# Patient Record
Sex: Female | Born: 1980 | Race: White | Hispanic: No | Marital: Married
Health system: Southern US, Community
[De-identification: ages and names within clinical notes are randomized; demographics above are authoritative.]

## PROBLEM LIST (undated history)

## (undated) DIAGNOSIS — R7301 Impaired fasting glucose: Secondary | ICD-10-CM

## (undated) DIAGNOSIS — I10 Essential (primary) hypertension: Secondary | ICD-10-CM

## (undated) DIAGNOSIS — E785 Hyperlipidemia, unspecified: Secondary | ICD-10-CM

## (undated) DIAGNOSIS — R740 Nonspecific elevation of levels of transaminase and lactic acid dehydrogenase [LDH]: Secondary | ICD-10-CM

## (undated) DIAGNOSIS — E669 Obesity, unspecified: Secondary | ICD-10-CM

## (undated) DIAGNOSIS — F32A Depression, unspecified: Secondary | ICD-10-CM

## (undated) DIAGNOSIS — G8929 Other chronic pain: Secondary | ICD-10-CM

## (undated) DIAGNOSIS — I679 Cerebrovascular disease, unspecified: Secondary | ICD-10-CM

## (undated) DIAGNOSIS — F329 Major depressive disorder, single episode, unspecified: Secondary | ICD-10-CM

## (undated) DIAGNOSIS — I639 Cerebral infarction, unspecified: Secondary | ICD-10-CM

## (undated) DIAGNOSIS — G4733 Obstructive sleep apnea (adult) (pediatric): Secondary | ICD-10-CM

## (undated) DIAGNOSIS — G709 Myoneural disorder, unspecified: Secondary | ICD-10-CM

## (undated) DIAGNOSIS — F419 Anxiety disorder, unspecified: Secondary | ICD-10-CM

## (undated) DIAGNOSIS — K2210 Ulcer of esophagus without bleeding: Secondary | ICD-10-CM

## (undated) DIAGNOSIS — K219 Gastro-esophageal reflux disease without esophagitis: Secondary | ICD-10-CM

## (undated) DIAGNOSIS — M549 Dorsalgia, unspecified: Secondary | ICD-10-CM

## (undated) DIAGNOSIS — R011 Cardiac murmur, unspecified: Secondary | ICD-10-CM

## (undated) HISTORY — DX: Other chronic pain: G89.29

## (undated) HISTORY — DX: Gastro-esophageal reflux disease without esophagitis: K21.9

## (undated) HISTORY — DX: Essential (primary) hypertension: I10

## (undated) HISTORY — DX: Cerebral infarction, unspecified: I63.9

## (undated) HISTORY — DX: Obstructive sleep apnea (adult) (pediatric): G47.33

## (undated) HISTORY — DX: Dorsalgia, unspecified: M54.9

## (undated) HISTORY — DX: Anxiety disorder, unspecified: F41.9

## (undated) HISTORY — DX: Depression, unspecified: F32.A

## (undated) HISTORY — DX: Cardiac murmur, unspecified: R01.1

## (undated) HISTORY — DX: Ulcer of esophagus without bleeding: K22.10

## (undated) HISTORY — DX: Obesity, unspecified: E66.9

## (undated) HISTORY — DX: Major depressive disorder, single episode, unspecified: F32.9

## (undated) HISTORY — DX: Nonspecific elevation of levels of transaminase and lactic acid dehydrogenase (ldh): R74.0

## (undated) HISTORY — DX: Impaired fasting glucose: R73.01

## (undated) HISTORY — DX: Cerebrovascular disease, unspecified: I67.9

## (undated) HISTORY — DX: Myoneural disorder, unspecified: G70.9

## (undated) HISTORY — DX: Hyperlipidemia, unspecified: E78.5

---

## 2000-08-01 ENCOUNTER — Encounter: Payer: Self-pay | Admitting: Surgery

## 2000-08-01 ENCOUNTER — Observation Stay (HOSPITAL_COMMUNITY): Admission: EM | Admit: 2000-08-01 | Discharge: 2000-08-02 | Payer: Self-pay

## 2000-12-27 ENCOUNTER — Ambulatory Visit (HOSPITAL_COMMUNITY): Admission: RE | Admit: 2000-12-27 | Discharge: 2000-12-27 | Payer: Self-pay | Admitting: Internal Medicine

## 2000-12-27 ENCOUNTER — Encounter: Payer: Self-pay | Admitting: Internal Medicine

## 2001-04-29 ENCOUNTER — Ambulatory Visit (HOSPITAL_COMMUNITY): Admission: RE | Admit: 2001-04-29 | Discharge: 2001-04-29 | Payer: Self-pay | Admitting: Family Medicine

## 2001-05-02 ENCOUNTER — Encounter: Payer: Self-pay | Admitting: Family Medicine

## 2001-05-02 ENCOUNTER — Ambulatory Visit (HOSPITAL_COMMUNITY): Admission: RE | Admit: 2001-05-02 | Discharge: 2001-05-02 | Payer: Self-pay | Admitting: Family Medicine

## 2001-07-01 ENCOUNTER — Encounter: Payer: Self-pay | Admitting: Family Medicine

## 2001-07-01 ENCOUNTER — Ambulatory Visit (HOSPITAL_COMMUNITY): Admission: RE | Admit: 2001-07-01 | Discharge: 2001-07-01 | Payer: Self-pay | Admitting: Family Medicine

## 2002-04-18 ENCOUNTER — Encounter (HOSPITAL_COMMUNITY): Admission: RE | Admit: 2002-04-18 | Discharge: 2002-05-18 | Payer: Self-pay | Admitting: Preventative Medicine

## 2002-05-02 ENCOUNTER — Ambulatory Visit (HOSPITAL_COMMUNITY): Admission: RE | Admit: 2002-05-02 | Discharge: 2002-05-02 | Payer: Self-pay | Admitting: Preventative Medicine

## 2002-05-02 ENCOUNTER — Encounter: Payer: Self-pay | Admitting: Preventative Medicine

## 2002-10-18 ENCOUNTER — Encounter (HOSPITAL_COMMUNITY): Admission: RE | Admit: 2002-10-18 | Discharge: 2002-11-17 | Payer: Self-pay | Admitting: Internal Medicine

## 2003-06-29 ENCOUNTER — Emergency Department (HOSPITAL_COMMUNITY): Admission: EM | Admit: 2003-06-29 | Discharge: 2003-06-29 | Payer: Self-pay | Admitting: Emergency Medicine

## 2003-07-04 ENCOUNTER — Ambulatory Visit (HOSPITAL_COMMUNITY): Admission: RE | Admit: 2003-07-04 | Discharge: 2003-07-04 | Payer: Self-pay | Admitting: Family Medicine

## 2003-07-24 ENCOUNTER — Ambulatory Visit (HOSPITAL_COMMUNITY): Admission: RE | Admit: 2003-07-24 | Discharge: 2003-07-24 | Payer: Self-pay | Admitting: Pulmonary Disease

## 2003-09-06 ENCOUNTER — Ambulatory Visit (HOSPITAL_COMMUNITY): Admission: RE | Admit: 2003-09-06 | Discharge: 2003-09-06 | Payer: Self-pay | Admitting: Pulmonary Disease

## 2004-04-04 ENCOUNTER — Ambulatory Visit (HOSPITAL_COMMUNITY): Admission: RE | Admit: 2004-04-04 | Discharge: 2004-04-04 | Payer: Self-pay | Admitting: Internal Medicine

## 2004-04-17 ENCOUNTER — Ambulatory Visit (HOSPITAL_COMMUNITY): Admission: RE | Admit: 2004-04-17 | Discharge: 2004-04-17 | Payer: Self-pay | Admitting: Internal Medicine

## 2004-04-22 ENCOUNTER — Ambulatory Visit (HOSPITAL_COMMUNITY): Admission: RE | Admit: 2004-04-22 | Discharge: 2004-04-22 | Payer: Self-pay | Admitting: Internal Medicine

## 2004-11-20 ENCOUNTER — Ambulatory Visit (HOSPITAL_COMMUNITY): Admission: RE | Admit: 2004-11-20 | Discharge: 2004-11-20 | Payer: Self-pay | Admitting: Internal Medicine

## 2006-08-31 ENCOUNTER — Emergency Department (HOSPITAL_COMMUNITY): Admission: EM | Admit: 2006-08-31 | Discharge: 2006-08-31 | Payer: Self-pay | Admitting: Emergency Medicine

## 2007-06-11 DIAGNOSIS — I679 Cerebrovascular disease, unspecified: Secondary | ICD-10-CM

## 2007-06-11 HISTORY — DX: Cerebrovascular disease, unspecified: I67.9

## 2007-06-13 ENCOUNTER — Ambulatory Visit: Payer: Self-pay | Admitting: Cardiology

## 2007-06-13 ENCOUNTER — Inpatient Hospital Stay (HOSPITAL_COMMUNITY): Admission: EM | Admit: 2007-06-13 | Discharge: 2007-06-16 | Payer: Self-pay | Admitting: Emergency Medicine

## 2007-07-03 DIAGNOSIS — I639 Cerebral infarction, unspecified: Secondary | ICD-10-CM

## 2007-09-05 ENCOUNTER — Emergency Department (HOSPITAL_COMMUNITY): Admission: EM | Admit: 2007-09-05 | Discharge: 2007-09-05 | Payer: Self-pay | Admitting: Emergency Medicine

## 2007-10-19 ENCOUNTER — Encounter (HOSPITAL_COMMUNITY): Admission: RE | Admit: 2007-10-19 | Discharge: 2007-11-18 | Payer: Self-pay | Admitting: Unknown Physician Specialty

## 2007-11-14 ENCOUNTER — Ambulatory Visit (HOSPITAL_COMMUNITY): Admission: RE | Admit: 2007-11-14 | Discharge: 2007-11-14 | Payer: Self-pay | Admitting: Internal Medicine

## 2008-07-02 ENCOUNTER — Ambulatory Visit: Payer: Self-pay | Admitting: Cardiology

## 2008-07-17 ENCOUNTER — Ambulatory Visit: Payer: Self-pay | Admitting: *Deleted

## 2008-10-02 ENCOUNTER — Ambulatory Visit: Payer: Self-pay | Admitting: Vascular Surgery

## 2008-10-08 ENCOUNTER — Ambulatory Visit (HOSPITAL_COMMUNITY): Admission: RE | Admit: 2008-10-08 | Discharge: 2008-10-08 | Payer: Self-pay | Admitting: Vascular Surgery

## 2008-10-08 ENCOUNTER — Ambulatory Visit: Payer: Self-pay | Admitting: Vascular Surgery

## 2009-01-03 ENCOUNTER — Ambulatory Visit (HOSPITAL_COMMUNITY): Admission: RE | Admit: 2009-01-03 | Discharge: 2009-01-03 | Payer: Self-pay | Admitting: Cardiology

## 2009-01-03 ENCOUNTER — Encounter (INDEPENDENT_AMBULATORY_CARE_PROVIDER_SITE_OTHER): Payer: Self-pay | Admitting: Cardiology

## 2009-03-20 ENCOUNTER — Emergency Department (HOSPITAL_COMMUNITY): Admission: EM | Admit: 2009-03-20 | Discharge: 2009-03-20 | Payer: Self-pay | Admitting: Emergency Medicine

## 2009-04-24 DIAGNOSIS — IMO0002 Reserved for concepts with insufficient information to code with codable children: Secondary | ICD-10-CM

## 2009-04-24 HISTORY — DX: Reserved for concepts with insufficient information to code with codable children: IMO0002

## 2009-05-10 HISTORY — PX: FOOT SURGERY: SHX648

## 2009-08-30 ENCOUNTER — Encounter: Admission: RE | Admit: 2009-08-30 | Discharge: 2009-08-30 | Payer: Self-pay | Admitting: Orthopedic Surgery

## 2010-11-20 LAB — POCT I-STAT, CHEM 8
BUN: 5 mg/dL — ABNORMAL LOW (ref 6–23)
Calcium, Ion: 1.24 mmol/L (ref 1.12–1.32)
Chloride: 105 mEq/L (ref 96–112)
Creatinine, Ser: 0.8 mg/dL (ref 0.4–1.2)
Glucose, Bld: 102 mg/dL — ABNORMAL HIGH (ref 70–99)
HCT: 41 % (ref 36.0–46.0)
Hemoglobin: 13.9 g/dL (ref 12.0–15.0)
Potassium: 4 mEq/L (ref 3.5–5.1)
Sodium: 142 mEq/L (ref 135–145)
TCO2: 26 mmol/L (ref 0–100)

## 2010-11-20 LAB — HEMOGLOBIN AND HEMATOCRIT, BLOOD
HCT: 34.4 % — ABNORMAL LOW (ref 36.0–46.0)
Hemoglobin: 11.8 g/dL — ABNORMAL LOW (ref 12.0–15.0)

## 2010-11-20 LAB — GLUCOSE, CAPILLARY: Glucose-Capillary: 83 mg/dL (ref 70–99)

## 2010-11-20 LAB — PREGNANCY, URINE: Preg Test, Ur: NEGATIVE

## 2010-12-02 ENCOUNTER — Encounter: Payer: Self-pay | Admitting: Family Medicine

## 2010-12-23 NOTE — Procedures (Signed)
CAROTID DUPLEX EXAM   INDICATION:  Dizziness and fatigue.   HISTORY:  Diabetes:  No.  Cardiac:  No.  Hypertension:  Yes.  Smoking:  No.  Previous Surgery:  No.  CV History:  TIA in 2008 and 2009.  Amaurosis Fugax No, Paresthesias No, Hemiparesis No.                                       RIGHT             LEFT  Brachial systolic pressure:         126               120  Brachial Doppler waveforms:         Within normal limits                Within normal limits  Vertebral direction of flow:        Antegrade         Antegrade  DUPLEX VELOCITIES (cm/sec)  CCA peak systolic                   163               168  ECA peak systolic                   186               149  ICA peak systolic                   120               142  ICA end diastolic                   30                47  PLAQUE MORPHOLOGY:                  None              None  PLAQUE AMOUNT:                      None              None  PLAQUE LOCATION:                    None              None   IMPRESSION:  Duplex velocities suggest 20-30 % bilateral internal  carotid artery stenosis.   ___________________________________________  Di Kindle. Edilia Bo, M.D.   AC/MEDQ  D:  10/02/2008  T:  10/02/2008  Job:  [id number]

## 2010-12-23 NOTE — H&P (Signed)
[name], Sara               ACCOUNT NO.:  [id number]   MEDICAL RECORD NO.:  [id number]          PATIENT TYPE:  INP   LOCATION:  IC07                          FACILITY:  [facility]   PHYSICIAN:  Thomasenia Bottoms, MDDATE OF BIRTH:  09-Dec-1980   DATE OF ADMISSION:  06/13/2007  DATE OF DISCHARGE:  LH                              HISTORY & PHYSICAL   CONTINUATION   SOCIAL HISTORY:  She does not smoke cigarettes, drink alcohol or use any  illicit drugs.  She works as a Occupational psychologist.   FAMILY HISTORY:  Significant for no history of CVA or cancer.  There is  a history of hypertension and MI in the family.  No history of blood  clots or miscarriages in the patient or her family that she is aware.   Vital signs on arrival showed her temperature was 97.1, blood pressure  115/71, pulse 65, respiratory rate 18, O2 saturations 99% on room air.   REVIEW OF SYSTEMS:  CONSTITUTIONAL:  She denies any night sweats.  No  weight loss.  Appetite is unchanged and it is good.  HEENT:  She has had  a mild headache over the last 3 days.  She reports maybe 1-2 mild  headaches a week.  She did have the sore throat, but that has  completely resolved.  CARDIOVASCULAR:  No chest pains.  No lower  extremity edema.  RESPIRATORY:  No hemoptysis.  No shortness of breath.  No wheezing.  INTEGUMENTARY:  No open lesions or rashes.  MUSCULOSKELETAL:  No significant joint pains.  GI:  No diarrhea,  constipation, abdominal pain.  No nausea, no vomiting.  She has not  vomited any blood or seen any blood in stools.  GU:  No hematuria.  GYN:  She says her periods are regular.  She does not use any of oral  contraception.   PHYSICAL EXAMINATION:  GENERAL:  She is a young woman, well-nourished,  well-developed in no acute distress.  HEENT:  Normocephalic, atraumatic.  Pupils are equal round and briskly  reactive to light, direct and consensual.  Sclerae nonicteric.  Oral  mucosa moist.  NECK:   Supple.  No lymphadenopathy, no thyromegaly, no jugular venous  distention.  CARDIAC:  Regular rate and rhythm with no murmurs, gallops or rubs.  LUNGS:  Clear to auscultation bilaterally with no wheezes, rhonchi or  rales.  ABDOMEN:  Soft, nontender, nondistended.  Normoactive bowel sounds.  No  masses are appreciated.  EXTREMITIES:  No evidence of clubbing, cyanosis or edema.  She has  palpable DP pulses bilaterally.  Her skin is warm and dry.  NEUROLOGIC:  She is alert and oriented x3.  She does have some slurred speech and  maybe just a very mild facial droop on the right.  She has pronator  drift on the right.  She has reduced finger squeeze on the right  compared to the left, which is 5/5.  Her left upper extremity and left  lower extremity both have 5/5 strength through out.  On the right, she  has diminished plantar flexion of the right  ankle, diminished finger  squeeze, diminished wrist flexion and extension as well.  She denies any  paresthesias.  She has a decreased proximal strength also in the  pronator drift on the right.  Babinski reflexes are equivocal.  SKIN:  Intact with no open lesions or rashes.  She does have a couple of  tattoos.  MUSCULOSKELETAL:  No effusions of her joints.  She has excellent range  of motion of her neck without any tenderness.   LABORATORY DATA AND X-RAY FINDINGS:  White blood cell count 7.4,  hemoglobin 12.3, hematocrit 36.8, platelet count is 351.  INR is 1.0.  Urinalysis is essentially within normal limits and negative nitrite,  negative leukocyte esterase.  Pregnancy test is negative.  Sodium is  139, potassium 3.9, chloride 105, bicarb 28, glucose 100, BUN 6,  creatinine 0.75.  AST 17, ALT 17.   The patient's head CT reveals no acute abnormality.   ASSESSMENT/PLAN:  Possible cerebrovascular accident in this 30 year old.  Her exam certainly is consistent with slurred speech and right arm and  leg weakness.  The plan is to admit her to a  telemetry bed overnight.  We will get an MRI of her brain.  Will check hypercoagulable studies  given her young age.  Carotids and echocardiogram as well.  Also, would  consider getting a neurology consult on this patient given her young  age.  If not cerebrovascular accident, this could possibly be an  atypical migraine given the fact that the patient has headaches weekly,  but we will await the MRI and go from there.  The patient does not have  any significant risk factors for cerebrovascular accident that I can  tell at this time.  She says when she was pregnant, her blood pressure  was quite high, but that has not really been the case so far.  We will  keep an eye on her blood pressure.  She does not have any history of  blood clots or miscarriages.  She does not smoke cigarettes.      Thomasenia Bottoms, MD  Electronically Signed     CVC/MEDQ  D:  06/13/2007  T:  06/14/2007  Job:  [id number]   cc:   Madelin Rear. Sherwood Gambler, MD  Fax: [phone]   Corrie Mckusick, M.D.  Fax: [phone]

## 2010-12-23 NOTE — H&P (Signed)
[name], HEADINGS               ACCOUNT NO.:  [id number]   MEDICAL RECORD NO.:  [id number]          PATIENT TYPE:  INP   LOCATION:  IC07                          FACILITY:  [facility]   PHYSICIAN:  Thomasenia Bottoms, MDDATE OF BIRTH:  1980-12-05   DATE OF ADMISSION:  06/13/2007  DATE OF DISCHARGE:  LH                              HISTORY & PHYSICAL   CHIEF COMPLAINT:  Slurred speech.   HISTORY OF PRESENT ILLNESS:  [name] is a 30 year old woman who has  had slurred speech for 3 days at the time of arrival. She also has had  mild right arm and right leg weakness for 2 days. The patient had an  upper respiratory infection mostly consistent of some fevers and sore  throat last week and has been on azithromycin. She was concerned  actually that the antibiotics were causing her symptoms. She called her  doctor's office today. Once she described the symptoms, her doctor had  her come to the emergency department. The patient completed the last day  of her antibiotics yesterday, and the symptoms still persist. She  reports that she has had a mild headache as well. Her fevers have  completely resolved. Her sore throat has completely resolved. She had a  mild herpetic lesion on her hip which has resolved as well.   The patient's past medical history is significant for no medical  problems, no surgeries.   She takes no medications. She was taking some NyQuil and Thera-Flu in  addition to the   Dictation ended at this point.      Thomasenia Bottoms, MD  Electronically Signed     CVC/MEDQ  D:  06/13/2007  T:  06/14/2007  Job:  [id number]

## 2010-12-23 NOTE — Consult Note (Signed)
VASCULAR SURGERY CONSULTATION   [name], [name]  DOB:  [birth year]                                       07/17/2008  CHART#:[id number]   I saw the patient in the office today in consultation concerning  bilateral moderate carotid disease.  This is a pleasant 30 year old  right-handed woman who on November 23 had the sudden onset of some  dysarthria and mild right-sided facial drooping.  This lasted a little  over 24 hours and has completely resolved.  She had no previous history  of stroke, TIAs, expressive or receptive aphasia or amaurosis fugax.  She was admitted at [facility] and her workup included an MRI of  the head which showed no evidence of acute intracranial abnormality.  CT  of the head also showed no acute intracranial abnormality.  Her carotid  duplex scan showed mild bilateral carotid disease.  Based on velocity  criteria she had a 40-59% internal carotid artery stenosis bilaterally.  She had no further symptoms since this admission.   PAST MEDICAL HISTORY:  Significant for hypertension and  hypercholesterolemia.  She denies any history of diabetes, history of  previous myocardial infarction, history of congestive heart failure or  history of COPD.   FAMILY HISTORY:  Her grandparents on both sides had heart attacks at age  64 or less.   SOCIAL HISTORY:  She is single.  She has one child.  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  She does not smoke cigarettes.   REVIEW OF SYSTEMS AND MEDICATIONS:  Are documented on the medical  history form in her chart.   PHYSICAL EXAMINATION:  General:  This is a pleasant 30 year old woman  who appears her stated age.  Vital signs:  Blood pressure is 122/74,  heart rate is 81.  HEENT:  Unremarkable.  Neck:  Supple.  There is no  cervical lymphadenopathy.  I do not detect any carotid bruits.  Lungs:  Clear bilaterally to auscultation.  Cardiac:  She has a regular rate and  rhythm.  Abdomen:  Is obese  and difficult to assess.  I cannot palpate  an aneurysm.  She has warm, well-perfused feet without ischemic ulcers.  She has no significant lower extremity swelling or significant  varicosities.  Neurological:  She has good strength in her upper  extremities and lower extremities bilaterally.   I have reviewed her carotid duplex scan which shows bilateral 40-59%  carotid stenoses.   I have explained that certainly I would not recommend a carotid  endarterectomy based on the duplex findings as she has only mild carotid  disease bilaterally in the 40-59% range.  It is possible that the left  carotid stenosis could have caused, could explain her symptoms although  the only way to work this up further would be a cerebral arteriogram  which is associated with a 1-2% risk of stroke.  All things considered,  I would recommend a followup duplex scan in 6 months and not consider  cerebral arteriography unless she developed new neurologic symptoms.  We  have reviewed the potential symptoms of cerebrovascular disease.  She  has also been started on aspirin and Zocor and we have discussed the  importance of continuing her aspirin and the importance of continued  close followup of her blood pressure and hypercholesterolemia.  She is  certainly extremely young  to be having carotid disease and we will have  to continue to follow her carotid disease closely.   I plan on seeing her back in 6 months with a followup duplex scan.  She  knows to call sooner if she has problems.  In the meantime she knows to  continue taking her aspirin and Zocor.   Di Kindle. Edilia Bo, M.D.  Electronically Signed  CSD/MEDQ  D:  07/17/2008  T:  07/18/2008  Job:  [id number]   cc:   Madelin Rear. Sherwood Gambler, MD

## 2010-12-23 NOTE — Assessment & Plan Note (Signed)
OFFICE VISIT   [name], [name]  DOB:  1980/11/07                                       10/02/2008  CHART#:[id number]   I saw the patient in the office today for continued followup of her  carotid disease.  I had originally seen her in consultation on  07/17/2008 when she was found to have bilateral moderate carotid  disease.  In November of that year she had the sudden onset of some  dysarthria and mild right facial drooping which lasted over 24 hours and  then completely resolved.  She also says that in 2008 she had some  transient left arm weakness which lasted about 2 months.  She has had no  amaurosis fugax.  As part of her workup in November she had a carotid  duplex scan which showed moderate internal carotid artery stenoses  bilaterally in the 40-59% range.   Since I saw her last in December she states she has been having  intermittent episodes of weakness in the left arm and it usually occurs  one to times a week.  This does not appear to be related to any  particular position of her arms and not any aggravating or alleviating  factors.  She has had no neck pain.  She also states she has had a  couple episodes of dizziness that usually last about an hour.   PAST MEDICAL HISTORY:  Significant for hypertension and  hypercholesterolemia.  She denies any history of diabetes, history of  previous myocardial infarction or history of congestive heart failure.   FAMILY HISTORY:  Both of her grandparents had premature cardiovascular  disease.   SOCIAL HISTORY:  She is single.  She has one child.  She does not use  tobacco.   REVIEW OF SYSTEMS:  She denies any chest pain, chest pressure,  palpitations or arrhythmias.  She has had no productive cough  bronchitis, asthma or wheezing.  She has had no claudication, rest pain  or nonhealing ulcers.   PHYSICAL EXAMINATION:  General:  This is a pleasant 30 year old woman  who appears her stated age.  She  has moderate obesity.  Vital signs:  Blood pressure is 101/68, heart rate is 71.  Neck:  Supple.  There is no  cervical lymphadenopathy.  I do not detect any carotid bruits.  Lungs:  Clear bilaterally to auscultation.  Cardiac:  She has a regular rate and  rhythm.  She has palpable femoral pulses and warm, well-perfused feet.  Neurological:  Exam is nonfocal.   Carotid duplex scan in our office today shows a less than 39% right  carotid stenosis and a 40-59% left carotid stenosis in the lower end of  that range.   There has been no significant change in her carotid disease compared to  her studies previously.  However, given that she is having intermittent  numbness in the left arm for no good reason I recommend we proceed with  cerebral arteriography to be sure there is not an ulceration that we are  missing that could potentially increase her risk of stroke.  We have  discussed the indications for cerebral arteriography and the potential  complications including but not limited to bleeding, arterial injury,  and stroke (periprocedural risk 1-2%).  She does not appear to have any  symptoms consistent with cervical disk disease and  has no evidence of  significant subclavian artery stenosis in the left upper extremity.  Her  arteriogram has been scheduled for March 1 and will make further  recommendations pending these results.  I did explain to her that I did  not think her dizziness could be attributed to her mild cerebrovascular  disease.  Both vertebral arteries are patent with normally directed flow  and her arm pressures are equal.  She has no evidence of subclavian  steal or significant for vertebral artery disease.   Sara [name]. Sara [name], M.D.  Electronically Signed   CSD/MEDQ  D:  10/02/2008  T:  10/03/2008  Job:  [id number]   cc:   Sara [name]. Sara Gambler, MD

## 2010-12-23 NOTE — Op Note (Signed)
Sara [name], Sara [name]               ACCOUNT NO.:  [id number]   MEDICAL RECORD NO.:  [id number]          PATIENT TYPE:  AMB   LOCATION:  SDS                          FACILITY:  [facility]   PHYSICIAN:  Di Kindle. Edilia Bo, M.D.DATE OF BIRTH:  30-Mar-1981   DATE OF PROCEDURE:  10/08/2008  DATE OF DISCHARGE:  10/08/2008                               OPERATIVE REPORT   PREOPERATIVE DIAGNOSIS:  Mild carotid disease.   POSTOPERATIVE DIAGNOSIS:  Intermittent left arm paresthesias with no  significant carotid disease noted.   PROCEDURE:  1. Ultrasound-guided access to the right common femoral artery.  2. Arch aortogram.  3. Selective innominate arteriogram.  4. Selective right common carotid arteriogram with both extracranial      and intracranial views.  5. Selective left common carotid arteriogram with both intracranial      and extracranial views.  6. Selective left subclavian arteriogram with left upper extremity      arteriogram.   SURGEON:  Di Kindle. Edilia Bo, MD   ANESTHESIA:  Local.   INDICATIONS:  This is a pleasant 30 year old woman who had been having  some repeated episodes of weakness in her left arm that easily occurred  one to two times a week.  This did not appear to be related to any  positional maneuvers of her arm.  There really no aggravating or  alleviating factor.  She had had no neck pain to suggest cervical disk  disease.  She had a carotid duplex scan which showed in November 2009  bilateral 40-59% carotid stenoses.  However, most recent duplex scans  showed a left 39% right carotid stenosis with 40-59% left carotid  stenosis.  She was brought in for cerebral arteriography to rule out an  ulceration which could be potentially causing embolic symptoms and  potentially right hemispheric TIAs.   TECHNIQUE:  The patient was taken to the [facility] and was not sedated.  The groins were prepped and draped in the usual sterile fashion.  After  the skin was  infiltrated with 1% lidocaine under ultrasound guidance the  right common femoral artery was cannulated and a guidewire introduced  into the infrarenal aorta.  A 5-French sheath was introduced over the  wire.  A long pigtail catheter was positioned in the ascending aortic  arch and an aortic arch injection made at 40-degree LAO projection.  The  innominate artery was then cannulated with Berenstein II catheter and an  innominate injection made at 30-degree RAO projection.  The wire was  then advanced into the right common carotid artery and the catheter  advanced into the right common carotid artery and selective right common  carotid arteriogram obtained with both intracranial and extracranial  views.  Next, the catheter was brought back into the arch and the left  common carotid artery was cannulated and the wire advanced then the  catheter advanced to the left common carotid artery and selective left  common carotid arteriogram obtained.  Next, the left subclavian artery  was selectively cannulated and selective left subclavian arteriogram  obtained with left upper extremity arteriogram obtained.   FINDINGS:  There is no significant disease of the aortic arch.  The  innominate, right subclavian, right vertebral, and right common carotid  artery are all widely patent.  The left subclavian, left vertebral, and  left common carotid artery are widely patent.  There is no significant  atherosclerotic disease of the carotid bifurcation on the right and the  external and internal carotid arteries have no significant disease.  Likewise, on the left side, the external and internal carotids are  widely patent with no significant bifurcation disease.  Intracranial  views will be dictated separately by the neuroradiologist.  The left  subclavian is widely patent down to the wrist with a patent radial and  ulnar artery.   CONCLUSIONS:  No significant extracranial carotid disease.  No  significant  left upper extremity occlusive disease noted.      Di Kindle. Edilia Bo, M.D.  Electronically Signed     CSD/MEDQ  D:  10/08/2008  T:  10/08/2008  Job:  [id number]   cc:   Madelin Rear. Sherwood Gambler, MD

## 2010-12-23 NOTE — Group Therapy Note (Signed)
Sara [name], Sara [name]               ACCOUNT NO.:  [id number]   MEDICAL RECORD NO.:  [id number]          PATIENT TYPE:  INP   LOCATION:  IC07                          FACILITY:  [facility]   PHYSICIAN:  Dorris Singh, DO    DATE OF BIRTH:  [birth year]   DATE OF PROCEDURE:  06/14/2007  DATE OF DISCHARGE:                                 PROGRESS NOTE   Patient seen today after being admitted for possible stroke.  Currently  she is still complaining of right-sided weakness  she had a CT of the  head on November 3 which shows no evidence of acute hemorrhagic  hydrocephalus or no acute intracranial abnormalities, still pending MR.   At this point in time her vitals are stable.  Her blood pressure is  109/48, temperature 98.2, pulse 62, respirations 17.  GENERALLY:  This is a 30 year old female who is complaining of right-  sided weakness, she is able to talk.  There is no dramatic facial droop  at this point in time.  HEART:  Regular rate and rhythm, no murmurs, clicks or rubs.  LUNGS:  Clear to auscultation bilaterally.  ABDOMEN:  Soft, nontender, nondistended.  EXTREMITIES:  Positive right-sided weakness with strength testing on  upper and lower extremity.  Cranial nerves II-XII however are grossly  intact and equal bilaterally.   ASSESSMENT:  Right-sided weakness.   PLAN:  Will continue to monitor patient, will move patient from AICU but  will keep her on a monitored bed instead of ICU bed.  Await her MRI  results.  Will also have Dr. [name] consult and participate to get any  recommendations that he may have.  Also, she has a 2D echo pending.  Will continue to monitor patient and change therapy as appropriate.   LABS:  Her labs CBC and CMP, D-dimer are all within normal limits as  well as her UA.      Dorris Singh, DO  Electronically Signed     CB/MEDQ  D:  06/14/2007  T:  06/14/2007  Job:  [id number]

## 2010-12-23 NOTE — Procedures (Signed)
NAMEMELVINE, JULIN               ACCOUNT NO.:  [id number]   MEDICAL RECORD NO.:  [id number]          PATIENT TYPE:  INP   LOCATION:  A225                          FACILITY:  [facility]   PHYSICIAN:  Gerrit Friends. Dietrich Pates, MD, FACCDATE OF BIRTH:  Jan 10, 1981   DATE OF PROCEDURE:  06/14/2007  DATE OF DISCHARGE:                                ECHOCARDIOGRAM   REFERRING:  Thomasenia Bottoms, MD   CLINICAL DATA:  A 30 year old woman with CVA and murmur.   M-mode:  Aorta 2.7, left atrium 3.7, septum 1.1, posterior wall 0.9, LV  diastole 3.9, LV systole 3.1.   1. Technically adequate echocardiographic study.  2. Normal left atrium, right atrium and right ventricle.  3. Normal and trileaflet aortic valve; normal mitral and tricuspid      valves; physiologic tricuspid regurgitation.  4. Pulmonic valve and proximal pulmonary artery are poorly imaged but      appear grossly normal.  5. Normal internal dimension, wall thickness, regional and global      function of the left ventricle.  6. IVC poorly imaged but is normal in diameter.  7. Contrast study is negative for right-to-left passage of contrast.      Gerrit Friends. Dietrich Pates, MD, [facility]  Electronically Signed     RMR/MEDQ  D:  06/14/2007  T:  06/15/2007  Job:  [id number]

## 2010-12-23 NOTE — Consult Note (Signed)
Sara [name], Sara [name]               ACCOUNT NO.:  [id number]   MEDICAL RECORD NO.:  [id number]          PATIENT TYPE:  INP   LOCATION:  A225                          FACILITY:  [facility]   PHYSICIAN:  Kofi A. [name], M.D. DATE OF BIRTH:  [birth year]   DATE OF CONSULTATION:  DATE OF DISCHARGE:                                 CONSULTATION   ADDENDUM:  The patient continues to have complaints of right-sided  hemiparesis.  She also complains of significant tingling involving the  medial aspect of the right knee and right leg.  MRI done is negative for  any intraaxial process.  There are no compressive lesions.  Essentially  negative scan.  Vitamin B12 level, SV 41, TSH 0.8 and RPR nonreactive.  lupus anticoagulation negative, protein S 150 with this high protein C  189 also high.  Homocystine 8.4, ASR 20.  Lipid profile:  Total  cholesterol of 181, HDL 53, LDL 117, triglycerides 56, hemoglobin A1c  5.9.   ASSESSMENT AND PLAN:  The patient essentially has had negative workup  with no explanation for her symptoms of dysarthria and right-sided  hemiparesis.  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  We recommend physical therapy and EEG, but this  can be done in an outpatient setting.  She should follow up in our  office in the next 2 weeks.      Kofi A. [name], M.D.  Electronically Signed     KAD/MEDQ  D:  06/16/2007  T:  06/16/2007  Job:  [id number]

## 2010-12-23 NOTE — Consult Note (Signed)
NAMEKISMET, FACEMIRE               ACCOUNT NO.:  [id number]   MEDICAL RECORD NO.:  [id number]          PATIENT TYPE:  INP   LOCATION:  A225                          FACILITY:  [facility]   PHYSICIAN:  Kofi A. [name], M.D. DATE OF BIRTH:  09/03/1980   DATE OF CONSULTATION:  06/14/2007  DATE OF DISCHARGE:                                 CONSULTATION   NEUROLOGICAL CONSULTATION.   HISTORY OF PRESENT ILLNESS:  This is a 30 year old White female who  apparently had some constitutional symptoms of fever and sore throat  about a week ago.  She was started on azithromycin.  She may have had  some mild headache at that time.  She essentially has resolution of her  symptoms with no headaches but about 3 days ago, she developed slurred  speech and right sided numbness and weakness.  The patient does not  recall having any insect bites or travel outside the country.  Again,  she does not have headaches at this time and she has been afebrile.   PAST MEDICAL HISTORY:  Unremarkable.   ADMISSION MEDICATIONS:  Nyquil.   SOCIAL HISTORY:  She works as a Occupational psychologist.  She  grew up in the [place] area.  No history of tobacco, alcohol, or  illicit drug use.   FAMILY HISTORY:  Significant for hypertension and myocardial infarction.  No history of miscarriages or blot clots.   REVIEW OF SYSTEMS:  Essentially unremarkable other than as stated in  history of present illness, unchanged from Dr. Bonnell Public done on  06/13/2007.   PHYSICAL EXAMINATION:  VITAL SIGNS:  Temperature 96.8, pulse 6-7,  respirations 20, blood pressure 134/69.  HEENT:  Neck is supple, head is  normocephalic, atraumatic.  ABDOMEN:  Soft.  EXTREMITIES:  No  significant edema.  MENTATION:  The patient is awake, alert.  She  converses well.  Speech, language and cognition are intact.  CRANIAL  NERVE EVALUATION:  Pupils are 5 mm and brisk reactive.  Extraocular  muscles are intact.  Visual fields are full.  The  patient's muscle  strength is symmetric.  Funduscopic examination shows healthy discs and  sharp disc margins although I could not clearly see spontaneous visual  pulsations.  Tongue is midline, uvula midline.  Shoulder shrug normal.  Motor examination shows normal tone, bulk, and strength.  Coordination:  There are no tremors, past pointing or dysmetria.  No parkinsonism.  Reflexes are pathologically brisk with 4 beats of clonus in the legs.  Toes are downgoing, however, reflexes are also somewhat brisk in the  upper extremities.  Sensation normal to temperature and light touch.   IMAGING STUDIES:  MRI of the brain essentially shows nothing acute.  There is 1 tiny little white matter hyperintensity noted in the left  frontal area.  Otherwise, I see nothing of any consequence or  significance.   LABORATORY:  Evaluations have all been essentially unrevealing.  Urinalysis negative.  Comprehensive metabolic profile essentially  unrevealing other than a slightly high glucose of 100.  WBC 7.4,  hemoglobin 12, platelet count of 351.   ASSESSMENT:  Subacute  dysarthria and right sided hemiparesis of unclear  etiology.  She does seem to have some pathologically brisk reflexes,  worse as far as cervical myelopathy.  That, however, would not explain  the slurred speech.  Other potential for seriousness include  psychosomatic processes.   RECOMMENDATIONS:  1. Stroke.  2. L-spine MRI.  3. Additional blood testing for RPR, homocystine level, vitamin B12      level and sed rate.   Thanks for this consultation.  2.      Kofi A. [name], M.D.  Electronically Signed     KAD/MEDQ  D:  06/15/2007  T:  06/15/2007  Job:  [id number]

## 2010-12-23 NOTE — Discharge Summary (Signed)
Sara [name], LEISINGER               ACCOUNT NO.:  [id number]   MEDICAL RECORD NO.:  [id number]          PATIENT TYPE:  INP   LOCATION:  A225                          FACILITY:  [facility]   PHYSICIAN:  Osvaldo Shipper, MD     DATE OF BIRTH:  1980-12-29   DATE OF ADMISSION:  06/13/2007  DATE OF DISCHARGE:  11/06/2008LH                               DISCHARGE SUMMARY   Patient does not have a PMD.  She will need to be followed by Dr.  Gerilyn [name] as an outpatient.   DISCHARGE DIAGNOSES:  1. Dysarthria and right hemiparesis of unclear etiology.  2. Overweight.   Please see H&P dictated by Dr. Buena Irish regarding patient's  presenting illness.   BRIEF HOSPITAL COURSE:  This is a 30 year old African-American female  who presented with a three day history of slurred speech and right-sided  weakness.  The patient does not have any other medical problems.  She is  a little bit overweight.  Patient was clinically found to have right-  sided weakness and was having speech abnormality.  She underwent a CT of  the head which did not show any acute abnormality.  She subsequently  underwent MRI of the brain, which also did not show stroke.  Carotid  Doppler did not show significant stenosis.  She was seen by Dr.  Gerilyn [name], who ordered a MRI of the C-spine, which was also unremarkable  and could not explain patient's symptoms.  She underwent a partial  hypercoagulable workup, which was all negative.  Her lipid profile shows  her LDL was 117, and HDL was 53.  TSH was normal.  Homocysteine was  normal.   It is felt at this time, based on the above workup, all of which have  been negative, that this could be a psychosomatic process.  The only  thing that has not been done is EEG, which can be done as an outpatient.  Echocardiogram was done, which did not show any significant  abnormalities.  Contrast study was done, which was negative for PFO.   PHYSICAL EXAMINATION:  This morning, the patient is  still having  dysarthria.  She is having weakness and burning sensation of the right  leg.  Otherwise, she does not have any other complaints.  Her vital signs are all pretty stable.  She is not hypertensive.  Her examination reveals mild weakness on the right side, which is not  consistent on re-examination.  She is unable to squeeze my fingers, but  she is able to lift her arm up and lift up a phone without any  difficulty.   In any case, we will set her up for outpatient physical therapy, let her  continue baby aspirin for now, and let her follow up with Dr. Gerilyn [name],  then further decisions can be made at that time.   DISCHARGE MEDICATIONS:  Aspirin 81 mg daily.   FOLLOW UP:  1. Dr. Gerilyn [name] in 2-3 weeks.  2. EEG as an outpatient.  3. Outpatient physical therapy.   DIET:  A heart-healthy diet.   PHYSICAL ACTIVITY:  We will have PT  see her before discharge to see if  she needs any assistive devices.   Please note that 35 minutes was spent on this discharge.   ADDENDUM: I was informed by the nurse after patient was discharged that  the family wanted to speak with me. I was busy on another floor seeing  patients and told the nurse to tell the family to wait. By the time I  came down to see them they had already left.      Osvaldo Shipper, MD  Electronically Signed     GK/MEDQ  D:  06/16/2007  T:  06/16/2007  Job:  [id number]   cc:   Darleen Crocker A. Sara [name], M.D.  Fax: [phone]

## 2010-12-26 NOTE — Consult Note (Signed)
NAMETOMISHA, REPPUCCI               ACCOUNT NO.:  [id number]   MEDICAL RECORD NO.:  [id number]          PATIENT TYPE:  WOC   LOCATION:  WOC                          FACILITY:  [facility]   PHYSICIAN:  Marin Roberts, MDDATE OF BIRTH:  Jul 01, 1981   DATE OF CONSULTATION:  10/16/2008  DATE OF DISCHARGE:                                 CONSULTATION   REQUESTING PHYSICIAN:  Di Kindle. Edilia Bo, MD   REASON FOR CONSULTATION:  Intracranial interpretation of bilateral  carotid arteriogram.   FINDINGS:  Intracranial imaging from a right common carotid injection  demonstrates normal appearance of the distal ICA.  The A1 and M1  segments are normal.  There is flash filling of an anterior  communicating artery.  A prominent posterior communicating artery is  seen.  This likely represents a fetal-type posterior cerebral artery  with some inflow seen from the left.  The dural sinuses fill normally.   Injection of the left common carotid artery demonstrates a normal distal  left internal carotid artery.  The A1 and M1 segments are normal.  MCA  and ACA branch vessels are unremarkable.  The dural sinuses fill  normally.   IMPRESSION:  1. No significant aneurysm, stenosis, or branch vessel occlusion.  2. Fetal-type right posterior cerebral artery.      Marin Roberts, MD  Electronically Signed     CM/MEDQ  D:  10/16/2008  T:  10/17/2008  Job:  [id number]
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
## 2011-05-19 LAB — CBC
HCT: 36.8
Hemoglobin: 12.3
RBC: 4.32
RDW: 12.1

## 2011-05-19 LAB — LIPID PANEL
Cholesterol: 181
HDL: 53
LDL Cholesterol: 117 — ABNORMAL HIGH
Total CHOL/HDL Ratio: 3.4
Triglycerides: 56
VLDL: 11

## 2011-05-19 LAB — URINALYSIS, ROUTINE W REFLEX MICROSCOPIC
Glucose, UA: NEGATIVE
Ketones, ur: NEGATIVE
pH: 6

## 2011-05-19 LAB — LUPUS ANTICOAGULANT PANEL
DRVVT: 49 — ABNORMAL HIGH (ref 36.1–47.0)
Lupus Anticoagulant: NOT DETECTED
PTT Lupus Anticoagulant: 48.7 (ref 36.3–48.8)
dRVVT Incubated 1:1 Mix: 43.7 (ref 36.1–47.0)

## 2011-05-19 LAB — PROTEIN S, TOTAL: Protein S Ag, Total: 143 % — ABNORMAL HIGH (ref 70–140)

## 2011-05-19 LAB — PROTEIN C, TOTAL: Protein C, Total: 103 % (ref 70–140)

## 2011-05-19 LAB — HEMOGLOBIN A1C
Hgb A1c MFr Bld: 5.9
Mean Plasma Glucose: 133

## 2011-05-19 LAB — FACTOR 5 LEIDEN

## 2011-05-19 LAB — APTT: aPTT: 29

## 2011-05-19 LAB — COMPREHENSIVE METABOLIC PANEL
BUN: 6
CO2: 28
Calcium: 9.5
Creatinine, Ser: 0.75
GFR calc non Af Amer: >60
Glucose, Bld: 100 — ABNORMAL HIGH

## 2011-05-19 LAB — DIFFERENTIAL
Eosinophils Absolute: 0
Lymphocytes Relative: 29
Lymphs Abs: 2.1
Neutrophils Relative %: 64

## 2011-05-19 LAB — PROTHROMBIN GENE MUTATION

## 2011-05-19 LAB — PROTIME-INR: Prothrombin Time: 13.9

## 2011-05-19 LAB — CULTURE, BLOOD (ROUTINE X 2)
Culture: NO GROWTH
Report Status: 11082008
Report Status: 11082008

## 2011-05-19 LAB — PROTEIN C ACTIVITY: Protein C Activity: 189 % — ABNORMAL HIGH (ref 75–133)

## 2011-05-19 LAB — PROTEIN S ACTIVITY: Protein S Activity: 150 % — ABNORMAL HIGH (ref 69–129)

## 2011-05-19 LAB — D-DIMER, QUANTITATIVE: D-Dimer, Quant: 0.31

## 2011-05-19 LAB — HOMOCYSTEINE: Homocysteine: 8.4

## 2011-05-19 LAB — RPR: RPR Ser Ql: NONREACTIVE

## 2011-05-19 LAB — TSH: TSH: 1.111

## 2011-07-07 ENCOUNTER — Other Ambulatory Visit: Payer: Self-pay | Admitting: Physician Assistant

## 2011-07-07 DIAGNOSIS — N63 Unspecified lump in unspecified breast: Secondary | ICD-10-CM

## 2011-10-19 ENCOUNTER — Encounter: Payer: Self-pay | Admitting: Gastroenterology

## 2011-11-03 ENCOUNTER — Ambulatory Visit: Payer: Self-pay | Admitting: Gastroenterology

## 2011-11-20 ENCOUNTER — Telehealth: Payer: Self-pay | Admitting: Gastroenterology

## 2011-11-20 NOTE — Telephone Encounter (Signed)
Message copied by Arna Snipe on Fri Nov 20, 2011  9:32 AM ------      Message from: Harlow Mares D      Created: Wed Nov 04, 2011  3:08 PM       Please bill pt for no show on 11/03/2011, per Dr Jarold Motto

## 2012-04-13 ENCOUNTER — Other Ambulatory Visit: Payer: Self-pay | Admitting: Family Medicine

## 2012-04-13 DIAGNOSIS — R0989 Other specified symptoms and signs involving the circulatory and respiratory systems: Secondary | ICD-10-CM

## 2012-04-19 ENCOUNTER — Other Ambulatory Visit (HOSPITAL_COMMUNITY): Payer: Medicare Other

## 2012-04-20 ENCOUNTER — Ambulatory Visit (HOSPITAL_COMMUNITY)
Admission: RE | Admit: 2012-04-20 | Discharge: 2012-04-20 | Disposition: A | Discharge status: Home / Self Care | Payer: Medicare Other | Source: Ambulatory Visit | Attending: Family Medicine | Admitting: Family Medicine

## 2012-04-20 DIAGNOSIS — R0989 Other specified symptoms and signs involving the circulatory and respiratory systems: Secondary | ICD-10-CM | POA: Insufficient documentation

## 2012-04-25 ENCOUNTER — Encounter: Payer: Self-pay | Admitting: Cardiology

## 2012-04-25 ENCOUNTER — Ambulatory Visit (INDEPENDENT_AMBULATORY_CARE_PROVIDER_SITE_OTHER): Payer: Medicare Other | Admitting: Cardiology

## 2012-04-25 ENCOUNTER — Encounter: Payer: Self-pay | Admitting: *Deleted

## 2012-04-25 VITALS — BP 122/68 | HR 68 | Ht 61.0 in | Wt 221.0 lb

## 2012-04-25 DIAGNOSIS — E785 Hyperlipidemia, unspecified: Secondary | ICD-10-CM | POA: Insufficient documentation

## 2012-04-25 DIAGNOSIS — I10 Essential (primary) hypertension: Secondary | ICD-10-CM | POA: Insufficient documentation

## 2012-04-25 DIAGNOSIS — R7301 Impaired fasting glucose: Secondary | ICD-10-CM

## 2012-04-25 DIAGNOSIS — I679 Cerebrovascular disease, unspecified: Secondary | ICD-10-CM

## 2012-04-25 DIAGNOSIS — E669 Obesity, unspecified: Secondary | ICD-10-CM | POA: Insufficient documentation

## 2012-04-25 DIAGNOSIS — E782 Mixed hyperlipidemia: Secondary | ICD-10-CM

## 2012-04-25 DIAGNOSIS — R011 Cardiac murmur, unspecified: Secondary | ICD-10-CM

## 2012-04-25 DIAGNOSIS — G4733 Obstructive sleep apnea (adult) (pediatric): Secondary | ICD-10-CM | POA: Insufficient documentation

## 2012-04-25 MED ORDER — ATORVASTATIN CALCIUM 80 MG PO TABS: 80.0000 mg | ORAL_TABLET | Freq: Every day | ORAL | Status: DC

## 2012-04-25 NOTE — Progress Notes (Signed)
[name], female   DOB: Apr 19, 1981, 31 y.o.   MRN: [id number]  HPI: Initial Cardiology evaluation performed at the kind request of Dr. Tanya Nones for her assessment of a systolic murmur and left carotid bruit.  This nice woman has enjoyed generally good health, but does have a number of cardiovascular risk factors including hyperlipidemia and hypertension.  She has no chest discomfort or exertional dyspnea.  She was recently noted to have a heart murmur plus auscultatory findings in the left neck thought to represent either a transmitted murmur or an arterial bruit.  Patient reports no neurologic symptoms, but an echocardiogram performed 3 years ago indicates that the study was ordered as a result of a TIA.  Patient reports normal development as a child and normal physical capabilities.  She has never been told of a heart problem nor has she previously been evaluated by a cardiologist.  Subsequent to office visit, medical records were obtained and reviewed.  Patient failed to note that she had been evaluated in 06/2007 for a reversible ischemic neurologic deficit with transient right-sided weakness and dysarthria.  Carotid ultrasound was interpreted as demonstrating bilateral 40-59% stenoses.  An echocardiogram was normal with no evidence for intracardiac shunt.  CT and MRI studies of the brain were normal.  She was evaluated by Dr. Edilia Bo eventually undergoing cerebral angiography that was also normal.  Carotid ultrasound performed 04/20/12 was interpreted as essentially normal.  Current Outpatient Prescriptions on File Prior to Visit  Medication Sig Dispense Refill  . aspirin 81 MG tablet Take 81 mg by mouth daily.        Marland Kitchen atorvastatin (LIPITOR) 40 MG tablet Take 40 mg by mouth daily.       . CYMBALTA 60 MG capsule Take 60 mg by mouth daily.       Marland Kitchen escitalopram (LEXAPRO) 10 MG tablet Take 10 mg by mouth daily.       Marland Kitchen gabapentin (NEURONTIN) 300 MG capsule as needed.       . Milnacipran  HCl (SAVELLA) 100 MG TABS Take 1 tablet by mouth daily.        . mirtazapine (REMERON) 15 MG tablet Take 15 mg by mouth at bedtime.        . ranitidine (ZANTAC) 300 MG tablet Take 300 mg by mouth daily before breakfast.        . TRI-SPRINTEC 0.18/0.215/0.25 MG-35 MCG tablet       . zolpidem (AMBIEN) 5 MG tablet Take 5 mg by mouth at bedtime as needed.         No Known Allergies    Past Medical History  Diagnosis Date  . Hyperlipidemia   . Obesity   . Obstructive sleep apnea     Insomnia  . Hypertension   . GERD (gastroesophageal reflux disease)   . Vitamin d deficiency   . Transaminase or LDH elevation 04/24/2009  . Cerebrovascular disease 2008    Carotid US 2008:mild-mod stenosis; presented with RIND-VII nerve palsy and dysarthria  . Chronic back pain     Past Surgical History  Procedure Date  . Foot surgery 05/2009    Dr Ethelene Hal    Family History  Problem Relation Age of Onset  . Diabetes Father   . Hypertension Father   . Multiple sclerosis Brother   . Asthma Other   . Cancer Mother   . Heart attack Mother   . Cancer Father     History   Social History  . Marital  Status: Single    Spouse Name: N/A    Number of Children: N/A  . Years of Education: N/A   Occupational History  . Not on file.   Social History Main Topics  . Smoking status: Never Smoker   . Smokeless tobacco: Not on file  . Alcohol Use: No  . Drug Use: No  . Sexually Active: Not on file   Other Topics Concern  . Not on file   Social History Narrative  . No narrative on file    ROS: Patient reports epigastric discomfort that she characterizes as constant and considers to represent dinyspepsia. Denies dyspnea, chest discomfort, orthopnea, PND, lightheadedness or syncope.  All other systems reviewed and are negative.  PHYSICAL EXAM: BP 122/68  Pulse 68  Ht 5\' 1"  (1.549 m)  Wt 100.245 kg (221 lb)  BMI 41.76 kg/m2  General-Well-developed; no acute distress Body  [place]-Overweight HEENT-[place]/AT; PERRL; EOM intact; conjunctiva and lids nl Neck-No JVD; Very apparent left carotid bruit vs transmitted murmur Endocrine-No thyromegaly Lungs-Clear lung fields; resonant percussion; normal I-to-E ratio; decreased breath sounds at the bases Cardiovascular- normal PMI; normal S1 and S2; grade 1-2/6 systolic ejection murmur over the upper portion of the sternum. Abdomen-BS normal; soft and non-tender without masses or organomegaly Musculoskeletal-No deformities, cyanosis or clubbing Neurologic-Nl cranial nerves; symmetric strength and tone Skin- Warm, no significant lesions Extremities-Nl distal pulses; no edema  EKG:  Normal sinus rhythm; borderline short PR interval; otherwise within normal limits.  ASSESSMENT AND PLAN:  [place] Bing, MD 04/25/2012 1:48 PM

## 2012-04-25 NOTE — Progress Notes (Deleted)
Name: Sara [name]    DOB: 09/12/1980  Age: 31 y.o.  MR#: [id number]       PCP:  Leo Grosser, MD      Insurance: @PAYORNAME @   CC:   No chief complaint on file.   VS BP 122/68  Pulse 68  Ht 5\' 1"  (1.549 m)  Wt 221 lb (100.245 kg)  BMI 41.76 kg/m2  Weights Current Weight  04/25/12 221 lb (100.245 kg)    Blood Pressure  BP Readings from Last 3 Encounters:  04/25/12 122/68     Admit date:  (Not on file) Last encounter with RMR:  Visit date not found   Allergy No Known Allergies  Current Outpatient Prescriptions  Medication Sig Dispense Refill  . aspirin 81 MG tablet Take 81 mg by mouth daily.        Marland Kitchen atorvastatin (LIPITOR) 40 MG tablet Take 40 mg by mouth daily.       . CYMBALTA 60 MG capsule Take 60 mg by mouth daily.       Marland Kitchen escitalopram (LEXAPRO) 10 MG tablet Take 10 mg by mouth daily.       Marland Kitchen gabapentin (NEURONTIN) 300 MG capsule as needed.       . Milnacipran HCl (SAVELLA) 100 MG TABS Take 1 tablet by mouth daily.        . mirtazapine (REMERON) 15 MG tablet Take 15 mg by mouth at bedtime.        Marland Kitchen oxyCODONE-acetaminophen (PERCOCET) 10-325 MG per tablet as needed.       . ranitidine (ZANTAC) 300 MG tablet Take 300 mg by mouth daily before breakfast.        . simvastatin (ZOCOR) 20 MG tablet Take 20 mg by mouth at bedtime.        . TRI-SPRINTEC 0.18/0.215/0.25 MG-35 MCG tablet       . zolpidem (AMBIEN) 5 MG tablet Take 5 mg by mouth at bedtime as needed.          Discontinued Meds:   There are no discontinued medications.  Patient Active Problem List  Diagnosis  . Hyperlipidemia  . Obesity  . Obstructive sleep apnea  . Hypertension  . Cerebrovascular disease    LABS No visits with results within 3 Month(s) from this visit. Latest known visit with results is:  Hospital Outpatient Visit on 10/08/2008  Component Date Value  . Preg Test, Ur 10/08/2008                     Value:NEGATIVE                                THE SENSITIVITY OF THIS                   METHODOLOGY IS >24 mIU/mL  . Sodium 10/08/2008 142   . Potassium 10/08/2008 4.0   . Chloride 10/08/2008 105   . BUN 10/08/2008 5*  . Creatinine, Ser 10/08/2008 0.8   . Glucose, Bld 10/08/2008 102*  . Calcium, Ion 10/08/2008 1.24   . TCO2 10/08/2008 26   . Hemoglobin 10/08/2008 13.9   . HCT 10/08/2008 41.0   . Glucose-Capillary 10/08/2008 83   . Comment 1 10/08/2008 Notify RN   . Comment 2 10/08/2008 Documented in Chart   . Hemoglobin 10/08/2008 11.8*  . HCT 10/08/2008 34.4*     Results for this Opt Visit:  Results for orders placed during the hospital encounter of 10/08/08  PREGNANCY, URINE      Component Value Range   Preg Test, Ur       Value: NEGATIVE            THE SENSITIVITY OF THIS     METHODOLOGY IS >24 mIU/mL  POCT I-STAT, CHEM 8      Component Value Range   Sodium 142  135 - 145 mEq/L   Potassium 4.0  3.5 - 5.1 mEq/L   Chloride 105  96 - 112 mEq/L   BUN 5 (*) 6 - 23 mg/dL   Creatinine, Ser 0.8  0.4 - 1.2 mg/dL   Glucose, Bld 161 (*) 70 - 99 mg/dL   Calcium, Ion 0.96  0.45 - 1.32 mmol/L   TCO2 26  0 - 100 mmol/L   Hemoglobin 13.9  12.0 - 15.0 g/dL   HCT 40.9  81.1 - 91.4 %  GLUCOSE, CAPILLARY      Component Value Range   Glucose-Capillary 83  70 - 99 mg/dL   Comment 1 Notify RN     Comment 2 Documented in Chart    HEMOGLOBIN AND HEMATOCRIT, BLOOD      Component Value Range   Hemoglobin 11.8 (*) 12.0 - 15.0 g/dL   HCT 78.2 (*) 95.6 - 21.3 %    EKG No orders found for this or any previous visit.   Prior Assessment and Plan Problem List as of 04/25/2012            Cardiology Problems   Hyperlipidemia   Hypertension   Cerebrovascular disease     Other   Obesity   Obstructive sleep apnea       Imaging: US Carotid Duplex Bilateral  04/20/2012  *RADIOLOGY REPORT*  Clinical Data: Asymptomatic left carotid bruit  BILATERAL CAROTID DUPLEX ULTRASOUND  Technique: Wallace Cullens scale imaging, color Doppler and duplex ultrasound was performed  of bilateral carotid and vertebral arteries in the neck.  Comparison:  06/14/2007  Criteria:  Quantification of carotid stenosis is based on velocity parameters that correlate the residual internal carotid diameter with NASCET-based stenosis levels, using the diameter of the distal internal carotid lumen as the denominator for stenosis measurement.  The following velocity measurements were obtained:                   PEAK SYSTOLIC/END DIASTOLIC RIGHT ICA:                        154/34cm/sec CCA:                        138/29cm/sec SYSTOLIC ICA/CCA RATIO:     1.11 DIASTOLIC ICA/CCA RATIO:    1.19 ECA:                        106cm/sec  LEFT ICA:                        135/32cm/sec CCA:                        144/23cm/sec SYSTOLIC ICA/CCA RATIO:     0.94 DIASTOLIC ICA/CCA RATIO:    1.40 ECA:                        134cm/sec  Findings:  RIGHT CAROTID ARTERY:  Very minor intimal thickening.  No hemodynamically significant ICA stenosis, velocity elevation, or turbulent flow.  RIGHT VERTEBRAL ARTERY:  Antegrade  LEFT CAROTID ARTERY: Very minor intimal thickening.  No significant plaque.  No hemodynamically significant left ICA stenosis, velocity elevation, or turbulent flow.  LEFT VERTEBRAL ARTERY:  Antegrade  IMPRESSION: No hemodynamically significant stenosis by ultrasound   Original Report Authenticated By: Judie Petit. Ruel Favors, M.D.      [facility] Calculation: Score not calculated. Missing: Total Cholesterol

## 2012-04-25 NOTE — Patient Instructions (Addendum)
Your physician recommends that you schedule a follow-up appointment in: 9 months  Your physician has recommended you make the following change in your medication:  1 - STOP Zocor 2 - INCREASE Lipitor to 80 mg daily  Your physician recommends that you return for lab work in: 1 month

## 2012-04-26 ENCOUNTER — Encounter: Payer: Self-pay | Admitting: *Deleted

## 2012-04-26 ENCOUNTER — Encounter: Payer: Self-pay | Admitting: Cardiology

## 2012-05-05 ENCOUNTER — Encounter: Payer: Self-pay | Admitting: Cardiology

## 2012-05-12 ENCOUNTER — Encounter: Payer: Self-pay | Admitting: Cardiology

## 2012-05-12 DIAGNOSIS — R7301 Impaired fasting glucose: Secondary | ICD-10-CM | POA: Insufficient documentation

## 2012-05-12 DIAGNOSIS — R011 Cardiac murmur, unspecified: Secondary | ICD-10-CM | POA: Insufficient documentation

## 2012-05-12 HISTORY — DX: Impaired fasting glucose: R73.01

## 2012-05-12 NOTE — Assessment & Plan Note (Signed)
Blood pressure control is excellent; current medications will be continued. 

## 2012-05-12 NOTE — Assessment & Plan Note (Signed)
Mild fasting hyperglycemia.  Hemoglobin A1c-5.9 in 2008; I doubt she has developed frank diabetes in the interim.  Importance of weight loss emphasized.

## 2012-05-12 NOTE — Assessment & Plan Note (Signed)
Hyperlipidemia remains uncontrolled despite substantial doses of statins.  Simvastatin will be discontinued and atorvastatin dosage increased to a maximum of 80 mg per day.  Repeat lipid profile will be obtained in one month.

## 2012-05-12 NOTE — Assessment & Plan Note (Signed)
Etiology of left neck bruit is unclear; however, significant cerebrovascular disease has been excluded.  No further workup indicated at present.

## 2012-05-12 NOTE — Assessment & Plan Note (Signed)
Patient does have a benign sounding systolic murmur, which is apparently functional based upon prior negative evaluation including a transesophageal echocardiogram.  A bruit is also noted over the left neck, which probably does not represent transmission of this benign murmur.

## 2012-05-27 ENCOUNTER — Other Ambulatory Visit: Payer: Self-pay | Admitting: *Deleted

## 2012-05-27 DIAGNOSIS — E782 Mixed hyperlipidemia: Secondary | ICD-10-CM

## 2012-06-10 ENCOUNTER — Encounter: Payer: Self-pay | Admitting: *Deleted

## 2012-10-07 ENCOUNTER — Emergency Department (HOSPITAL_COMMUNITY): Payer: Medicare Other

## 2012-10-07 ENCOUNTER — Encounter (HOSPITAL_COMMUNITY): Payer: Self-pay

## 2012-10-07 ENCOUNTER — Emergency Department (HOSPITAL_COMMUNITY)
Admission: EM | Admit: 2012-10-07 | Discharge: 2012-10-07 | Disposition: A | Discharge status: Home / Self Care | Payer: Medicare Other | Attending: Emergency Medicine | Admitting: Emergency Medicine

## 2012-10-07 DIAGNOSIS — Z79899 Other long term (current) drug therapy: Secondary | ICD-10-CM | POA: Insufficient documentation

## 2012-10-07 DIAGNOSIS — Z8719 Personal history of other diseases of the digestive system: Secondary | ICD-10-CM | POA: Insufficient documentation

## 2012-10-07 DIAGNOSIS — Z8673 Personal history of transient ischemic attack (TIA), and cerebral infarction without residual deficits: Secondary | ICD-10-CM | POA: Insufficient documentation

## 2012-10-07 DIAGNOSIS — S4980XA Other specified injuries of shoulder and upper arm, unspecified arm, initial encounter: Secondary | ICD-10-CM | POA: Insufficient documentation

## 2012-10-07 DIAGNOSIS — Y9389 Activity, other specified: Secondary | ICD-10-CM | POA: Insufficient documentation

## 2012-10-07 DIAGNOSIS — G4733 Obstructive sleep apnea (adult) (pediatric): Secondary | ICD-10-CM | POA: Insufficient documentation

## 2012-10-07 DIAGNOSIS — S46909A Unspecified injury of unspecified muscle, fascia and tendon at shoulder and upper arm level, unspecified arm, initial encounter: Secondary | ICD-10-CM | POA: Insufficient documentation

## 2012-10-07 DIAGNOSIS — I10 Essential (primary) hypertension: Secondary | ICD-10-CM | POA: Insufficient documentation

## 2012-10-07 DIAGNOSIS — S0990XA Unspecified injury of head, initial encounter: Secondary | ICD-10-CM | POA: Insufficient documentation

## 2012-10-07 DIAGNOSIS — Z862 Personal history of diseases of the blood and blood-forming organs and certain disorders involving the immune mechanism: Secondary | ICD-10-CM | POA: Insufficient documentation

## 2012-10-07 DIAGNOSIS — IMO0002 Reserved for concepts with insufficient information to code with codable children: Secondary | ICD-10-CM | POA: Insufficient documentation

## 2012-10-07 DIAGNOSIS — Y92410 Unspecified street and highway as the place of occurrence of the external cause: Secondary | ICD-10-CM | POA: Insufficient documentation

## 2012-10-07 DIAGNOSIS — E669 Obesity, unspecified: Secondary | ICD-10-CM | POA: Insufficient documentation

## 2012-10-07 DIAGNOSIS — T148XXA Other injury of unspecified body region, initial encounter: Secondary | ICD-10-CM

## 2012-10-07 DIAGNOSIS — G8929 Other chronic pain: Secondary | ICD-10-CM | POA: Insufficient documentation

## 2012-10-07 DIAGNOSIS — S139XXA Sprain of joints and ligaments of unspecified parts of neck, initial encounter: Secondary | ICD-10-CM | POA: Insufficient documentation

## 2012-10-07 DIAGNOSIS — Z8639 Personal history of other endocrine, nutritional and metabolic disease: Secondary | ICD-10-CM | POA: Insufficient documentation

## 2012-10-07 DIAGNOSIS — E785 Hyperlipidemia, unspecified: Secondary | ICD-10-CM | POA: Insufficient documentation

## 2012-10-07 MED ORDER — HYDROCODONE-ACETAMINOPHEN 5-325 MG PO TABS
1.0000 | ORAL_TABLET | Freq: Once | ORAL | Status: AC
  Administered 2012-10-07: 1 via ORAL
  Filled 2012-10-07: qty 1

## 2012-10-07 MED ORDER — HYDROCODONE-ACETAMINOPHEN 5-325 MG PO TABS: 1.0000 | ORAL_TABLET | ORAL | Status: DC | PRN

## 2012-10-07 MED ORDER — METAXALONE 800 MG PO TABS: 800.0000 mg | ORAL_TABLET | Freq: Three times a day (TID) | ORAL | Status: DC | PRN
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
## 2012-10-07 NOTE — ED Notes (Signed)
Pt to xray dept.
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
## 2012-10-07 NOTE — ED Notes (Addendum)
Pt states pain to head, neck, and entire back. NAD. Pt arrived LSB with C-collar in place. LSB removed upon arrival by PA during assessment. C-Collar remains in place. Denies LOC.

## 2012-10-09 NOTE — ED Provider Notes (Signed)
Medical screening examination/treatment/procedure(s) were performed by non-physician practitioner and as supervising physician I was immediately available for consultation/collaboration.    Faheem Ziemann D Luceil Herrin, MD 10/09/12 0328 

## 2012-10-20 ENCOUNTER — Encounter: Payer: Self-pay | Admitting: *Deleted

## 2012-10-20 ENCOUNTER — Encounter: Payer: Self-pay | Admitting: Gastroenterology

## 2012-10-25 ENCOUNTER — Ambulatory Visit (INDEPENDENT_AMBULATORY_CARE_PROVIDER_SITE_OTHER): Payer: Medicare Other | Admitting: Gastroenterology

## 2012-10-25 ENCOUNTER — Encounter: Payer: Self-pay | Admitting: Gastroenterology

## 2012-10-25 VITALS — BP 110/64 | HR 88 | Ht 61.0 in | Wt 243.0 lb

## 2012-10-25 DIAGNOSIS — R1314 Dysphagia, pharyngoesophageal phase: Secondary | ICD-10-CM

## 2012-10-25 DIAGNOSIS — K219 Gastro-esophageal reflux disease without esophagitis: Secondary | ICD-10-CM

## 2012-10-25 NOTE — Addendum Note (Signed)
Addended by: Ok Anis A on: 10/25/2012 02:35 PM   Modules accepted: Orders
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
## 2012-10-25 NOTE — Patient Instructions (Addendum)
  You have been scheduled for an endoscopy with propofol. Please follow written instructions given to you at your visit today. If you use inhalers (even only as needed), please bring them with you on the day of your procedure.  Please cancel by 5 pm today.  You watched a movie today on acid reflux. ____________________________________________________________________________                                               We are excited to introduce MyChart, a new best-in-class service that provides you online access to important information in your electronic medical record. We want to make it easier for you to view your health information - all in one secure location - when and where you need it. We expect MyChart will enhance the quality of care and service we provide.  When you register for MyChart, you can:    View your test results.    Request appointments and receive appointment reminders via email.    Request medication renewals.    View your medical history, allergies, medications and immunizations.    Communicate with your physician's office through a password-protected site.    Conveniently print information such as your medication lists.  To find out if MyChart is right for you, please talk to a member of our clinical staff today. We will gladly answer your questions about this free health and wellness tool.  If you are age 33 or older and want a member of your family to have access to your record, you must provide written consent by completing a proxy form available at our office. Please speak to our clinical staff about guidelines regarding accounts for patients younger than age 76.  As you activate your MyChart account and need any technical assistance, please call the MyChart technical support line at [phone]-[phone]) or email your question to [email or web address]. If you email your question(s), please include your name, a return phone number and the best  time to reach you.  If you have non-urgent health-related questions, you can send a message to our office through MyChart at [email or web address]. If you have a medical emergency, call 911.  Thank you for using MyChart as your new health and wellness resource!   MyChart licensed from [employer],  9811-9147. Patents Pending.

## 2012-10-26 ENCOUNTER — Ambulatory Visit (AMBULATORY_SURGERY_CENTER): Payer: Medicare Other | Admitting: Gastroenterology

## 2012-10-26 ENCOUNTER — Encounter: Payer: Self-pay | Admitting: Gastroenterology

## 2012-10-26 ENCOUNTER — Other Ambulatory Visit: Payer: Self-pay | Admitting: *Deleted

## 2012-10-26 ENCOUNTER — Telehealth: Payer: Self-pay | Admitting: *Deleted

## 2012-10-26 VITALS — BP 122/58 | HR 79 | Temp 99.4°F | Resp 20 | Ht 61.0 in | Wt 243.0 lb

## 2012-10-26 DIAGNOSIS — K219 Gastro-esophageal reflux disease without esophagitis: Secondary | ICD-10-CM

## 2012-10-26 DIAGNOSIS — R1314 Dysphagia, pharyngoesophageal phase: Secondary | ICD-10-CM

## 2012-10-26 DIAGNOSIS — K2210 Ulcer of esophagus without bleeding: Secondary | ICD-10-CM

## 2012-10-26 DIAGNOSIS — K210 Gastro-esophageal reflux disease with esophagitis: Secondary | ICD-10-CM

## 2012-10-26 DIAGNOSIS — R1310 Dysphagia, unspecified: Secondary | ICD-10-CM

## 2012-10-26 MED ORDER — SODIUM CHLORIDE 0.9 % IV SOLN: 500.0000 mL | INTRAVENOUS | Status: DC

## 2012-10-26 MED ORDER — DEXLANSOPRAZOLE 60 MG PO CPDR: 60.0000 mg | DELAYED_RELEASE_CAPSULE | Freq: Every day | ORAL | Status: DC

## 2012-10-26 NOTE — Op Note (Signed)
[facility] [address] [place], [address]   ENDOSCOPY PROCEDURE REPORT  PATIENT: Sara, [name]  MR#: [id number] BIRTHDATE: Dec 20, 1980 , 31  yrs. old GENDER: Female ENDOSCOPIST:Jaquelin Meaney Hale Bogus, MD, [facility] REFERRED BY: PROCEDURE DATE:  10/26/2012 PROCEDURE:   EGD w/ biopsy ASA CLASS:    Class I INDICATIONS: Follow up of esophageal reflux. ..refractory GERD MEDICATION: propofol (Diprivan) 150mg  IV TOPICAL ANESTHETIC:   Cetacaine Spray  DESCRIPTION OF PROCEDURE:   After the risks and benefits of the procedure were explained, informed consent was obtained.  The LB GIF-H180 [id number]  endoscope was introduced through the mouth  and advanced to the second portion of the duodenum .  The instrument was slowly withdrawn as the mucosa was fully examined.      DUODENUM: The duodenal mucosa showed no abnormalities in the bulb and second portion of the duodenum.  STOMACH: The mucosa of the stomach appeared normal.  ESOPHAGUS: Erosion was found in the lower third of the esophagus.Biopsies done...free reflux noted...    Retroflexed views revealed a 3cm. hiatial hernia.     The scope was then withdrawn from the patient and the procedure completed.  COMPLICATIONS: There were no complications.   ENDOSCOPIC IMPRESSION: 1.   The duodenal mucosa showed no abnormalities in the bulb and second portion of the duodenum 2.   The mucosa of the stomach appeared normal 3.   Erosion was found in the in the lower third of the esophagus c/w GERD,r/o Barrett's mucosa 4.  Manometry to be scheduled...possible fundoplication needed... RECOMMENDATIONS: Await pathology results Dexilant 60md/day   _______________________________ eSigned:  Mardella Layman, MD, [facility] 10/26/2012 4:02 PM  cc Dr. Susy Frizzle [name],[facility] standard discharge

## 2012-10-26 NOTE — Progress Notes (Signed)
Report to pacu rn, vss, bbs=clear 

## 2012-10-26 NOTE — Patient Instructions (Addendum)
Discharge instructions given with verbal understanding. Biopsies taken. Resume previous medications. YOU HAD AN ENDOSCOPIC PROCEDURE TODAY AT THE [facility]: Refer to the procedure report that was given to you for any specific questions about what was found during the examination.  If the procedure report does not answer your questions, please call your gastroenterologist to clarify.  If you requested that your care partner not be given the details of your procedure findings, then the procedure report has been included in a sealed envelope for you to review at your convenience later.  YOU SHOULD EXPECT: Some feelings of bloating in the abdomen. Passage of more gas than usual.  Walking can help get rid of the air that was put into your GI tract during the procedure and reduce the bloating. If you had a lower endoscopy (such as a colonoscopy or flexible sigmoidoscopy) you may notice spotting of blood in your stool or on the toilet paper. If you underwent a bowel prep for your procedure, then you may not have a normal bowel movement for a few days.  DIET: Your first meal following the procedure should be a light meal and then it is ok to progress to your normal diet.  A half-sandwich or bowl of soup is an example of a good first meal.  Heavy or fried foods are harder to digest and may make you feel nauseous or bloated.  Likewise meals heavy in dairy and vegetables can cause extra gas to form and this can also increase the bloating.  Drink plenty of fluids but you should avoid alcoholic beverages for 24 hours.  ACTIVITY: Your care partner should take you home directly after the procedure.  You should plan to take it easy, moving slowly for the rest of the day.  You can resume normal activity the day after the procedure however you should NOT DRIVE or use heavy machinery for 24 hours (because of the sedation medicines used during the test).    SYMPTOMS TO REPORT IMMEDIATELY: A gastroenterologist  can be reached at any hour.  During normal business hours, 8:30 AM to 5:00 PM Monday through Friday, call [phone].  After hours and on weekends, please call the GI answering service at [phone] who will take a message and have the physician on call contact you.   Following upper endoscopy (EGD)  Vomiting of blood or coffee ground material  New chest pain or pain under the shoulder blades  Painful or persistently difficult swallowing  New shortness of breath  Fever of 100F or higher  Black, tarry-looking stools  FOLLOW UP: If any biopsies were taken you will be contacted by phone or by letter within the next 1-3 weeks.  Call your gastroenterologist if you have not heard about the biopsies in 3 weeks.  Our staff will call the home number listed on your records the next business day following your procedure to check on you and address any questions or concerns that you may have at that time regarding the information given to you following your procedure. This is a courtesy call and so if there is no answer at the home number and we have not heard from you through the emergency physician on call, we will assume that you have returned to your regular daily activities without incident.  SIGNATURES/CONFIDENTIALITY: You and/or your care partner have signed paperwork which will be entered into your electronic medical record.  These signatures attest to the fact that that the information above on your After   Visit Summary has been reviewed and is understood.  Full responsibility of the confidentiality of this discharge information lies with you and/or your care-partner. 

## 2012-10-26 NOTE — Progress Notes (Signed)
Patient did not experience any of the following events: a burn prior to discharge; a fall within the facility; wrong site/side/patient/procedure/implant event; or a hospital transfer or hospital admission upon discharge from the facility. (G8907) Patient did not have preoperative order for IV antibiotic SSI prophylaxis. (G8918)  

## 2012-10-26 NOTE — Progress Notes (Signed)
Called to room to assist during endoscopic procedure.  Patient ID and intended procedure confirmed with present staff. Received instructions for my participation in the procedure from the performing physician.  

## 2012-10-26 NOTE — Telephone Encounter (Signed)
PER DR PATTERSON PATIENT NEEDS REFERRAL TO [facility].  Assessment and plan: Severe GERD young patient with mild obesity and a BMI of 45. Other considerations would be an esophageal motility disorder associated perhaps with Raynaud's phenomenon. We will initiate a workup with endoscopic exam, but she will need esophageal manometry and perhaps surgical referral for fundoplication. Have not prescribed additional medications pending endoscopic exam which would be done ASAP. I have reviewed an antireflux regime with her, she saw patient education video on GERD.   SENT MESSAGE TO BLANCA AT [facility] AND TOLD PATIENT THAT SHE WILL BE NOTIFIED BY [facility] REGARDING HER APPOINTMENT WITH [facility].   PATIENT VERBALIZED UNDERSTANDING.     PATIENT WAS SCHEDULED FOR ESOPHAGEAL MANOMETRY AND INSTRUCTIONS WERE GIVEN TO THE PATIENT IN [facility] BOOKING NUMBER IS [id number]. (PATIENT AND FAMILY VERBALIZED UNDERSTANDING OF ESOPHAGEAL MANOMETRY INSTRUCTIONS)

## 2012-10-27 ENCOUNTER — Telehealth: Payer: Self-pay | Admitting: *Deleted

## 2012-10-27 NOTE — Telephone Encounter (Signed)
  Follow up Call-  Call back number 10/26/2012  Post procedure Call Back phone  # [phone]  Permission to leave phone message Yes     Patient questions:  Do you have a fever, pain , or abdominal swelling? no Pain Score  0 *  Have you tolerated food without any problems? yes  Have you been able to return to your normal activities? yes  Do you have any questions about your discharge instructions: Diet   no Medications  no Follow up visit  no  Do you have questions or concerns about your Care? no  Actions: * If pain score is 4 or above: No action needed, pain <4.

## 2012-10-27 NOTE — Telephone Encounter (Signed)
Gave patient [facility] appointment information.  Patient verbalized understanding.

## 2012-10-28 ENCOUNTER — Telehealth: Payer: Self-pay | Admitting: Gastroenterology

## 2012-10-28 ENCOUNTER — Ambulatory Visit: Payer: Medicare Other | Admitting: Nurse Practitioner

## 2012-10-28 ENCOUNTER — Telehealth: Payer: Self-pay | Admitting: *Deleted

## 2012-10-28 ENCOUNTER — Encounter: Payer: Self-pay | Admitting: Nurse Practitioner

## 2012-10-28 ENCOUNTER — Ambulatory Visit (INDEPENDENT_AMBULATORY_CARE_PROVIDER_SITE_OTHER): Payer: Medicare Other | Admitting: Nurse Practitioner

## 2012-10-28 ENCOUNTER — Other Ambulatory Visit: Payer: Self-pay | Admitting: Gastroenterology

## 2012-10-28 ENCOUNTER — Encounter: Payer: Self-pay | Admitting: Gastroenterology

## 2012-10-28 VITALS — BP 128/82 | HR 85 | Ht 61.0 in | Wt 246.0 lb

## 2012-10-28 DIAGNOSIS — K648 Other hemorrhoids: Secondary | ICD-10-CM

## 2012-10-28 DIAGNOSIS — K602 Anal fissure, unspecified: Secondary | ICD-10-CM

## 2012-10-28 DIAGNOSIS — K625 Hemorrhage of anus and rectum: Secondary | ICD-10-CM

## 2012-10-28 MED ORDER — HYDROCORTISONE ACETATE 25 MG RE SUPP: RECTAL | Status: DC

## 2012-10-28 NOTE — Patient Instructions (Addendum)
We have sent a prescription for steroid suppositories to your pharmacy, [facility]. Please use 1 nightly for 10 days.  Please call our office in approximately 10 days to let us you are doing. If symptoms worsen (recurrent bleeding, he developed rectal pain ), call back right away.  Otherwise, you can followup with Dr. Jarold Motto as needed.

## 2012-10-28 NOTE — Telephone Encounter (Signed)
Pt had EGD with bx on 10/26/12 and today she reports BRB with her stool; blood on TP as well as in the bowl. Pt denies pain or cramping. Since it's Friday, had her come in to see Willette Cluster, NP in Dr Norval Gable absence.

## 2012-10-28 NOTE — Telephone Encounter (Signed)
Pt given an appt today with Willette Cluster, NP.

## 2012-10-28 NOTE — Progress Notes (Signed)
10/28/2012 Sara [name] [id number] 03-16-1981   History of Present Illness:  Patient is a 32 year old female recently seen by Dr. Jarold Motto for evaluation of GERD symptoms and dysphasia. Patient underwent upper endoscopy 10/27/11. Findings included an erosion in the lower third of the esophagus compatible with GERD. Exam was otherwise normal. Esophageal biopsies still pending. Patient was scheduled for a manometry study. She is worked in today for evaluation of painless rectal bleeding. Patient had a loose stool with bright red blood today at noon. Her stools are chronically loose. No abdominal pain or rectal pain. Feels okay.   Current Medications, Allergies, Past Medical History, Past Surgical History, Family History and Social History were reviewed in [employer] record.   Physical Exam: General: pleasant, obese, white female in no acute distress Head: Normocephalic and atraumatic Eyes:  sclerae anicteric, conjunctiva pink  Lungs: Clear throughout to auscultation Heart: Regular rate and rhythm Abdomen: Soft, non tender and non distended. No masses, no hepatomegaly. Normal bowel sounds Rectal: Mildly inflamed internal hemorrhoids on exam. On anoscopy there was a very small, shallow fissure on posterior wall of the anal canal Neurological: Alert oriented x 4, grossly nonfocal Psychological:  Alert and cooperative. Normal mood and affect  Assessment and Recommendations:   Isolated episode of painless rectal bleeding today. Her stools are always loose but have never been associated with bleeding. Tiny, superficial fissure on posterior wall of anal canal seen on anoscopy. Also, mildly inflammed internal hemorrhoids. Not sure if bleeding was from fissure, hemorrhoids, or both. She denies anorectal rectal pain but was somewhat tender on digital rectal exam. Will treat hemorrhoids wih steroid suppositories for 10 days. Since she is not having any pain with defecation and the  fissure is very small /superficial, will hold off on diltiazem or nitroglycerin gel for now. Patient will call us with condition update after completion of steroid suppositories. She will call us before then however if she has recurrent bleeding or develops anorectal pain.

## 2012-10-31 ENCOUNTER — Telehealth: Payer: Self-pay | Admitting: *Deleted

## 2012-10-31 ENCOUNTER — Encounter: Payer: Self-pay | Admitting: Gastroenterology

## 2012-10-31 ENCOUNTER — Other Ambulatory Visit: Payer: Self-pay | Admitting: Family Medicine

## 2012-10-31 ENCOUNTER — Encounter: Payer: Self-pay | Admitting: Family Medicine

## 2012-10-31 DIAGNOSIS — F419 Anxiety disorder, unspecified: Secondary | ICD-10-CM

## 2012-10-31 MED ORDER — DEXLANSOPRAZOLE 60 MG PO CPDR: 60.0000 mg | DELAYED_RELEASE_CAPSULE | Freq: Every day | ORAL | Status: DC

## 2012-10-31 NOTE — Telephone Encounter (Signed)
rx sent

## 2012-10-31 NOTE — Telephone Encounter (Signed)
Need approval for controlled medication. 

## 2012-11-01 ENCOUNTER — Encounter: Payer: Self-pay | Admitting: Gastroenterology

## 2012-11-01 ENCOUNTER — Other Ambulatory Visit: Payer: Self-pay | Admitting: Family Medicine

## 2012-11-01 ENCOUNTER — Telehealth: Payer: Self-pay | Admitting: *Deleted

## 2012-11-01 ENCOUNTER — Encounter: Payer: Self-pay | Admitting: *Deleted

## 2012-11-01 ENCOUNTER — Ambulatory Visit: Payer: Medicare Other | Admitting: Gastroenterology

## 2012-11-01 NOTE — Telephone Encounter (Signed)
Received fax from [facility] for prior authorization for patients Dexilant.  Filled out and faxed back.

## 2012-11-01 NOTE — Telephone Encounter (Signed)
Medication refilled per protocol. 

## 2012-11-01 NOTE — Telephone Encounter (Signed)
Ok to call out 30. 

## 2012-11-02 ENCOUNTER — Telehealth: Payer: Self-pay | Admitting: *Deleted

## 2012-11-02 NOTE — Telephone Encounter (Signed)
-----   Message from Valerie Roys to Mardella Layman, MD sent at 11/01/2012 11:31 AM -----    I saw Sara [name] on Friday. She and I talked about my bowel   Movements. She stated I may need to see if Dr Jarold Motto can    Prescribe something that it sounds like irritable bowel   Symptoms. My stools are very watery like and have been   For a very long time and when I eat anything it goes right    Through me is there anything you can reccomend? Thank you  Pt reports she's had loose watery stools for 4-5 months; has a stool after she eats. When asked, she states the stools smell worse than she remembers. She denies antibiotic use in the past 6 months. She didn't know we tx this until she saw Willette Cluster for rectal bleeding last week. She is not on any probiotics. She will see Dr Jarold Motto tomorrow.

## 2012-11-03 ENCOUNTER — Ambulatory Visit (INDEPENDENT_AMBULATORY_CARE_PROVIDER_SITE_OTHER): Payer: Medicare Other | Admitting: Gastroenterology

## 2012-11-03 ENCOUNTER — Encounter: Payer: Self-pay | Admitting: Gastroenterology

## 2012-11-03 VITALS — BP 120/66 | HR 86 | Ht 61.0 in | Wt 246.0 lb

## 2012-11-03 DIAGNOSIS — K625 Hemorrhage of anus and rectum: Secondary | ICD-10-CM

## 2012-11-03 DIAGNOSIS — K219 Gastro-esophageal reflux disease without esophagitis: Secondary | ICD-10-CM

## 2012-11-03 DIAGNOSIS — K6289 Other specified diseases of anus and rectum: Secondary | ICD-10-CM

## 2012-11-03 MED ORDER — MOVIPREP 100 G PO SOLR: 1.0000 | Freq: Once | ORAL | Status: DC

## 2012-11-03 MED ORDER — CILIDINIUM-CHLORDIAZEPOXIDE 2.5-5 MG PO CAPS: 1.0000 | ORAL_CAPSULE | Freq: Three times a day (TID) | ORAL | Status: DC | PRN

## 2012-11-03 MED ORDER — MESALAMINE 1000 MG RE SUPP: RECTAL | Status: DC

## 2012-11-03 NOTE — Progress Notes (Signed)
This is a 32 year old Caucasian female with GERD being evaluated for possible fundoplication.  Recent endoscopy confirmed erosive esophagitis.  Biopsy showed no evidence of Barrett's mucosa.  She has esophageal manometry scheduled in several days.  She now presents with recurrent diarrhea, some crampy lower abdominal pain, and periodic rectal bleeding.  Attempts to treat her for hemorrhoid disease with Anusol-HC suppositories has been unsuccessful.  She relates she's had bowel irregularity and intermittent rectal bleeding for several years, but has not had previous barium enema or colonoscopy.  She denies systemic complaints, arthritis, mouth sores, or other systemic complaints.  She is on multiple medications listed and reviewed her record.  Family history is noncontributory.   Current Medications, Allergies, Past Medical History, Past Surgical History, Family History and Social History were reviewed in [employer] record.  ROS: All systems were reviewed and are negative unless otherwise stated in the HPI.          Physical Exam: Healthy-appearing patient who is in no distress.  Blood pressure 120/66, pulse 86 and regular, and weight 246 with a BMI of 46.51.  I cannot appreciate stigmata of chronic liver disease.  Abdomen shows no organomegaly, masses or tenderness.  Inspection the rectum is unremarkable as is rectal exam.  There is some stool present which is guaiac positive and slight amount of heme.  Mental status is normal.    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  She is  To complete her manometry, and we will do colonoscopy in one week's time.  I placed her on Canasa 1 g suppositories at bedtime along with a low fiber diet for now. Encounter Diagnosis  Name Primary?  . Rectal bleeding Yes

## 2012-11-03 NOTE — Patient Instructions (Addendum)
You have been scheduled for a colonoscopy with propofol. Please follow written instructions given to you at your visit today.  Please pick up your prep kit at the pharmacy within the next 1-3 days. If you use inhalers (even only as needed), please bring them with you on the day of your procedure.  Please stop Anusol Suppositories and start Canasa Suppositories one suppository at bedtime for 10 days.  Librax was sent to your pharmacy, please take as directed.  Please follow Low Fiber Diet below. ____________________________________________________________________________________________________________________  Low-Fiber Diet Fiber is found in fruits, vegetables, and grains. A low-fiber diet restricts fibrous foods that are not digested in the small intestine. A diet containing about 10 grams of fiber is considered low fiber.  PURPOSE  To prevent blockage of a partially obstructed or narrowed gastrointestinal tract.  To reduce fecal weight and volume.  To slow the movement of feces. WHEN IS THIS DIET USED?  It may be used during the acute phase of Crohn disease, ulcerative colitis, regional enteritis, or diverticulitis.  It may be used if your intestinal or esophageal tubes are narrowing (stenosis).  It may be used as a transitional diet following surgery, injury (trauma), or illness. CHOOSING FOODS Check labels, especially on foods from the starch list. Often times, dietary fiber content is listed on the nutrition facts panel. Please ask your Registered Dietitian if you have questions about specific foods that are related to your condition, especially if the food is not listed on this handout. Breads and Starches  Allowed: White, [place], and pita breads, plain rolls, buns, or sweet rolls, doughnuts, waffles, pancakes, bagels. Plain muffins, biscuits, matzoth. Soda, saltine, graham crackers. Pretzels, rusks, melba toast, zwieback. Cooked cereals: cornmeal, farina, or cream cereals. Dry  cereals: refined corn, wheat, rice, and oat cereals (check label). Potatoes prepared any way without skins, refined macaroni, spaghetti, noodles, refined rice.  Avoid: Whole-wheat bread, rolls, and crackers. Multigrains, rye, bran seeds, nuts, or coconut. Cereals containing whole grains, multigrains, bran, coconut, nuts, raisins. Cooked or dry oatmeal. Coarse wheat cereals, granola. Cereals advertised as "high fiber." Potato skins. Whole-grain pasta, wild or brown rice. Popcorn. Vegetables  Allowed: Strained tomato and vegetable juices. Fresh lettuce, cucumber, spinach. Well-cooked or canned: asparagus, bean sprouts, broccoli, cut green beans, cauliflower, pumpkin, beets, mushrooms, olives, yellow squash, tomato, tomato sauce, zucchini, turnips.Keep servings limited to  cup.  Avoid: Fresh, cooked, or canned: artichokes, baked beans, beet greens, Brussels sprouts, corn, kale, legumes, peas, sweet potatoes. Avoid large servings of any vegetables. Fruit  Allowed: All fruit juices except prune juice. Cooked or canned fruits without skin and seeds: apricots, applesauce, cantaloupe, cherries, grapefruit, grapes, kiwi, mandarin oranges, peaches, pears, fruit cocktail, pineapple, plums, watermelon. Fresh without skin: banana, grapes, cantaloupe, avocado, cherries, pineapple, kiwi, nectarines, peaches, blueberries. Keep servings limited to  cup or 1 piece.  Avoid: Fresh: apples with or without skin, apricots, mangoes, pears, raspberries, strawberries. Prune juice and juices with pulp, stewed or dried prunes. Dried fruits, raisins, dates. Avoid large servings of all fresh fruits. Meat and Protein Substitutes  Allowed: Ground or well-cooked tender beef, ham, veal, lamb, pork, poultry. Eggs, plain cheese. Fish, oysters, shrimp, lobster, other seafood. Liver, organ meats. Smooth nut butters.  Avoid: Tough, fibrous meats with gristle. Chunky nut butter.Cheese with seeds, nuts, or other foods not allowed.  Nuts, seeds, legumes, dried peas, beans, lentils. Dairy  Allowed: All milk products except those not allowed.  Avoid: Yogurt or cheese that contains nuts, seeds, or added fruit. Soups and  Combination Foods  Allowed: Bouillon, broth, or cream soups made from allowed foods. Any strained soup. Casseroles or mixed dishes made with allowed foods.  Avoid: Soups made from vegetables that are not allowed or that contain other foods not allowed. Desserts and Sweets  Allowed:Plain cakes and cookies, pie made with allowed fruit, pudding, custard, cream pie. Gelatin, fruit, ice, sherbet, frozen ice pops. Ice cream, ice milk without nuts. Plain hard candy, honey, jelly, molasses, syrup, sugar, chocolate syrup, gumdrops, marshmallows.  Avoid: Desserts, cookies, or candies that contain nuts, peanut butter, dried fruits. Jams, preserves with seeds, marmalade. Fats and Oils  Allowed:Margarine, butter, cream, mayonnaise, salad oils, plain salad dressings made from allowed foods.  Avoid: Seeds, nuts, olives. Beverages  Allowed: All, except those listed to avoid.  Avoid: Fruit juices with high pulp, prune juice. Condiments  Allowed:Ketchup, mustard, horseradish, vinegar, cream sauce, cheese sauce, cocoa powder. Spices in moderation: allspice, basil, bay leaves, celery powder or leaves, cinnamon, cumin powder, curry powder, ginger, mace, marjoram, onion or garlic powder, oregano, paprika, parsley flakes, ground pepper, rosemary, sage, savory, tarragon, thyme, turmeric.  Avoid: Coconut, pickles. SAMPLE MENU Breakfast   cup orange juice.  1 boiled egg.  1 slice white toast.  Margarine.   cup cornflakes.  1 cup milk.  Beverage. Lunch   cup chicken noodle soup.  2 to 3 oz sliced roast beef.  2 slices white bread.  Mayonnaise.   cup tomato juice.  1 small banana.  Beverage. Dinner  3 oz baked chicken.   cup scalloped potatoes.   cup cooked beets.  White dinner  roll.  Margarine.   cup canned peaches.  Beverage. Document Released: 01/16/2002 Document Revised: 10/19/2011 Document Reviewed: 08/13/2011 [facility] Patient Information 2013 [place], [place].

## 2012-11-07 ENCOUNTER — Ambulatory Visit (HOSPITAL_COMMUNITY)
Admission: RE | Admit: 2012-11-07 | Discharge: 2012-11-07 | Disposition: A | Discharge status: Home / Self Care | Payer: Medicare Other | Source: Ambulatory Visit | Attending: Gastroenterology | Admitting: Gastroenterology

## 2012-11-07 ENCOUNTER — Encounter (HOSPITAL_COMMUNITY)
Admission: RE | Disposition: A | Discharge status: Home / Self Care | Payer: Self-pay | Source: Ambulatory Visit | Attending: Gastroenterology

## 2012-11-07 ENCOUNTER — Encounter (HOSPITAL_COMMUNITY): Payer: Self-pay

## 2012-11-07 DIAGNOSIS — K219 Gastro-esophageal reflux disease without esophagitis: Secondary | ICD-10-CM | POA: Insufficient documentation

## 2012-11-07 DIAGNOSIS — K449 Diaphragmatic hernia without obstruction or gangrene: Secondary | ICD-10-CM | POA: Insufficient documentation

## 2012-11-07 HISTORY — PX: ESOPHAGEAL MANOMETRY: SHX5429

## 2012-11-07 SURGERY — MANOMETRY, ESOPHAGUS

## 2012-11-07 MED ORDER — LIDOCAINE VISCOUS 2 % MT SOLN
OROMUCOSAL | Status: AC
  Filled 2012-11-07: qty 15

## 2012-11-07 NOTE — Telephone Encounter (Signed)
See previous encounter

## 2012-11-08 ENCOUNTER — Encounter (HOSPITAL_COMMUNITY): Payer: Self-pay | Admitting: Gastroenterology

## 2012-11-08 ENCOUNTER — Telehealth: Payer: Self-pay | Admitting: Gastroenterology

## 2012-11-08 ENCOUNTER — Telehealth (INDEPENDENT_AMBULATORY_CARE_PROVIDER_SITE_OTHER): Payer: Self-pay

## 2012-11-08 NOTE — Telephone Encounter (Signed)
LMOM for pt letting her know that I have her scheduled to see MM on Thurs May 8 @ 920a.

## 2012-11-08 NOTE — Telephone Encounter (Signed)
Informed pt of path report from her EGD and that she should be receiving her letter soon. Pt reports she had her EM yesterday and sees Dr Daphine Deutscher in May. Since she has been on Librax, her stools are normal and she would like to cancel the COLON which I did. Pt knows to call for further problems.

## 2012-11-09 ENCOUNTER — Telehealth: Payer: Self-pay | Admitting: *Deleted

## 2012-11-09 ENCOUNTER — Encounter: Payer: Self-pay | Admitting: Gastroenterology

## 2012-11-09 NOTE — Telephone Encounter (Signed)
[facility] has informed me that my insurance [employer]   And Medicare will not cover the Dexilant or the Tempie Hoist without   Prior approval. They gave me a number for you to contact to expedite   The approval. Phone: [phone], fax: [phone]   I have a few Dexilant left from the samples. Thank you     Spoke with pt this am who stated she had tried Pepcid, Zantac, Prilosec and " everything over the counter ".  Dr Jarold Motto wanted Dexilant d/t the sever erosion on EGD Called and [employer] will fax me a form. Never received the form so I called the above number back and found out that the original prior auth was denied. The nice rep helped me enter an appeal, so we need to fax office info to [phone] and submit the info under reference [id number]. I will leave samples of Dexilant for her at the front desk until we know something. I have a note to Dr Jarold Motto to substitute Robinul Forte.

## 2012-11-09 NOTE — Telephone Encounter (Signed)
Faxed records

## 2012-11-10 ENCOUNTER — Other Ambulatory Visit: Payer: Self-pay | Admitting: *Deleted

## 2012-11-10 MED ORDER — GLYCOPYRROLATE 2 MG PO TABS: ORAL_TABLET | ORAL | Status: DC

## 2012-11-10 NOTE — Progress Notes (Signed)
lmom for pt that the Dexilant was approved and the script went thru at [facility]. Ordered Robinul Forte instead of Librax.

## 2012-11-11 ENCOUNTER — Encounter: Payer: Medicare Other | Admitting: Gastroenterology

## 2012-11-16 ENCOUNTER — Encounter: Payer: Self-pay | Admitting: Family Medicine

## 2012-11-30 ENCOUNTER — Other Ambulatory Visit: Payer: Self-pay | Admitting: Family Medicine

## 2012-12-01 ENCOUNTER — Other Ambulatory Visit: Payer: Self-pay | Admitting: Family Medicine

## 2012-12-01 NOTE — Telephone Encounter (Signed)
Rx Refilled  

## 2012-12-15 ENCOUNTER — Ambulatory Visit (INDEPENDENT_AMBULATORY_CARE_PROVIDER_SITE_OTHER): Payer: Medicare Other | Admitting: Surgery

## 2012-12-27 ENCOUNTER — Other Ambulatory Visit: Payer: Self-pay | Admitting: Family Medicine

## 2012-12-27 MED ORDER — ALPRAZOLAM 0.5 MG PO TABS: 0.5000 mg | ORAL_TABLET | Freq: Three times a day (TID) | ORAL | Status: DC | PRN

## 2012-12-27 NOTE — Telephone Encounter (Signed)
Last refill 12/01/12  #30  rx q8hr prn  Need approval for controlled medication.

## 2012-12-27 NOTE — Telephone Encounter (Signed)
Ok to refill after 5/24

## 2012-12-27 NOTE — Telephone Encounter (Signed)
Ok to fill on 5/24

## 2012-12-28 MED ORDER — ALPRAZOLAM 0.5 MG PO TABS: 0.5000 mg | ORAL_TABLET | Freq: Three times a day (TID) | ORAL | Status: DC | PRN

## 2012-12-28 NOTE — Telephone Encounter (Signed)
Refill charted for 5/20 but unable to tell if really called in so med called in again this morning.

## 2013-01-06 ENCOUNTER — Ambulatory Visit (INDEPENDENT_AMBULATORY_CARE_PROVIDER_SITE_OTHER): Payer: Medicare Other | Admitting: Surgery

## 2013-01-12 ENCOUNTER — Ambulatory Visit (INDEPENDENT_AMBULATORY_CARE_PROVIDER_SITE_OTHER): Payer: Medicare Other | Admitting: Surgery

## 2013-01-12 ENCOUNTER — Encounter (INDEPENDENT_AMBULATORY_CARE_PROVIDER_SITE_OTHER): Payer: Self-pay | Admitting: Surgery

## 2013-01-12 VITALS — BP 118/68 | HR 62 | Temp 97.6°F | Resp 14 | Ht 61.0 in | Wt 245.0 lb

## 2013-01-12 DIAGNOSIS — E669 Obesity, unspecified: Secondary | ICD-10-CM

## 2013-01-12 DIAGNOSIS — K449 Diaphragmatic hernia without obstruction or gangrene: Secondary | ICD-10-CM

## 2013-01-12 NOTE — Progress Notes (Signed)
Chief Complaint:  GERD symptoms with voice change over 2 years  History of Present Illness:  Sara [name] is an 32 y.o. female is seen following a workup by Dr. Jarold Motto which showed a 3 cm hiatus hernia by endo and refractory reflux.  Her BMI is above 40 and I discussed bariatric surgery intervention in addition to discussing Nissen fundoplication. Before embarking on antireflux surgery I think we need to get an upper GI series to assess the size of her hiatal hernia., Give her a booklet both on Nissen fundoplication and also booklet on lap band to consider. In the meantime we will get an upper GI.  She states she's become more symptomatic occurred over the last 2 years. She coughs at night despite elevating the head of her bed.    Past Medical History  Diagnosis Date  . Hyperlipidemia   . Obesity   . Obstructive sleep apnea     Insomnia; utilizes no therapy  . Hypertension   . GERD (gastroesophageal reflux disease)   . Vitamin D deficiency   . Transaminase or LDH elevation 04/24/2009  . Chronic back pain   . 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 Fasting hyperglycemia 05/12/2012  . Heart murmur, systolic   . Stroke     2008  . Erosive esophagitis   . Neuromuscular disorder     Past Surgical History  Procedure Laterality Date  . Foot surgery  05/2009    Dr Ramos(Left ankle)  . Esophageal manometry N/A 11/07/2012    Procedure: ESOPHAGEAL MANOMETRY (EM);  Surgeon: Mardella Layman, MD;  Location: [facility];  Service: Endoscopy;  Laterality: N/A;    Current Outpatient Prescriptions  Medication Sig Dispense Refill  . ALPRAZolam (XANAX) 0.5 MG tablet Take 1 tablet (0.5 mg total) by mouth 3 (three) times daily as needed for sleep.  30 tablet  0  . atorvastatin (LIPITOR) 80 MG tablet Take 1 tablet (80 mg total) by mouth daily.  30 tablet  11  . CYMBALTA 60 MG capsule Take 60 mg by mouth daily.       Marland Kitchen  dexlansoprazole (DEXILANT) 60 MG capsule Take 1 capsule (60 mg total) by mouth daily.  30 capsule  3  . gabapentin (NEURONTIN) 300 MG capsule Take 300 mg by mouth 2 (two) times daily.       Marland Kitchen glycopyrrolate (ROBINUL-FORTE) 2 MG tablet Take one tablet or 2 mg twice daily when needed for abdominal cramping.  60 tablet  3  . lisinopril (PRINIVIL,ZESTRIL) 20 MG tablet Take 20 mg by mouth daily.      Marland Kitchen oxyCODONE-acetaminophen (PERCOCET) 10-325 MG per tablet        No current facility-administered medications for this visit.   Review of patient's allergies indicates no known allergies. Family History  Problem Relation Age of Onset  . Diabetes Father   . Hypertension Father   . Multiple sclerosis Brother   . Asthma Other   . Breast cancer Mother   . Heart attack Mother   . Lung cancer Father   . Colon cancer Neg Hx    Social History:   reports that she has never smoked. She has never used smokeless tobacco. She reports that she does not drink alcohol or use illicit drugs.   REVIEW OF SYSTEMS - PERTINENT POSITIVES ONLY: noncontributory  Physical Exam:   Blood pressure 118/68, pulse 62, temperature 97.6 F (36.4 C), temperature source Temporal, resp. rate  14, height 5\' 1"  (1.549 m), weight 245 lb (111.131 kg). Body mass index is 46.32 kg/(m^2).  Gen:  WDWN WF NAD  Neurological: Alert and oriented to person, place, and time. Motor and sensory function is grossly intact  Head: Normocephalic and atraumatic.  Eyes: Conjunctivae are normal. Pupils are equal, round, and reactive to light. No scleral icterus.  Neck: Normal range of motion. Neck supple. No tracheal deviation or thyromegaly present.  Cardiovascular:  SR without murmurs or gallops.  No carotid bruits Respiratory: Effort normal.  No respiratory distress. No chest wall tenderness. Breath sounds normal.  No wheezes, rales or rhonchi.  Abdomen:  nontender GU: Musculoskeletal: Normal range of motion. Extremities are nontender. No  cyanosis, edema or clubbing noted Lymphadenopathy: No cervical, preauricular, postauricular or axillary adenopathy is present Skin: Skin is warm and dry. No rash noted. No diaphoresis. No erythema. No pallor. Pscyh: Normal mood and affect. Behavior is normal. Judgment and thought content normal.   LABORATORY RESULTS: No results found for this or any previous visit (from the past 48 hour(s)).  RADIOLOGY RESULTS: No results found.  Problem List: Patient Active Problem List   Diagnosis Date Noted  . Internal hemorrhoids 10/28/2012  . Rectal bleeding 10/28/2012  . Anal fissure 10/28/2012  . Fasting hyperglycemia 05/12/2012  . Heart murmur, systolic   . Hyperlipidemia   . Obesity   . Obstructive sleep apnea   . Hypertension   . 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  Will get endoscopy and have discussion about possible bariatric intervention.      Matt B. Daphine Deutscher, MD, [facility] [phone] beeper [phone]  01/12/2013 11:36 AM

## 2013-01-17 ENCOUNTER — Ambulatory Visit
Admission: RE | Admit: 2013-01-17 | Discharge: 2013-01-17 | Disposition: A | Discharge status: Home / Self Care | Payer: Medicare Other | Source: Ambulatory Visit | Attending: Surgery | Admitting: Surgery

## 2013-01-17 DIAGNOSIS — K449 Diaphragmatic hernia without obstruction or gangrene: Secondary | ICD-10-CM

## 2013-01-19 ENCOUNTER — Other Ambulatory Visit: Payer: Self-pay | Admitting: Family Medicine

## 2013-01-23 ENCOUNTER — Ambulatory Visit: Payer: Medicare Other | Admitting: Cardiology

## 2013-01-26 ENCOUNTER — Encounter (INDEPENDENT_AMBULATORY_CARE_PROVIDER_SITE_OTHER): Payer: Self-pay | Admitting: Surgery

## 2013-01-26 ENCOUNTER — Ambulatory Visit (INDEPENDENT_AMBULATORY_CARE_PROVIDER_SITE_OTHER): Payer: Medicare Other | Admitting: Surgery

## 2013-01-26 DIAGNOSIS — G4733 Obstructive sleep apnea (adult) (pediatric): Secondary | ICD-10-CM

## 2013-01-26 DIAGNOSIS — Z6841 Body Mass Index (BMI) 40.0 and over, adult: Secondary | ICD-10-CM

## 2013-01-26 DIAGNOSIS — I10 Essential (primary) hypertension: Secondary | ICD-10-CM

## 2013-01-26 DIAGNOSIS — E785 Hyperlipidemia, unspecified: Secondary | ICD-10-CM

## 2013-01-30 ENCOUNTER — Other Ambulatory Visit: Payer: Self-pay | Admitting: Family Medicine

## 2013-01-30 MED ORDER — ALPRAZOLAM 0.5 MG PO TABS: 0.5000 mg | ORAL_TABLET | Freq: Three times a day (TID) | ORAL | Status: DC | PRN

## 2013-02-01 ENCOUNTER — Telehealth (INDEPENDENT_AMBULATORY_CARE_PROVIDER_SITE_OTHER): Payer: Self-pay

## 2013-02-01 ENCOUNTER — Encounter (INDEPENDENT_AMBULATORY_CARE_PROVIDER_SITE_OTHER): Payer: Self-pay | Admitting: Surgery

## 2013-02-01 NOTE — Telephone Encounter (Signed)
LMOM for pt letting her know that I received her email about the psychiatric evaluation.  I stated that she may contact Enzo Bi, PhD with [facility] [phone]) to schedule.

## 2013-02-03 NOTE — Progress Notes (Signed)
Chief Complaint: GERD symptoms with voice change over 2 years  History of Present Illness: Sara [name] is an 32 y.o. female is seen following a workup by Dr. Jarold Motto which showed a 3 cm hiatus hernia by endo and refractory reflux. Her BMI is above 40 and I discussed bariatric surgery intervention in addition to discussing Nissen fundoplication. Before embarking on antireflux surgery I think we need to get an upper GI series to assess the size of her hiatal hernia., Give her a booklet both on Nissen fundoplication and also booklet on lap band to consider. In the meantime we will get an upper GI.  She states she's become more symptomatic occurred over the last 2 years. She coughs at night despite elevating the head of her bed.  Past Medical History   Diagnosis  Date   .  Hyperlipidemia    .  Obesity    .  Obstructive sleep apnea      Insomnia; utilizes no therapy   .  Hypertension    .  GERD (gastroesophageal reflux disease)    .  Vitamin D deficiency    .  Transaminase or LDH elevation  04/24/2009   .  Chronic back pain    .  Cerebrovascular disease  06/2007     RIND in 2008 with transient right-sided weakness and dysarthria   .  Fasting hyperglycemia  05/12/2012   .  Heart murmur, systolic    .  Stroke      2008   .  Erosive esophagitis    .  Neuromuscular disorder     Past Surgical History   Procedure  Laterality  Date   .  Foot surgery   05/2009     Dr Ramos(Left ankle)   .  Esophageal manometry  N/A  11/07/2012     Procedure: ESOPHAGEAL MANOMETRY (EM); Surgeon: Mardella Layman, MD; Location: [facility]; Service: Endoscopy; Laterality: N/A;    Current Outpatient Prescriptions   Medication  Sig  Dispense  Refill   .  ALPRAZolam (XANAX) 0.5 MG tablet  Take 1 tablet (0.5 mg total) by mouth 3 (three) times daily as needed for sleep.  30 tablet  0   .  atorvastatin (LIPITOR) 80 MG tablet  Take 1 tablet (80 mg total) by mouth daily.  30 tablet  11   .  CYMBALTA 60 MG capsule   Take 60 mg by mouth daily.     Marland Kitchen  dexlansoprazole (DEXILANT) 60 MG capsule  Take 1 capsule (60 mg total) by mouth daily.  30 capsule  3   .  gabapentin (NEURONTIN) 300 MG capsule  Take 300 mg by mouth 2 (two) times daily.     Marland Kitchen  glycopyrrolate (ROBINUL-FORTE) 2 MG tablet  Take one tablet or 2 mg twice daily when needed for abdominal cramping.  60 tablet  3   .  lisinopril (PRINIVIL,ZESTRIL) 20 MG tablet  Take 20 mg by mouth daily.     Marland Kitchen  oxyCODONE-acetaminophen (PERCOCET) 10-325 MG per tablet       No current facility-administered medications for this visit.   Review of patient's allergies indicates no known allergies.  Family History   Problem  Relation  Age of Onset   .  Diabetes  Father    .  Hypertension  Father    .  Multiple sclerosis  Brother    .  Asthma  Other    .  Breast cancer  Mother    .  Heart attack  Mother    .  Lung cancer  Father    .  Colon cancer  Neg Hx    Social History: reports that she has never smoked. She has never used smokeless tobacco. She reports that she does not drink alcohol or use illicit drugs.  REVIEW OF SYSTEMS - PERTINENT POSITIVES ONLY:  noncontributory  Physical Exam:  Blood pressure 118/68, pulse 62, temperature 97.6 F (36.4 C), temperature source Temporal, resp. rate 14, height 5\' 1"  (1.549 m), weight 245 lb (111.131 kg).  Body mass index is 46.32 kg/(m^2).  Gen: WDWN WF NAD  Neurological: Alert and oriented to person, place, and time. Motor and sensory function is grossly intact  Head: Normocephalic and atraumatic.  Eyes: Conjunctivae are normal. Pupils are equal, round, and reactive to light. No scleral icterus.  Neck: Normal range of motion. Neck supple. No tracheal deviation or thyromegaly present.  Cardiovascular: SR without murmurs or gallops. No carotid bruits  Respiratory: Effort normal. No respiratory distress. No chest wall tenderness. Breath sounds normal. No wheezes, rales or rhonchi.  Abdomen: nontender  GU:  Musculoskeletal:  Normal range of motion. Extremities are nontender. No cyanosis, edema or clubbing noted Lymphadenopathy: No cervical, preauricular, postauricular or axillary adenopathy is present Skin: Skin is warm and dry. No rash noted. No diaphoresis. No erythema. No pallor. Pscyh: Normal mood and affect. Behavior is normal. Judgment and thought content normal.  LABORATORY RESULTS:  No results found for this or any previous visit (from the past 48 hour(s)).  RADIOLOGY RESULTS:  No results found.  Problem List:  Patient Active Problem List    Diagnosis  Date Noted   .  Internal hemorrhoids  10/28/2012   .  Rectal bleeding  10/28/2012   .  Anal fissure  10/28/2012   .  Fasting hyperglycemia  05/12/2012   .  Heart murmur, systolic    .  Hyperlipidemia    .  Obesity    .  Obstructive sleep apnea    .  Hypertension    .  Reversible ischemic neurologic deficit  07/03/2007   Assessment & Plan:  GERD with small hiatus hernia on endo. Will get endoscopy and have discussion about possible bariatric intervention.  Matt B. Daphine Deutscher, MD, [facility]  [phone] beeper  [phone]  01/12/2013 11:36 AM

## 2013-02-19 ENCOUNTER — Ambulatory Visit (HOSPITAL_BASED_OUTPATIENT_CLINIC_OR_DEPARTMENT_OTHER): Payer: Medicare Other

## 2013-02-25 ENCOUNTER — Ambulatory Visit: Payer: Medicare Other | Admitting: *Deleted

## 2013-02-27 ENCOUNTER — Other Ambulatory Visit: Payer: Self-pay | Admitting: Family Medicine

## 2013-02-28 ENCOUNTER — Ambulatory Visit: Payer: Medicare Other | Admitting: *Deleted

## 2013-02-28 ENCOUNTER — Other Ambulatory Visit: Payer: Self-pay | Admitting: Family Medicine

## 2013-02-28 MED ORDER — ALPRAZOLAM 0.5 MG PO TABS: ORAL_TABLET | ORAL | Status: DC

## 2013-02-28 NOTE — Telephone Encounter (Signed)
Rx Refilled  

## 2013-03-06 ENCOUNTER — Ambulatory Visit (HOSPITAL_BASED_OUTPATIENT_CLINIC_OR_DEPARTMENT_OTHER): Payer: Medicare Other | Attending: Surgery | Admitting: Radiology

## 2013-03-06 ENCOUNTER — Encounter (INDEPENDENT_AMBULATORY_CARE_PROVIDER_SITE_OTHER): Payer: Self-pay | Admitting: Surgery

## 2013-03-06 VITALS — Ht 61.0 in | Wt 250.0 lb

## 2013-03-06 DIAGNOSIS — I10 Essential (primary) hypertension: Secondary | ICD-10-CM | POA: Insufficient documentation

## 2013-03-06 DIAGNOSIS — G4733 Obstructive sleep apnea (adult) (pediatric): Secondary | ICD-10-CM

## 2013-03-06 DIAGNOSIS — E669 Obesity, unspecified: Secondary | ICD-10-CM | POA: Insufficient documentation

## 2013-03-07 ENCOUNTER — Encounter (INDEPENDENT_AMBULATORY_CARE_PROVIDER_SITE_OTHER): Payer: Self-pay | Admitting: Surgery

## 2013-03-08 ENCOUNTER — Ambulatory Visit (HOSPITAL_COMMUNITY): Admission: RE | Admit: 2013-03-08 | Payer: Medicare Other | Source: Ambulatory Visit | Admitting: Surgery

## 2013-03-08 ENCOUNTER — Encounter (HOSPITAL_COMMUNITY): Admission: RE | Payer: Self-pay | Source: Ambulatory Visit

## 2013-03-08 SURGERY — BREATH TEST, FOR HELICOBACTER PYLORI

## 2013-03-13 ENCOUNTER — Other Ambulatory Visit: Payer: Self-pay | Admitting: *Deleted

## 2013-03-13 ENCOUNTER — Encounter (HOSPITAL_COMMUNITY): Payer: Self-pay

## 2013-03-13 ENCOUNTER — Ambulatory Visit (HOSPITAL_COMMUNITY)
Admission: RE | Admit: 2013-03-13 | Discharge: 2013-03-13 | Disposition: A | Discharge status: Home / Self Care | Payer: Medicare Other | Source: Ambulatory Visit | Attending: Surgery | Admitting: Surgery

## 2013-03-13 ENCOUNTER — Other Ambulatory Visit: Payer: Self-pay

## 2013-03-13 ENCOUNTER — Encounter: Payer: Self-pay | Admitting: *Deleted

## 2013-03-13 ENCOUNTER — Encounter: Payer: Self-pay | Admitting: Family Medicine

## 2013-03-13 ENCOUNTER — Encounter: Payer: Medicare Other | Attending: Surgery | Admitting: *Deleted

## 2013-03-13 VITALS — Ht 61.0 in | Wt 246.0 lb

## 2013-03-13 DIAGNOSIS — I10 Essential (primary) hypertension: Secondary | ICD-10-CM

## 2013-03-13 DIAGNOSIS — E785 Hyperlipidemia, unspecified: Secondary | ICD-10-CM | POA: Insufficient documentation

## 2013-03-13 DIAGNOSIS — E559 Vitamin D deficiency, unspecified: Secondary | ICD-10-CM | POA: Insufficient documentation

## 2013-03-13 DIAGNOSIS — K7689 Other specified diseases of liver: Secondary | ICD-10-CM | POA: Insufficient documentation

## 2013-03-13 DIAGNOSIS — Z6841 Body Mass Index (BMI) 40.0 and over, adult: Secondary | ICD-10-CM | POA: Insufficient documentation

## 2013-03-13 DIAGNOSIS — Z713 Dietary counseling and surveillance: Secondary | ICD-10-CM | POA: Insufficient documentation

## 2013-03-13 DIAGNOSIS — K219 Gastro-esophageal reflux disease without esophagitis: Secondary | ICD-10-CM | POA: Insufficient documentation

## 2013-03-13 DIAGNOSIS — E669 Obesity, unspecified: Secondary | ICD-10-CM | POA: Insufficient documentation

## 2013-03-13 DIAGNOSIS — G4733 Obstructive sleep apnea (adult) (pediatric): Secondary | ICD-10-CM | POA: Insufficient documentation

## 2013-03-13 NOTE — Telephone Encounter (Signed)
appt was made and mess sent to patient

## 2013-03-13 NOTE — Progress Notes (Signed)
  Pre-Op Assessment Visit:  Pre-Operative LAGB Surgery  Medical Nutrition Therapy:  Appt start time:  1030   End time:  1130.  Patient was seen on 03/13/2013 for Pre-Operative LAGB Nutrition Assessment. Assessment and letter of approval faxed to [facility] Bariatric Surgery Program coordinator on 03/13/2013.   Handouts given during visit include:  Pre-Op Goals Bariatric Surgery Protein Shakes  Patient to call the [facility] to enroll in Pre-Op and Post-Op Nutrition Education when surgery date is scheduled.

## 2013-03-13 NOTE — Patient Instructions (Addendum)
   Follow Pre-Op Nutrition Goals to prepare for Lapband Surgery.   Call the [facility] at [phone] once you have been given your surgery date to enrolled in the Pre-Op Nutrition Class. You will need to attend this nutrition class 3-4 weeks prior to your surgery. 

## 2013-03-14 ENCOUNTER — Ambulatory Visit: Payer: Medicare Other | Admitting: Psychiatry

## 2013-03-16 ENCOUNTER — Encounter: Payer: Self-pay | Admitting: Pulmonary Disease

## 2013-03-16 ENCOUNTER — Ambulatory Visit (INDEPENDENT_AMBULATORY_CARE_PROVIDER_SITE_OTHER): Payer: Medicare Other | Admitting: Pulmonary Disease

## 2013-03-16 VITALS — BP 100/70 | HR 71 | Temp 97.1°F | Ht 61.0 in | Wt 243.1 lb

## 2013-03-16 DIAGNOSIS — G4733 Obstructive sleep apnea (adult) (pediatric): Secondary | ICD-10-CM

## 2013-03-16 NOTE — Assessment & Plan Note (Signed)
She has snoring, sleep disruption, witnessed apnea, and daytime sleepiness.  She has history of hypertension.  She has mild sleep apnea on her recent sleep study.  I have reviewed the recent sleep study results with the patient.  We discussed how sleep apnea can affect various health problems including risks for hypertension, cardiovascular disease, and diabetes.  We also discussed how sleep disruption can increase risks for accident, such as while driving.  Weight loss as a means of improving sleep apnea was also reviewed.  Additional treatment options discussed were CPAP therapy, oral appliance, and surgical intervention.  Will arrange for auto CPAP set up.

## 2013-03-16 NOTE — Patient Instructions (Signed)
Will arrange for CPAP set up at home Follow up in 2 months after CPAP set up 

## 2013-03-16 NOTE — Progress Notes (Signed)
Chief Complaint  Patient presents with  . sleep apnea consult    sleep study done 03/09/13 at [facility]. Patient is snoring, waking up with choking, feeling like she hasn't slept at all when waking up.    History of Present Illness: Sara [name] is a 32 y.o. female for evaluation of sleep problems.  She is being evaluated for bariatric surgery.  During evaluation there was concern for sleep apnea.  She had sleep study which showed mild sleep apnea.  She is referred to pulmonary/sleep for further assessment.  She goes to sleep at between 9 and 11 pm.  She falls asleep after an hour.  She wakes up several times to use the bathroom.  She gets out of bed at 6 am.  She feels tired in the morning, and this feeling persists throughout the day.  She denies morning headache.  She does not use anything to help her fall sleep.  She will drink sodas to help stay awake.  She snores, and wakes feeling choked.  She has trouble sleeping on her back.  Her mouth gets dry at night.  She will sometimes talk in her sleep.  She denies sleep talking, bruxism, or nightmares.  There is no history of restless legs.  She denies sleep hallucinations, sleep paralysis, or cataplexy.  The Epworth score is 14 out of 24.   Tests: PSG 03/06/13 >> AHI 1.2, RDI 9.7, SpO2 low 90%.  Sara [name]  has a past medical history of Hyperlipidemia; Obesity; Obstructive sleep apnea; Hypertension; GERD (gastroesophageal reflux disease); Vitamin D deficiency; Transaminase or LDH elevation (04/24/2009); Chronic back pain; Cerebrovascular disease (06/2007); Fasting hyperglycemia (05/12/2012); Heart murmur, systolic; Stroke; Erosive esophagitis; and Neuromuscular disorder.  Sara [name]  has past surgical history that includes Foot surgery (05/2009) and Esophageal manometry (N/A, 11/07/2012).  Prior to Admission medications   Medication Sig Start Date End Date Taking? Authorizing Provider  ALPRAZolam Prudy Feeler) 0.5 MG tablet TAKE ONE TABLET  BY MOUTH AT BEDTIME AS NEEDED 02/28/13  Yes Donita Brooks, MD  atorvastatin (LIPITOR) 80 MG tablet Take 1 tablet (80 mg total) by mouth daily. 04/25/12  Yes Kathlen Brunswick, MD  CYMBALTA 60 MG capsule Take 60 mg by mouth daily.  04/12/12  Yes Historical Provider, MD  dexlansoprazole (DEXILANT) 60 MG capsule Take 1 capsule (60 mg total) by mouth daily. 10/31/12  Yes Mardella Layman, MD  gabapentin (NEURONTIN) 300 MG capsule Take 300 mg by mouth 3 (three) times daily.  04/12/12  Yes Historical Provider, MD  lisinopril (PRINIVIL,ZESTRIL) 20 MG tablet Take 20 mg by mouth daily.   Yes Historical Provider, MD  oxyCODONE-acetaminophen (PERCOCET) 10-325 MG per tablet  01/06/13  Yes Historical Provider, MD    No Known Allergies  Her family history includes Asthma in her other; Breast cancer in her mother; Diabetes in her father; Heart attack in her mother; Hypertension in her father; Lung cancer in her father; and Multiple sclerosis in her brother.  There is no history of Colon cancer.  She  reports that she has never smoked. She has never used smokeless tobacco. She reports that she does not drink alcohol or use illicit drugs.   Physical Exam:  General - No distress ENT - No sinus tenderness, MP 4, enlarged tongue, no oral exudate, no LAN, no thyromegaly, TM clear, pupils equal/reactive Cardiac - s1s2 regular, no murmur, pulses symmetric Chest - No wheeze/rales/dullness, good air entry, normal respiratory excursion Back - No focal tenderness Abd -  Soft, non-tender, no organomegaly, + bowel sounds Ext - No edema Neuro - Normal strength, cranial nerves intact Skin - No rashes Psych - Normal mood, and behavior  Assessment:  Coralyn Helling, MD [facility] Pulmonary/Critical Care 03/16/2013, 1:49 PM Pager:  [phone] After 3pm call: [phone]

## 2013-03-17 ENCOUNTER — Ambulatory Visit: Payer: Medicare Other | Admitting: Family Medicine

## 2013-03-17 NOTE — Procedures (Signed)
[name], [name]               ACCOUNT NO.:  [id number]  MEDICAL RECORD NO.:  [id number]          PATIENT TYPE:  OUT  LOCATION:  SLEEP CENTER                 FACILITY:  [facility]  PHYSICIAN:  Coralyn Helling, MD        DATE OF BIRTH:  25-May-1981  DATE OF STUDY:  03/06/2013                           NOCTURNAL POLYSOMNOGRAM  REFERRING PHYSICIAN:  Thornton Park. Daphine Deutscher, MD  INDICATION FOR STUDY:  [name] is a 32 year old female who has a history of hypertension and obesity.  She also reports snoring, sleep disruption, and daytime sleepiness.  She is referred to the sleep lab for further evaluation of hypersomnia with obstructive sleep apnea.  Height is 5 feet 1 inches, weight is 250 pounds, BMI is 27, neck size 15.5 inches.  EPWORTH SLEEPINESS SCORE:  17.  MEDICATIONS:  Medications are reviewed in the chart.  The patient took a Cymbalta, Neurontin, and Xanax on the night of study.  SLEEP ARCHITECTURE:  Total recording time was 377 minutes.  Total sleep time was 338 minutes.  Sleep efficiency was 89%.  Sleep latency was 1 minute.  REM latency was 328 minutes.  The study was notable for a reduction in the percentage of slow-wave sleep.  She slept in both the supine and non-supine positions.  RESPIRATORY DATA:  The average respiratory rate was 16.  Loud snoring was noted by the technician.  The overall apnea/hypopnea index was 1.2. The respiratory disturbance index was 9.7.  There was 1 central apneic event.  The remainder of the events were obstructive in nature.  OXYGEN DATA:  The baseline oxygenation was 98%.  The oxygen saturation nadir was 90%.  The study was conducted without the use of supplemental oxygen.  CARDIAC DATA:  The average heart rate was 75 and the rhythm strip showed sinus rhythm.  MOVEMENT-PARASOMNIA:  The periodic limb movement index was 2.1.  The patient had no restroom trips.  IMPRESSIONS-RECOMMENDATIONS:  This study shows evidence for mild obstructive sleep  apnea with a respiratory disturbance index of 9.7.  Additional therapeutic options include weight reduction, CPAP therapy, oral appliance, or surgical intervention.     Coralyn Helling, MD Diplomat, American Board of Sleep Medicine    VS/MEDQ  D:  03/16/2013 11:38:55  T:  03/17/2013 01:52:43  Job:  [id number]

## 2013-03-30 ENCOUNTER — Encounter: Payer: Self-pay | Admitting: Gastroenterology

## 2013-03-30 MED ORDER — DEXLANSOPRAZOLE 60 MG PO CPDR: 60.0000 mg | DELAYED_RELEASE_CAPSULE | Freq: Every day | ORAL | Status: DC

## 2013-03-30 NOTE — Telephone Encounter (Addendum)
Kamee, keep up the good work! You sound very determined in your weight loss efforts and I will forward this to Dr Jarold Motto. Ordered Dexilant to [facility] in [place]. Graciella Freer, RN

## 2013-03-30 NOTE — Addendum Note (Signed)
Addended by: Florene Glen on: 03/30/2013 11:32 AM   Modules accepted: Orders

## 2013-03-31 ENCOUNTER — Ambulatory Visit (INDEPENDENT_AMBULATORY_CARE_PROVIDER_SITE_OTHER): Payer: Medicare Other | Admitting: Family Medicine

## 2013-03-31 ENCOUNTER — Encounter: Payer: Self-pay | Admitting: Family Medicine

## 2013-03-31 VITALS — BP 110/70 | HR 80 | Temp 97.8°F | Resp 18 | Ht 62.0 in | Wt 244.0 lb

## 2013-03-31 DIAGNOSIS — F411 Generalized anxiety disorder: Secondary | ICD-10-CM

## 2013-03-31 DIAGNOSIS — F419 Anxiety disorder, unspecified: Secondary | ICD-10-CM | POA: Insufficient documentation

## 2013-03-31 MED ORDER — ALPRAZOLAM 0.5 MG PO TABS: ORAL_TABLET | ORAL | Status: DC

## 2013-03-31 MED ORDER — BUPROPION HCL ER (XL) 150 MG PO TB24: 150.0000 mg | ORAL_TABLET | Freq: Every day | ORAL | Status: DC

## 2013-03-31 NOTE — Progress Notes (Signed)
Subjective:    [name], female    DOB: 09-04-1980, 32 y.o.   MRN: [id number]  HPI Patient suffered the death of her daughter several years ago. Ever since she has had a problem with anxiety and depression. She has been on Cymbalta 60 mg by mouth daily for several years.  Over the last year, the patient is having frequent anxiety attacks. She states they occur on a daily basis. She is taking Xanax 0.5 mg tablets. She has to take 2 tablets at a time to control the anxiety. She is doing this on an every other day basis. She is also previously tried Zoloft without benefit. She's to a receiving 30 Xanax every month. She is here today to discuss other options to help control and prevent her anxiety attacks. She has no history of seizures.  At present she is seeing Dr. Daphine Deutscher to discuss a Niesen fundoplication to control the severe esophageal reflux and erosive esophagitis stemming from her hiatal hernia. Past Medical History  Diagnosis Date  . Hyperlipidemia   . Obesity   . Obstructive sleep apnea     Insomnia; utilizes no therapy  . Hypertension   . GERD (gastroesophageal reflux disease)   . Vitamin D deficiency   . Transaminase or LDH elevation 04/24/2009  . Chronic back pain   . 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 Fasting hyperglycemia 05/12/2012  . Heart murmur, systolic   . Stroke     2008  . Erosive esophagitis   . Neuromuscular disorder   . Depression   . Anxiety    Past Surgical History  Procedure Laterality Date  . Foot surgery  05/2009    Dr Ramos(Left ankle)  . Esophageal manometry N/A 11/07/2012    Procedure: ESOPHAGEAL MANOMETRY (EM);  Surgeon: Mardella Layman, MD;  Location: [facility];  Service: Endoscopy;  Laterality: N/A;   Current Outpatient Prescriptions on File Prior to Visit  Medication Sig Dispense Refill  . atorvastatin (LIPITOR) 80 MG tablet Take 1 tablet (80 mg total) by  mouth daily.  30 tablet  11  . CYMBALTA 60 MG capsule Take 60 mg by mouth daily.       Marland Kitchen dexlansoprazole (DEXILANT) 60 MG capsule Take 1 capsule (60 mg total) by mouth daily.  30 capsule  6  . gabapentin (NEURONTIN) 300 MG capsule Take 300 mg by mouth 3 (three) times daily.       Marland Kitchen lisinopril (PRINIVIL,ZESTRIL) 20 MG tablet Take 20 mg by mouth daily.      Marland Kitchen oxyCODONE-acetaminophen (PERCOCET) 10-325 MG per tablet 1 tablet. Prn foot pain       No current facility-administered medications on file prior to visit.   No Known Allergies History   Social History  . Marital Status: Single    Spouse Name: N/A    Number of Children: 1  . Years of Education: N/A   Occupational History  . Disabled     Social History Main Topics  . Smoking status: Never Smoker   . Smokeless tobacco: Never Used  . Alcohol Use: No  . Drug Use: No  . Sexual Activity: Yes   Other Topics Concern  . Not on file   Social History Narrative   Stopped caffeine    Family History  Problem Relation Age of Onset  . Diabetes Father   . Hypertension Father   . Multiple sclerosis Brother   . Asthma  Other   . Breast cancer Mother   . Heart attack Mother   . Lung cancer Father   . Colon cancer Neg Hx       Review of Systems  All other systems reviewed and are negative.       Objective:   Physical Exam  Vitals reviewed. Constitutional: She is oriented to person, place, and time.  Neck: Neck supple. No thyromegaly present.  Cardiovascular: Normal rate, regular rhythm, normal heart sounds and intact distal pulses.  Exam reveals no gallop and no friction rub.   No murmur heard. Pulmonary/Chest: Effort normal and breath sounds normal. No respiratory distress. She has no wheezes. She has no rales. She exhibits no tenderness.  Abdominal: Soft. Bowel sounds are normal. She exhibits no distension. There is no tenderness. There is no rebound and no guarding.  Lymphadenopathy:    She has no cervical adenopathy.   Neurological: She is alert and oriented to person, place, and time. No cranial nerve deficit.  Psychiatric: She has a normal mood and affect. Her behavior is normal. Judgment and thought content normal.          Assessment & Plan:  1. GAD (generalized anxiety disorder) Continue Cymbalta 60 mg by mouth daily. Add Wellbutrin XL 150 mg by mouth every morning in an attempt to augment Cymbalta's effect and help control and prevent her anxiety attacks. I also refilled her Xanax 0.5 mg tablets q. 8 hours when necessary for anxiety attack. I gave her 30 tablets to be used per month. I am hoping we can decrease the amount of Xanax the patient is requiring to prevent habituation and dependence later in life.  Recheck in one month.. - buPROPion (WELLBUTRIN XL) 150 MG 24 hr tablet; Take 1 tablet (150 mg total) by mouth daily.  Dispense: 30 tablet; Refill: 3

## 2013-04-06 ENCOUNTER — Encounter: Payer: Self-pay | Admitting: Gastroenterology

## 2013-04-11 ENCOUNTER — Encounter: Payer: Medicare Other | Attending: Surgery | Admitting: Dietician

## 2013-04-11 VITALS — Ht 61.0 in | Wt 239.5 lb

## 2013-04-11 DIAGNOSIS — E669 Obesity, unspecified: Secondary | ICD-10-CM | POA: Insufficient documentation

## 2013-04-11 DIAGNOSIS — Z713 Dietary counseling and surveillance: Secondary | ICD-10-CM | POA: Insufficient documentation

## 2013-04-11 NOTE — Patient Instructions (Addendum)
Goals:  Work on eating small portions of meals and add 2-3 snacks per day with protein if needed.  Keep up the good work!

## 2013-04-11 NOTE — Progress Notes (Signed)
   6 Months Supervised Weight Loss Visit:   Pre-Operative LAGB Surgery  Medical Nutrition Therapy:  Appt start time: 0845 end time:  0900.  Primary concerns today: Supervised Weight Loss Visit. Lost 6.5 pounds since 03/13/2013 by cutting back on fast food, drink more water, only one soda, no longer frying foods, and exercising 3 x weeks.   Weight: 239.5 BMI: 45.3  24-hr recall: B (AM): sausage and two boiled eggs and toast Snk ( AM): none  L (PM): [place] sandwich with watermelon or grapes Snk ( PM): none  D (PM): baked chicken and corn Snk (PM): none  Medications: no change  Recent physical activity:  3 x week going to gym doing the treadmill and bicycle.   Progress Towards Goal(s):  In progress.  Handouts given during visit include:  15g CHO Snack    Nutritional Diagnosis:  [name]-3.3 Overweight/obesity related to past poor dietary habits and physical inactivity as evidenced by patient with planned LAGB surgery and BMI of 45.3.  Intervention:  Nutrition counseling provided. Encouraged Sara [name] to keep up the good habits she has established and recommend having smaller portions at meal time with 2-3 snacks per day.   Monitoring/Evaluation:  Dietary intake, exercise, and body weight. Follow up in 1 months for 2 month supervised weight loss visit.

## 2013-04-12 ENCOUNTER — Ambulatory Visit: Payer: Medicare Other | Admitting: Psychiatry

## 2013-04-27 ENCOUNTER — Other Ambulatory Visit: Payer: Self-pay | Admitting: Family Medicine

## 2013-04-28 ENCOUNTER — Other Ambulatory Visit: Payer: Self-pay | Admitting: Family Medicine

## 2013-04-28 MED ORDER — ALPRAZOLAM 0.5 MG PO TABS: ORAL_TABLET | ORAL | Status: DC

## 2013-04-28 NOTE — Telephone Encounter (Signed)
Rx Refilled  

## 2013-05-01 ENCOUNTER — Encounter: Payer: Self-pay | Admitting: Family Medicine

## 2013-05-02 ENCOUNTER — Encounter: Payer: Self-pay | Admitting: *Deleted

## 2013-05-03 ENCOUNTER — Telehealth: Payer: Self-pay | Admitting: Pulmonary Disease

## 2013-05-03 NOTE — Telephone Encounter (Signed)
Auto CPAP 04/05/13 to 04/25/13 >> Used on 21 of 21 nights with average 5 hrs 25 min.  Average AHI 1.7 with median CPAP 6 cm H2O and 95 th percentile CPAP 9 cm H2O.  Will have my nurse inform pt that CPAP report looks good.  No change to current set up.  Will discuss in more detail at [facility] on 05/29/13.

## 2013-05-04 NOTE — Telephone Encounter (Signed)
Pt is aware of CPAP report results. 

## 2013-05-10 ENCOUNTER — Encounter: Payer: Medicare Other | Attending: Surgery | Admitting: Dietician

## 2013-05-10 VITALS — Ht 61.0 in | Wt 239.9 lb

## 2013-05-10 DIAGNOSIS — Z713 Dietary counseling and surveillance: Secondary | ICD-10-CM | POA: Insufficient documentation

## 2013-05-10 DIAGNOSIS — E669 Obesity, unspecified: Secondary | ICD-10-CM | POA: Insufficient documentation

## 2013-05-10 NOTE — Progress Notes (Signed)
   6 Months Supervised Weight Loss Visit:   Pre-Operative LAGB Surgery  Medical Nutrition Therapy:  Appt start time: 0800 end time:  0815.  Primary concerns today: Supervised Weight Loss Visit. Started trying protein shakes, though the Special K shakes are too high in carbs and too low in protein. Still exercising, eating smaller portion, and drinking one soda per day.   Weight: 238.9 Weight loss: 1 lb BMI: 45.3  24-hr recall: B (AM): sausage and two boiled eggs and toast Snk ( AM): none  L (PM): [place] sandwich with watermelon or grapes Snk ( PM): none  D (PM): baked chicken and corn Snk (PM): none  Medications: no change  Recent physical activity:  3 x week going to gym doing the treadmill and bicycle.   Progress Towards Goal(s):  In progress.  Handouts given during visit include:  15g CHO Snack    Nutritional Diagnosis:  [place]-3.3 Overweight/obesity related to past poor dietary habits and physical inactivity as evidenced by patient with planned LAGB surgery and BMI of 45.3.  Intervention:  Nutrition counseling provided. Recommended that Sara [name] work on adding snacks to her day and look for a protein shake with less CHO and more protein.   Monitoring/Evaluation:  Dietary intake, exercise, and body weight. Follow up in 1 months for 3 month supervised weight loss visit.

## 2013-05-10 NOTE — Patient Instructions (Addendum)
Goals:  Work on eating small portions of meals and add 2-3 snacks per day with protein if needed.  Look for protein shakes with less than 5 g carbs and 15 g protein.

## 2013-05-22 ENCOUNTER — Encounter: Payer: Medicare Other | Admitting: Cardiology

## 2013-05-22 ENCOUNTER — Encounter: Payer: Self-pay | Admitting: Cardiology

## 2013-05-22 NOTE — Progress Notes (Signed)
Clinical Summary Ms. Randa Evens is a 32 y.o.femaleseen in follow up today for the following problems.  1. HTN   2. HL   3. Heart murmur  4. History of TIA - no corresponding etiology on echo Past Medical History  Diagnosis Date  . Hyperlipidemia   . Obesity   . Obstructive sleep apnea     Insomnia; utilizes no therapy  . Hypertension   . GERD (gastroesophageal reflux disease)   . Vitamin D deficiency   . Transaminase or LDH elevation 04/24/2009  . Chronic back pain   . 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 Fasting hyperglycemia 05/12/2012  . Heart murmur, systolic   . Stroke     2008  . Erosive esophagitis   . Neuromuscular disorder   . Depression   . Anxiety      No Known Allergies   Current Outpatient Prescriptions  Medication Sig Dispense Refill  . ALPRAZolam (XANAX) 0.5 MG tablet TAKE ONE TABLET BY MOUTH AT BEDTIME AS NEEDED  30 tablet  0  . atorvastatin (LIPITOR) 80 MG tablet Take 1 tablet (80 mg total) by mouth daily.  30 tablet  11  . buPROPion (WELLBUTRIN XL) 150 MG 24 hr tablet Take 1 tablet (150 mg total) by mouth daily.  30 tablet  3  . CYMBALTA 60 MG capsule Take 60 mg by mouth daily.       Marland Kitchen dexlansoprazole (DEXILANT) 60 MG capsule Take 1 capsule (60 mg total) by mouth daily.  30 capsule  6  . gabapentin (NEURONTIN) 300 MG capsule Take 300 mg by mouth 3 (three) times daily.       Marland Kitchen lisinopril (PRINIVIL,ZESTRIL) 20 MG tablet Take 20 mg by mouth daily.      Marland Kitchen oxyCODONE-acetaminophen (PERCOCET) 10-325 MG per tablet 1 tablet. Prn foot pain       No current facility-administered medications for this visit.     Past Surgical History  Procedure Laterality Date  . Foot surgery  05/2009    Dr Ramos(Left ankle)  . Esophageal manometry N/A 11/07/2012    Procedure: ESOPHAGEAL MANOMETRY (EM);  Surgeon: Mardella Layman, MD;  Location: [facility];  Service: Endoscopy;  Laterality: N/A;      No Known Allergies    Family History  Problem Relation Age of Onset  . Diabetes Father   . Hypertension Father   . Multiple sclerosis Brother   . Asthma Other   . Breast cancer Mother   . Heart attack Mother   . Lung cancer Father   . Colon cancer Neg Hx      Social History [name] reports that she has never smoked. She has never used smokeless tobacco. Ms. Randa Evens reports that she does not drink alcohol.   Review of Systems CONSTITUTIONAL: No weight loss, fever, chills, weakness or fatigue.  HEENT: Eyes: No visual loss, blurred vision, double vision or yellow sclerae.No hearing loss, sneezing, congestion, runny nose or sore throat.  SKIN: No rash or itching.  CARDIOVASCULAR:  RESPIRATORY: No shortness of breath, cough or sputum.  GASTROINTESTINAL: No anorexia, nausea, vomiting or diarrhea. No abdominal pain or blood.  GENITOURINARY: No burning on urination, no polyuria NEUROLOGICAL: No headache, dizziness, syncope, paralysis, ataxia, numbness or tingling in the extremities. No change in bowel or bladder control.  MUSCULOSKELETAL: No muscle, back pain, joint pain or stiffness.  LYMPHATICS: No enlarged nodes. No history of splenectomy.  PSYCHIATRIC: No  history of depression or anxiety.  ENDOCRINOLOGIC: No reports of sweating, cold or heat intolerance. No polyuria or polydipsia.  Marland Kitchen   Physical Examination There were no vitals filed for this visit. There were no vitals filed for this visit.  Gen: resting comfortably, no acute distress HEENT: no scleral icterus, pupils equal round and reactive, no palptable cervical adenopathy,  CV Resp: Clear to auscultation bilaterally GI: abdomen is soft, non-tender, non-distended, normal bowel sounds, no hepatosplenomegaly MSK: extremities are warm, no edema.  Skin: warm, no rash Neuro:  no focal deficits Psych: appropriate affect   Diagnostic Studies 04/2012 Carotid US:  Findings:  RIGHT CAROTID ARTERY: Very minor  intimal thickening. No hemodynamically significant ICA stenosis, velocity elevation, or turbulent flow.  RIGHT VERTEBRAL ARTERY: Antegrade  LEFT CAROTID ARTERY: Very minor intimal thickening. No significant plaque. No hemodynamically significant left ICA stenosis, velocity elevation, or turbulent flow.  LEFT VERTEBRAL ARTERY: Antegrade  IMPRESSION: No hemodynamically significant stenosis by ultrasound     Assessment and Plan        Antoine Poche, M.D., F.A.C.C.

## 2013-05-29 ENCOUNTER — Ambulatory Visit: Payer: Medicare Other | Admitting: Pulmonary Disease

## 2013-06-03 ENCOUNTER — Encounter (INDEPENDENT_AMBULATORY_CARE_PROVIDER_SITE_OTHER): Payer: Self-pay | Admitting: Surgery

## 2013-06-03 ENCOUNTER — Other Ambulatory Visit: Payer: Self-pay | Admitting: Family Medicine

## 2013-06-05 MED ORDER — ALPRAZOLAM 0.5 MG PO TABS: ORAL_TABLET | ORAL | Status: DC

## 2013-06-05 NOTE — Telephone Encounter (Signed)
RX called in .

## 2013-06-05 NOTE — Telephone Encounter (Signed)
Last refill 9/19  #30.  Last OV 8/22  OK refill?

## 2013-06-05 NOTE — Telephone Encounter (Signed)
ok 

## 2013-06-08 ENCOUNTER — Ambulatory Visit: Payer: Medicare Other | Admitting: *Deleted

## 2013-06-08 ENCOUNTER — Encounter: Payer: Medicare Other | Admitting: *Deleted

## 2013-06-08 VITALS — Ht 61.0 in | Wt 240.9 lb

## 2013-06-08 DIAGNOSIS — E669 Obesity, unspecified: Secondary | ICD-10-CM

## 2013-06-15 ENCOUNTER — Other Ambulatory Visit: Payer: Self-pay

## 2013-06-16 ENCOUNTER — Other Ambulatory Visit (INDEPENDENT_AMBULATORY_CARE_PROVIDER_SITE_OTHER): Payer: Self-pay

## 2013-06-26 ENCOUNTER — Ambulatory Visit: Payer: Medicare Other | Admitting: Pulmonary Disease

## 2013-06-29 ENCOUNTER — Encounter: Payer: Self-pay | Admitting: Family Medicine

## 2013-06-29 ENCOUNTER — Other Ambulatory Visit: Payer: Self-pay | Admitting: Family Medicine

## 2013-06-29 ENCOUNTER — Encounter (INDEPENDENT_AMBULATORY_CARE_PROVIDER_SITE_OTHER): Payer: Self-pay | Admitting: Surgery

## 2013-06-30 ENCOUNTER — Other Ambulatory Visit: Payer: Self-pay | Admitting: Family Medicine

## 2013-06-30 MED ORDER — ALPRAZOLAM 0.5 MG PO TABS: ORAL_TABLET | ORAL | Status: DC

## 2013-06-30 NOTE — Telephone Encounter (Signed)
.  Rx Refilled early ok'd by [facility]

## 2013-07-05 ENCOUNTER — Ambulatory Visit: Payer: Medicare Other | Admitting: Dietician

## 2013-07-05 NOTE — Progress Notes (Signed)
  Supervised Weight Loss Visit:   Pre-Operative Gastric By-Pass Surgery  Medical Nutrition Therapy:  Appt start time: 1700 end time:  1730.  Primary concerns today: Supervised Weight Loss Visit.  Weight: 240.9 lb  BMI: 45.6  24-hr recall: Patient states she eats mostly food that she cooks at home, limits eating out. She is limiting her soda intake. She has tried [employer] but prefers the [employer] which she has 1/day. She is also increasing her water intake  Medications: see list  Recent physical activity:  She walks her dog in the neighborhood as her primary activity  Progress Towards Goal(s):  In progress.  Handouts given during visit include:  None at this visit   Nutritional Diagnosis:  [place]-1.5 Excessive energy intake As related to activity level.  As evidenced by BMI of 45.6.    Intervention:  Nutrition counseling offered based on current eating habits..  Monitoring/Evaluation:  Dietary intake, exercise, and body weight. Follow up in 1 month for next supervised weight loss visit.

## 2013-07-10 ENCOUNTER — Encounter: Payer: Self-pay | Admitting: *Deleted

## 2013-07-10 ENCOUNTER — Encounter: Payer: Medicare Other | Attending: Surgery | Admitting: *Deleted

## 2013-07-10 VITALS — Ht 61.0 in | Wt 248.1 lb

## 2013-07-10 DIAGNOSIS — E669 Obesity, unspecified: Secondary | ICD-10-CM | POA: Insufficient documentation

## 2013-07-10 DIAGNOSIS — Z713 Dietary counseling and surveillance: Secondary | ICD-10-CM | POA: Insufficient documentation

## 2013-07-10 NOTE — Progress Notes (Signed)
Supervised Weight Loss Visit: Pre-Operative Gastric By-Pass Surgery  Medical Nutrition Therapy:  Appt start time: 0830 end time:  0900.  Primary concerns today:  Supervised Weight Loss. Patient drinking 1 Atkins Shake daily. She doesn't like these very much. We discussed finding other high protein, low carb options that she likes. She has started taking a 1 a Day multivitamin. She is exercising and monitoring portion size. She is still drinking about 16-24 ounces of soda per day.   Weight: 248.1 pounds BMI: 47  24-hr recall:  B ( AM): 2 boiled eggs, toast, sausage  Snk ( AM): None  L ( PM): Sandwich, fruit Snk ( PM): Atkins shake D ( PM): Baked chicken, corn Snk ( PM): None  Medications: See list  Recent physical activity: Gym 3 days per week, 45 minutes, treadmill, elliptical, bicycle  Progress Towards Goal(s):  In progress.   Nutritional Diagnosis:  [place]-3.3 Overweight/obesity related to past poor dietary habits and physical inactivity as evidenced by patient with planned LAGB surgery and BMI of 47    Intervention:  Nutrition counseling. Stressed the importance of adding snacks to her day. We also discussed other protein shake options and reducing soda intake.   Monitoring/Evaluation:  Dietary intake, exercise, and body weight in 1 month(s) for next supervised weight loss visit.

## 2013-07-11 ENCOUNTER — Ambulatory Visit: Payer: Medicare Other | Admitting: Pulmonary Disease

## 2013-07-11 ENCOUNTER — Ambulatory Visit: Payer: Medicare Other | Admitting: Dietician

## 2013-07-20 ENCOUNTER — Ambulatory Visit (INDEPENDENT_AMBULATORY_CARE_PROVIDER_SITE_OTHER): Payer: Medicare Other | Admitting: Psychiatry

## 2013-07-31 ENCOUNTER — Encounter: Payer: Self-pay | Admitting: Family Medicine

## 2013-07-31 ENCOUNTER — Other Ambulatory Visit: Payer: Self-pay | Admitting: Family Medicine

## 2013-07-31 MED ORDER — ALPRAZOLAM 0.5 MG PO TABS: ORAL_TABLET | ORAL | Status: DC

## 2013-07-31 NOTE — Telephone Encounter (Signed)
Rx Refilled  

## 2013-08-01 ENCOUNTER — Ambulatory Visit (INDEPENDENT_AMBULATORY_CARE_PROVIDER_SITE_OTHER): Payer: Medicare Other | Admitting: Psychiatry

## 2013-08-08 ENCOUNTER — Encounter: Payer: Medicare Other | Admitting: Dietician

## 2013-08-08 VITALS — Ht 61.0 in | Wt 254.9 lb

## 2013-08-08 DIAGNOSIS — E669 Obesity, unspecified: Secondary | ICD-10-CM

## 2013-08-08 NOTE — Patient Instructions (Addendum)
Continue working on [employer] such as not drinking during meal times and chewing thoroughly. Continue exercising 3-4 x week for 60 minutes. Continue tracking protein and carbs and watching portion sizes. Work on getting 64 oz of water per day.

## 2013-08-08 NOTE — Progress Notes (Signed)
Supervised Weight Loss Visit: Pre-Operative Gastric By-Pass Surgery  Medical Nutrition Therapy:  Appt start time: 0815 end time:  0830.  Primary concerns today:  Supervised Weight Loss. Harper returns today with a 6 lb weight gain. States she went out of town and has been going to a lot of holiday functions. Still going to the gym 3 x week and now going for 60 minutes. Found a protein shake that she likes [employer]. Stopped drinking soda and caffeine, sweet drinks, and carbonation. Having 1 square of dark chocolate per day. Starting having snacks. Tracking what she is eating (protein and carbs) in a notebook.    Wt Readings from Last 3 Encounters:  08/08/13 254 lb 14.4 oz (115.622 kg)  07/10/13 248 lb 1.6 oz (112.537 kg)  06/08/13 240 lb 14.4 oz (109.272 kg)   Ht Readings from Last 3 Encounters:  08/08/13 5\' 1"  (1.549 m)  07/10/13 5\' 1"  (1.549 m)  06/08/13 5\' 1"  (1.549 m)   Body mass index is 48.19 kg/(m^2). @BMIFA @ Normalized weight-for-age data available only for age 71 to 20 years. Normalized stature-for-age data available only for age 71 to 20 years.   24-hr recall:  B ( AM): 2 boiled eggs, toast, sausage  Snk ( AM): banana  L ( PM): Sandwich, fruit Snk ( PM): protein shake or peanut butter crackers  D ( PM): Baked chicken, corn Snk ( PM): None  Medications: See list  Recent physical activity: Gym 3 days per week, 60 minutes, treadmill, elliptical, bicycle  Progress Towards Goal(s):  In progress.   Nutritional Diagnosis:  [place]-3.3 Overweight/obesity related to past poor dietary habits and physical inactivity as evidenced by patient with planned LAGB surgery and BMI of 47    Intervention:  Nutrition counseling provided.  Plan: Continue working on Pre-Op goals such as not drinking during meal times and chewing thoroughly. Continue exercising 3-4 x week for 60 minutes. Continue tracking protein and carbs and watching portion sizes. Work on getting 64 oz of water per day.    Monitoring/Evaluation:  Dietary intake, exercise, and body weight in 1 month(s) for next supervised weight loss visit.

## 2013-08-13 ENCOUNTER — Other Ambulatory Visit: Payer: Self-pay | Admitting: Family Medicine

## 2013-08-13 ENCOUNTER — Encounter: Payer: Self-pay | Admitting: Family Medicine

## 2013-08-14 ENCOUNTER — Other Ambulatory Visit: Payer: Self-pay | Admitting: *Deleted

## 2013-08-14 DIAGNOSIS — F411 Generalized anxiety disorder: Secondary | ICD-10-CM

## 2013-08-14 MED ORDER — BUPROPION HCL ER (XL) 150 MG PO TB24: 150.0000 mg | ORAL_TABLET | Freq: Every day | ORAL | Status: DC

## 2013-08-14 NOTE — Telephone Encounter (Signed)
Med refilled.

## 2013-08-22 ENCOUNTER — Ambulatory Visit: Payer: Medicare Other | Admitting: Pulmonary Disease

## 2013-08-23 ENCOUNTER — Ambulatory Visit (INDEPENDENT_AMBULATORY_CARE_PROVIDER_SITE_OTHER): Payer: Medicare Other | Admitting: Surgery

## 2013-08-24 ENCOUNTER — Encounter (INDEPENDENT_AMBULATORY_CARE_PROVIDER_SITE_OTHER): Payer: Self-pay | Admitting: Surgery

## 2013-08-24 ENCOUNTER — Other Ambulatory Visit (INDEPENDENT_AMBULATORY_CARE_PROVIDER_SITE_OTHER): Payer: Self-pay

## 2013-08-24 ENCOUNTER — Ambulatory Visit (INDEPENDENT_AMBULATORY_CARE_PROVIDER_SITE_OTHER): Payer: Medicare Other | Admitting: Surgery

## 2013-08-24 DIAGNOSIS — R739 Hyperglycemia, unspecified: Secondary | ICD-10-CM

## 2013-08-24 DIAGNOSIS — I10 Essential (primary) hypertension: Secondary | ICD-10-CM

## 2013-08-24 DIAGNOSIS — E559 Vitamin D deficiency, unspecified: Secondary | ICD-10-CM

## 2013-08-24 DIAGNOSIS — K219 Gastro-esophageal reflux disease without esophagitis: Secondary | ICD-10-CM

## 2013-08-24 DIAGNOSIS — K2100 Gastro-esophageal reflux disease with esophagitis, without bleeding: Secondary | ICD-10-CM

## 2013-08-24 DIAGNOSIS — K2210 Ulcer of esophagus without bleeding: Secondary | ICD-10-CM

## 2013-08-24 DIAGNOSIS — Z6841 Body Mass Index (BMI) 40.0 and over, adult: Secondary | ICD-10-CM

## 2013-08-24 LAB — CBC WITH DIFFERENTIAL/PLATELET
BASOS ABS: 0 10*3/uL (ref 0.0–0.1)
BASOS PCT: 0 % (ref 0–1)
EOS PCT: 7 % — AB (ref 0–5)
Eosinophils Absolute: 0.3 10*3/uL (ref 0.0–0.7)
HCT: 38.2 % (ref 36.0–46.0)
Hemoglobin: 12.2 g/dL (ref 12.0–15.0)
LYMPHS PCT: 47 % — AB (ref 12–46)
Lymphs Abs: 2.2 10*3/uL (ref 0.7–4.0)
MCH: 27.8 pg (ref 26.0–34.0)
MCHC: 31.9 g/dL (ref 30.0–36.0)
MCV: 87 fL (ref 78.0–100.0)
Monocytes Absolute: 0.6 10*3/uL (ref 0.1–1.0)
Monocytes Relative: 12 % (ref 3–12)
Neutro Abs: 1.6 10*3/uL — ABNORMAL LOW (ref 1.7–7.7)
Neutrophils Relative %: 34 % — ABNORMAL LOW (ref 43–77)
PLATELETS: 319 10*3/uL (ref 150–400)
RBC: 4.39 MIL/uL (ref 3.87–5.11)
RDW: 13.9 % (ref 11.5–15.5)
WBC: 4.8 10*3/uL (ref 4.0–10.5)

## 2013-08-24 LAB — LIPID PANEL
CHOL/HDL RATIO: 3.9 ratio
CHOLESTEROL: 194 mg/dL (ref 0–200)
HDL: 50 mg/dL (ref >39)
LDL CALC: 99 mg/dL (ref 0–99)
TRIGLYCERIDES: 226 mg/dL — AB (ref <150)
VLDL: 45 mg/dL — AB (ref 0–40)

## 2013-08-24 LAB — COMPREHENSIVE METABOLIC PANEL
ALK PHOS: 89 U/L (ref 39–117)
ALT: 49 U/L — ABNORMAL HIGH (ref 0–35)
AST: 61 U/L — AB (ref 0–37)
Albumin: 3.8 g/dL (ref 3.5–5.2)
BILIRUBIN TOTAL: 0.2 mg/dL — AB (ref 0.3–1.2)
BUN: 7 mg/dL (ref 6–23)
CO2: 26 mEq/L (ref 19–32)
CREATININE: 0.68 mg/dL (ref 0.50–1.10)
Calcium: 9.4 mg/dL (ref 8.4–10.5)
Chloride: 101 mEq/L (ref 96–112)
Glucose, Bld: 87 mg/dL (ref 70–99)
Potassium: 4.6 mEq/L (ref 3.5–5.3)
Sodium: 138 mEq/L (ref 135–145)
Total Protein: 7.5 g/dL (ref 6.0–8.3)

## 2013-08-24 LAB — T4: T4, Total: 6.4 ug/dL (ref 5.0–12.5)

## 2013-08-24 LAB — TSH: TSH: 1.623 u[IU]/mL (ref 0.350–4.500)

## 2013-08-24 NOTE — Patient Instructions (Signed)

## 2013-08-24 NOTE — Progress Notes (Signed)
Chief Complaint:  Morbid obesity BMI 48 with gastroesophageal reflux  History of Present Illness:  Sara [name] is an 33 y.o. female who returns today having completed everything for her Roux-en-Y gastric bypass. She is undergone a 6 months at supervised weight loss. She has had an upper GI series which shows a small sliding hiatal hernia with reflux. She has started bariatric procedures in detail and into her seminar and is decided to have Roux-en-Y gastric bypass. Initially her interest were in the band but after further investing she is opted for the bypass.  I went over this with her again and she is comfortable moving ahead with scheduling. I would consider giving her on the schedule for Roux-en-Y gastric bypass.  Past Medical History  Diagnosis Date  . Hyperlipidemia   . Obesity   . Obstructive sleep apnea     Insomnia; utilizes no therapy  . Hypertension   . GERD (gastroesophageal reflux disease)   . Vitamin D deficiency   . Transaminase or LDH elevation 04/24/2009  . Chronic back pain   . 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 Fasting hyperglycemia 05/12/2012  . Heart murmur, systolic   . Stroke     2008  . Erosive esophagitis   . Neuromuscular disorder   . Depression   . Anxiety     Past Surgical History  Procedure Laterality Date  . Foot surgery  05/2009    Dr Ramos(Left ankle)  . Esophageal manometry N/A 11/07/2012    Procedure: ESOPHAGEAL MANOMETRY (EM);  Surgeon: Mardella Laymanavid R Patterson, MD;  Location: [facility];  Service: Endoscopy;  Laterality: N/A;    Current Outpatient Prescriptions  Medication Sig Dispense Refill  . ALPRAZolam (XANAX) 0.5 MG tablet TAKE ONE TABLET BY MOUTH AT BEDTIME AS NEEDED  30 tablet  2  . atorvastatin (LIPITOR) 80 MG tablet Take 1 tablet (80 mg total) by mouth daily.  30 tablet  11  . buPROPion (WELLBUTRIN XL) 150 MG 24 hr tablet TAKE ONE TABLET BY MOUTH ONCE DAILY  30 tablet   1  . CYMBALTA 60 MG capsule Take 60 mg by mouth daily.       Marland Kitchen. dexlansoprazole (DEXILANT) 60 MG capsule Take 1 capsule (60 mg total) by mouth daily.  30 capsule  6  . gabapentin (NEURONTIN) 300 MG capsule Take 300 mg by mouth 3 (three) times daily.       Marland Kitchen. lisinopril (PRINIVIL,ZESTRIL) 20 MG tablet Take 20 mg by mouth daily.      Marland Kitchen. oxyCODONE-acetaminophen (PERCOCET) 10-325 MG per tablet 1 tablet. Prn foot pain      . traZODone (DESYREL) 50 MG tablet        No current facility-administered medications for this visit.   Review of patient's allergies indicates no known allergies. Family History  Problem Relation Age of Onset  . Diabetes Father   . Hypertension Father   . Multiple sclerosis Brother   . Asthma Other   . Breast cancer Mother   . Heart attack Mother   . Lung cancer Father   . Colon cancer Neg Hx    Social History:   reports that she has never smoked. She has never used smokeless tobacco. She reports that she does not drink alcohol or use illicit drugs.   REVIEW OF SYSTEMS - PERTINENT POSITIVES ONLY: No prior abdominal surgery. One prior vaginal delivery. No history of DVT problems with that.  Physical Exam:  Blood pressure 124/78, pulse 84, temperature 97.7 F (36.5 C), temperature source Oral, resp. rate 16, height 5\' 1"  (1.549 m), weight 257 lb 9.6 oz (116.847 kg). Body mass index is 48.7 kg/(m^2).  Gen:  WDWN white female NAD  Neurological: Alert and oriented to person, place, and time. Motor and sensory function is grossly intact  Head: Normocephalic and atraumatic.  Eyes: Conjunctivae are normal. Pupils are equal, round, and reactive to light. No scleral icterus.  Neck: Normal range of motion. Neck supple. No tracheal deviation or thyromegaly present.  Cardiovascular:  SR without murmurs or gallops.  No carotid bruits Respiratory: Effort normal.  No respiratory distress. No chest wall tenderness. Breath sounds normal.  No wheezes, rales or rhonchi.  Abdomen:   nontender no prior surgery. GU: Musculoskeletal: Normal range of motion. Extremities are nontender. No cyanosis, edema or clubbing noted Lymphadenopathy: No cervical, preauricular, postauricular or axillary adenopathy is present Skin: Skin is warm and dry. No rash noted. No diaphoresis. No erythema. No pallor. Pscyh: Normal mood and affect. Behavior is normal. Judgment and thought content normal.   LABORATORY RESULTS: No results found for this or any previous visit (from the past 48 hour(s)).  RADIOLOGY RESULTS: No results found.  Problem List: Patient Active Problem List   Diagnosis Date Noted  . Anxiety   . Obesity, unspecified 01/12/2013  . Internal hemorrhoids 10/28/2012  . Rectal bleeding 10/28/2012  . Anal fissure 10/28/2012  . Fasting hyperglycemia 05/12/2012  . Heart murmur, systolic   . Hyperlipidemia   . Obesity   . Obstructive sleep apnea   . Hypertension   . 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 We'll move toward scheduling her for a laparoscopic Roux-en-Y gastric bypass.    Matt B. Daphine Deutscher, MD, [facility] [phone] beeper [phone]  08/24/2013 12:15 PM

## 2013-09-08 ENCOUNTER — Ambulatory Visit: Payer: Medicare Other | Admitting: Dietician

## 2013-09-14 ENCOUNTER — Encounter: Payer: Medicare Other | Attending: Surgery

## 2013-09-14 VITALS — Ht 61.0 in | Wt 254.0 lb

## 2013-09-14 DIAGNOSIS — E669 Obesity, unspecified: Secondary | ICD-10-CM | POA: Insufficient documentation

## 2013-09-14 DIAGNOSIS — Z713 Dietary counseling and surveillance: Secondary | ICD-10-CM | POA: Insufficient documentation

## 2013-09-15 NOTE — Patient Instructions (Signed)
Follow:   Pre-Op Diet per MD 2 weeks prior to surgery  Phase 2- Liquids (clear/full) 2 weeks after surgery  Vitamin/Mineral/Calcium guidelines for purchasing bariatric supplements  Exercise guidelines pre and post-op per MD  Follow-up at [facility] in 2 weeks post-op for diet advancement. Contact Sara Himmelrich as needed with questions/concerns. 

## 2013-09-15 NOTE — Progress Notes (Addendum)
Pre-Operative Nutrition Class:  Appt start time: 1730   End time:  1830.  Patient was seen on 09/14/2013 for Pre-Operative Bariatric Surgery Education at [facility].   Surgery date: 10/03/2013 Surgery type: RYGB Start weight at [facility]: 246 lbs on 03/13/13 Weight today: 254.0  TANITA  BODY COMP RESULTS  09/15/13   BMI (kg/m^2) 48.0   Fat Mass (lbs) 133.0   Fat Free Mass (lbs) 121.0   Total Body Water (lbs) 88.5   Samples given per MNT protocol. Patient educated on appropriate usage: BariActiv Multivitamin Lot # [id number] Exp: 5/16  BariActiv Calcium Citrate Lot # [id number] Exp: 6/16  BariActiv Iron Lot # [id number] Exp: 5/16  Premier Vanilla Protein Powder Lot # [id number] Exp: 05/10/2014  The following the learning objectives were met by the patient during this course:  Identify Pre-Op Dietary Goals and will begin 2 weeks pre-operatively  Identify appropriate sources of fluids and proteins   State protein recommendations and appropriate sources pre and post-operatively  Identify Post-Operative Dietary Goals and will follow for 2 weeks post-operatively  Identify appropriate multivitamin and calcium sources  Describe the need for physical activity post-operatively and will follow MD recommendations  State when to call healthcare provider regarding medication questions or post-operative complications  Handouts given during class include:  Pre-Op Bariatric Surgery Diet Handout  Protein Shake Handout  Post-Op Bariatric Surgery Nutrition Handout  BELT Program Information Flyer  Support Group Information Flyer  [facility] Bariatric Supplements Price List  Follow-Up Plan: Patient will follow-up at [facility] 2 weeks post operatively for diet advancement per MD.  

## 2013-09-20 ENCOUNTER — Emergency Department (HOSPITAL_COMMUNITY)
Admission: EM | Admit: 2013-09-20 | Discharge: 2013-09-20 | Disposition: A | Discharge status: Home / Self Care | Payer: Medicare Other | Attending: Emergency Medicine | Admitting: Emergency Medicine

## 2013-09-20 ENCOUNTER — Encounter (HOSPITAL_COMMUNITY): Payer: Self-pay | Admitting: Emergency Medicine

## 2013-09-20 DIAGNOSIS — R011 Cardiac murmur, unspecified: Secondary | ICD-10-CM | POA: Insufficient documentation

## 2013-09-20 DIAGNOSIS — Z862 Personal history of diseases of the blood and blood-forming organs and certain disorders involving the immune mechanism: Secondary | ICD-10-CM | POA: Insufficient documentation

## 2013-09-20 DIAGNOSIS — E669 Obesity, unspecified: Secondary | ICD-10-CM | POA: Insufficient documentation

## 2013-09-20 DIAGNOSIS — K219 Gastro-esophageal reflux disease without esophagitis: Secondary | ICD-10-CM | POA: Insufficient documentation

## 2013-09-20 DIAGNOSIS — Z8639 Personal history of other endocrine, nutritional and metabolic disease: Secondary | ICD-10-CM | POA: Insufficient documentation

## 2013-09-20 DIAGNOSIS — L259 Unspecified contact dermatitis, unspecified cause: Secondary | ICD-10-CM

## 2013-09-20 DIAGNOSIS — G8929 Other chronic pain: Secondary | ICD-10-CM | POA: Insufficient documentation

## 2013-09-20 DIAGNOSIS — Z8669 Personal history of other diseases of the nervous system and sense organs: Secondary | ICD-10-CM | POA: Insufficient documentation

## 2013-09-20 DIAGNOSIS — I10 Essential (primary) hypertension: Secondary | ICD-10-CM | POA: Insufficient documentation

## 2013-09-20 DIAGNOSIS — F411 Generalized anxiety disorder: Secondary | ICD-10-CM | POA: Insufficient documentation

## 2013-09-20 DIAGNOSIS — F329 Major depressive disorder, single episode, unspecified: Secondary | ICD-10-CM | POA: Insufficient documentation

## 2013-09-20 DIAGNOSIS — G4700 Insomnia, unspecified: Secondary | ICD-10-CM | POA: Insufficient documentation

## 2013-09-20 DIAGNOSIS — Z79899 Other long term (current) drug therapy: Secondary | ICD-10-CM | POA: Insufficient documentation

## 2013-09-20 DIAGNOSIS — Z8673 Personal history of transient ischemic attack (TIA), and cerebral infarction without residual deficits: Secondary | ICD-10-CM | POA: Insufficient documentation

## 2013-09-20 DIAGNOSIS — F3289 Other specified depressive episodes: Secondary | ICD-10-CM | POA: Insufficient documentation

## 2013-09-20 MED ORDER — PREDNISONE 50 MG PO TABS
60.0000 mg | ORAL_TABLET | Freq: Once | ORAL | Status: AC
  Administered 2013-09-20: 60 mg via ORAL
  Filled 2013-09-20 (×2): qty 1

## 2013-09-20 MED ORDER — PREDNISONE 10 MG PO TABS: ORAL_TABLET | ORAL | Status: DC

## 2013-09-20 MED ORDER — FAMOTIDINE 40 MG PO TABS: 40.0000 mg | ORAL_TABLET | Freq: Two times a day (BID) | ORAL | Status: DC

## 2013-09-20 MED ORDER — HYDROXYZINE HCL 25 MG PO TABS
50.0000 mg | ORAL_TABLET | Freq: Once | ORAL | Status: AC
  Administered 2013-09-20: 50 mg via ORAL
  Filled 2013-09-20: qty 2

## 2013-09-20 MED ORDER — FAMOTIDINE 20 MG PO TABS
40.0000 mg | ORAL_TABLET | Freq: Once | ORAL | Status: AC
  Administered 2013-09-20: 40 mg via ORAL
  Filled 2013-09-20: qty 2

## 2013-09-20 MED ORDER — HYDROXYZINE HCL 25 MG PO TABS: 25.0000 mg | ORAL_TABLET | Freq: Four times a day (QID) | ORAL | Status: DC | PRN

## 2013-09-20 NOTE — ED Notes (Signed)
Pt reports have tried some lotion at the tanning studio today & has been burning & itching since, pt states has taken 3 benadryl today.

## 2013-09-20 NOTE — Discharge Instructions (Signed)

## 2013-09-22 ENCOUNTER — Ambulatory Visit: Payer: Medicare Other | Admitting: Family Medicine

## 2013-09-22 ENCOUNTER — Ambulatory Visit: Payer: Medicare Other | Admitting: Pulmonary Disease

## 2013-09-22 ENCOUNTER — Encounter (HOSPITAL_COMMUNITY): Payer: Self-pay | Admitting: Pharmacy Technician

## 2013-09-23 NOTE — ED Provider Notes (Signed)
Medical screening examination/treatment/procedure(s) were performed by non-physician practitioner and as supervising physician I was immediately available for consultation/collaboration.  EKG Interpretation   None        Juliet RudeNathan R. Rubin PayorPickering, MD 09/23/13 1527

## 2013-09-23 NOTE — ED Provider Notes (Signed)
CSN: [id number]     Arrival date & time 09/20/13  2125 History   First MD Initiated Contact with Patient 09/20/13 2253     Chief Complaint  Patient presents with  . Rash     (Consider location/radiation/quality/duration/timing/severity/associated sxs/prior Treatment) HPI Comments: Sara [name] is a 33 y.o. Female presenting with a pruritic rash which started today shortly after applying a new tanning lotion when she visited her tanning salon.  She reports severe all over itching not relieved by the benadryl tablets (3) she took at home this afternoon.  She denies fevers or chills, also without shortness of breath, cough, wheezing, facial or mouth swelling.      The history is provided by the patient.    Past Medical History  Diagnosis Date  . Hyperlipidemia   . Obesity   . Obstructive sleep apnea     Insomnia; utilizes no therapy  . Hypertension   . GERD (gastroesophageal reflux disease)   . Vitamin D deficiency   . Transaminase or LDH elevation 04/24/2009  . Chronic back pain   . 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 Fasting hyperglycemia 05/12/2012  . Heart murmur, systolic   . Stroke     2008  . Erosive esophagitis   . Neuromuscular disorder   . Depression   . Anxiety    Past Surgical History  Procedure Laterality Date  . Foot surgery  05/2009    Dr Ramos(Left ankle)  . Esophageal manometry N/A 11/07/2012    Procedure: ESOPHAGEAL MANOMETRY (EM);  Surgeon: Mardella Laymanavid R Patterson, MD;  Location: [facility];  Service: Endoscopy;  Laterality: N/A;   Family History  Problem Relation Age of Onset  . Diabetes Father   . Hypertension Father   . Multiple sclerosis Brother   . Asthma Other   . Breast cancer Mother   . Heart attack Mother   . Lung cancer Father   . Colon cancer Neg Hx    History  Substance Use Topics  . Smoking status: Never Smoker   . Smokeless tobacco: Never Used  . Alcohol Use: No    OB History   Grav Para Term Preterm Abortions TAB SAB Ect Mult Living                 Review of Systems  Constitutional: Negative for fever and chills.  HENT: Negative for facial swelling.   Respiratory: Negative for shortness of breath and wheezing.   Skin: Positive for rash.  Neurological: Negative for numbness.      Allergies  Review of patient's allergies indicates no known allergies.  Home Medications   Current Outpatient Rx  Name  Route  Sig  Dispense  Refill  . ALPRAZolam (XANAX) 0.5 MG tablet   Oral   Take 0.5 mg by mouth at bedtime.         Marland Kitchen. buPROPion (WELLBUTRIN XL) 150 MG 24 hr tablet   Oral   Take 150 mg by mouth every morning.         . CYMBALTA 60 MG capsule   Oral   Take 60 mg by mouth every evening.          Marland Kitchen. dexlansoprazole (DEXILANT) 60 MG capsule   Oral   Take 60 mg by mouth daily.         Marland Kitchen. gabapentin (NEURONTIN) 300 MG capsule   Oral   Take 300 mg by mouth 3 (three) times daily.          .Marland Kitchen  oxyCODONE-acetaminophen (PERCOCET) 10-325 MG per tablet   Oral   Take 1 tablet by mouth every 6 (six) hours as needed for pain. Prn foot pain         . traZODone (DESYREL) 50 MG tablet   Oral   Take 50 mg by mouth at bedtime.           BP 121/52  Pulse 85  Temp(Src) 97.9 F (36.6 C) (Oral)  Resp 20  Ht 5\' 1"  (1.549 m)  Wt 254 lb (115.214 kg)  BMI 48.02 kg/m2  SpO2 100%  LMP 09/02/2013 Physical Exam  Constitutional: She appears well-developed and well-nourished. No distress.  HENT:  Head: Normocephalic.  Neck: Neck supple.  Cardiovascular: Normal rate.   Pulmonary/Chest: Effort normal. She has no wheezes.  Musculoskeletal: Normal range of motion. She exhibits no edema.  Skin: Rash noted. There is erythema.  Generalized erythema with sharply demarcated line across neck consistent with 1st degree sunburn.  There is a diffuse raised sandpaper quality rash on arms, legs,  Trunk with excoriations, no drainage, pustules or  vesicles.    ED Course  Procedures (including critical care time) Labs Review Labs Reviewed - No data to display Imaging Review No results found.  EKG Interpretation   None       MDM   Final diagnoses:  Contact dermatitis    Pt prescribed prednisone taper, atarax in place of benadryl,  Pepcid,  First doses given in ed.  Advised prn f/u, avoid this skin product,  Consider dc tanning bed exposure.  Prn f/u with pcp if not improving.    Burgess Amor, PA-C 09/23/13 [phone]

## 2013-09-26 ENCOUNTER — Inpatient Hospital Stay (HOSPITAL_COMMUNITY): Admission: RE | Admit: 2013-09-26 | Payer: Medicare Other | Source: Ambulatory Visit

## 2013-09-26 ENCOUNTER — Ambulatory Visit: Payer: Medicare Other | Admitting: Family Medicine

## 2013-09-28 ENCOUNTER — Ambulatory Visit (INDEPENDENT_AMBULATORY_CARE_PROVIDER_SITE_OTHER): Payer: Medicare Other | Admitting: Surgery

## 2013-09-28 ENCOUNTER — Encounter (HOSPITAL_COMMUNITY): Payer: Self-pay

## 2013-09-28 ENCOUNTER — Encounter (INDEPENDENT_AMBULATORY_CARE_PROVIDER_SITE_OTHER): Payer: Self-pay | Admitting: Surgery

## 2013-09-28 ENCOUNTER — Encounter (HOSPITAL_COMMUNITY)
Admission: RE | Admit: 2013-09-28 | Discharge: 2013-09-28 | Disposition: A | Discharge status: Home / Self Care | Payer: Medicare Other | Source: Ambulatory Visit | Attending: Surgery | Admitting: Surgery

## 2013-09-28 DIAGNOSIS — Z01812 Encounter for preprocedural laboratory examination: Secondary | ICD-10-CM | POA: Insufficient documentation

## 2013-09-28 LAB — COMPREHENSIVE METABOLIC PANEL
ALT: 25 U/L (ref 0–35)
AST: 30 U/L (ref 0–37)
Albumin: 3.8 g/dL (ref 3.5–5.2)
Alkaline Phosphatase: 98 U/L (ref 39–117)
BUN: 8 mg/dL (ref 6–23)
CO2: 22 meq/L (ref 19–32)
Calcium: 9.7 mg/dL (ref 8.4–10.5)
Chloride: 98 mEq/L (ref 96–112)
Creatinine, Ser: 0.76 mg/dL (ref 0.50–1.10)
GFR calc non Af Amer: >90 mL/min (ref >90)
GLUCOSE: 117 mg/dL — AB (ref 70–99)
Potassium: 4.4 mEq/L (ref 3.7–5.3)
SODIUM: 136 meq/L — AB (ref 137–147)
Total Bilirubin: 0.3 mg/dL (ref 0.3–1.2)
Total Protein: 8.3 g/dL (ref 6.0–8.3)

## 2013-09-28 LAB — CBC WITH DIFFERENTIAL/PLATELET
Basophils Absolute: 0 10*3/uL (ref 0.0–0.1)
Basophils Relative: 0 % (ref 0–1)
EOS PCT: 2 % (ref 0–5)
Eosinophils Absolute: 0.2 10*3/uL (ref 0.0–0.7)
HEMATOCRIT: 38.7 % (ref 36.0–46.0)
Hemoglobin: 12.7 g/dL (ref 12.0–15.0)
LYMPHS ABS: 3.3 10*3/uL (ref 0.7–4.0)
LYMPHS PCT: 31 % (ref 12–46)
MCH: 28.6 pg (ref 26.0–34.0)
MCHC: 32.8 g/dL (ref 30.0–36.0)
MCV: 87.2 fL (ref 78.0–100.0)
MONO ABS: 0.6 10*3/uL (ref 0.1–1.0)
MONOS PCT: 6 % (ref 3–12)
Neutro Abs: 6.3 10*3/uL (ref 1.7–7.7)
Neutrophils Relative %: 61 % (ref 43–77)
PLATELETS: 365 10*3/uL (ref 150–400)
RBC: 4.44 MIL/uL (ref 3.87–5.11)
RDW: 13.3 % (ref 11.5–15.5)
WBC: 10.4 10*3/uL (ref 4.0–10.5)

## 2013-09-28 NOTE — Progress Notes (Signed)
Chest x ray 6/14, EKG 9/14, sleep study with OV Dr Craige CottaSood 8/14 [facility]

## 2013-09-28 NOTE — Progress Notes (Signed)
Chief Complaint:  Morbid obesity BMI 48 with gastroesophageal reflux  History of Present Illness:  Sara [name] is an 33 y.o. female who returns today having completed everything for her Roux-en-Y gastric bypass. She is undergone a 6 months at supervised weight loss. She has had an upper GI series which shows a small sliding hiatal hernia with reflux. She has started bariatric procedures in detail and into her seminar and is decided to have Roux-en-Y gastric bypass. Initially her interest were in the band but after further investing she is opted for the bypass.  I went over this with her again and she is comfortable moving ahead with scheduling. I would consider giving her on the schedule for Roux-en-Y gastric bypass.  Past Medical History  Diagnosis Date  . Hyperlipidemia   . Obesity   . Hypertension   . GERD (gastroesophageal reflux disease)   . Vitamin D deficiency   . Transaminase or LDH elevation 04/24/2009  . Chronic back pain   . 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 Fasting hyperglycemia 05/12/2012  . Heart murmur, systolic   . Erosive esophagitis   . Neuromuscular disorder   . Depression   . Anxiety   . Stroke     2008/ no deficits  . Obstructive sleep apnea     CPAP- sleep study 8/14 EPIC    Past Surgical History  Procedure Laterality Date  . Foot surgery  05/2009    Dr Ramos(Left ankle)  . Esophageal manometry N/A 11/07/2012    Procedure: ESOPHAGEAL MANOMETRY (EM);  Surgeon: Sable Feil, MD;  Location: [facility];  Service: Endoscopy;  Laterality: N/A;    Current Outpatient Prescriptions  Medication Sig Dispense Refill  . ALPRAZolam (XANAX) 0.5 MG tablet Take 0.5 mg by mouth at bedtime.      Marland Kitchen buPROPion (WELLBUTRIN XL) 150 MG 24 hr tablet Take 150 mg by mouth every morning.      . CYMBALTA 60 MG capsule Take 60 mg by mouth every evening.       Marland Kitchen dexlansoprazole (DEXILANT) 60 MG capsule Take 60  mg by mouth daily.      Marland Kitchen gabapentin (NEURONTIN) 300 MG capsule Take 300 mg by mouth 3 (three) times daily.       . Multiple Vitamins-Minerals (QC WOMENS DAILY MULTIVITAMIN PO) Take 1 tablet by mouth daily.      Marland Kitchen oxyCODONE-acetaminophen (PERCOCET) 10-325 MG per tablet Take 1 tablet by mouth every 6 (six) hours as needed for pain. Prn foot pain      . traZODone (DESYREL) 50 MG tablet Take 50 mg by mouth at bedtime.        No current facility-administered medications for this visit.   Review of patient's allergies indicates no known allergies. Family History  Problem Relation Age of Onset  . Diabetes Father   . Hypertension Father   . Multiple sclerosis Brother   . Asthma Other   . Breast cancer Mother   . Heart attack Mother   . Lung cancer Father   . Colon cancer Neg Hx    Social History:   reports that she has never smoked. She has never used smokeless tobacco. She reports that she does not drink alcohol or use illicit drugs.   REVIEW OF SYSTEMS - PERTINENT POSITIVES ONLY: No prior abdominal surgery. One prior vaginal delivery. No history of DVT problems with that.  Physical Exam:   Height _0  (1.549  m), weight 251 lb 6.4 oz (114.034 kg), last menstrual period 09/02/2013. Body mass index is 47.53 kg/(m^2).  Gen:  WDWN white female NAD  Neurological: Alert and oriented to person, place, and time. Motor and sensory function is grossly intact  Head: Normocephalic and atraumatic.  Eyes: Conjunctivae are normal. Pupils are equal, round, and reactive to light. No scleral icterus.  Neck: Normal range of motion. Neck supple. No tracheal deviation or thyromegaly present.  Cardiovascular:  SR without murmurs or gallops.  No carotid bruits Respiratory: Effort normal.  No respiratory distress. No chest wall tenderness. Breath sounds normal.  No wheezes, rales or rhonchi.  Abdomen:  nontender no prior surgery. GU: Musculoskeletal: Normal range of motion. Extremities are nontender. No  cyanosis, edema or clubbing noted Lymphadenopathy: No cervical, preauricular, postauricular or axillary adenopathy is present Skin: Skin is warm and dry. No rash noted. No diaphoresis. No erythema. No pallor. Pscyh: Normal mood and affect. Behavior is normal. Judgment and thought content normal.   LABORATORY RESULTS: Results for orders placed during the hospital encounter of 09/28/13 (from the past 48 hour(s))  CBC WITH DIFFERENTIAL     Status: None   Collection Time    09/28/13  8:30 AM      Result Value Ref Range   WBC 10.4  4.0 - 10.5 K/uL   RBC 4.44  3.87 - 5.11 MIL/uL   Hemoglobin 12.7  12.0 - 15.0 g/dL   HCT 38.7  36.0 - 46.0 %   MCV 87.2  78.0 - 100.0 fL   MCH 28.6  26.0 - 34.0 pg   MCHC 32.8  30.0 - 36.0 g/dL   RDW 13.3  11.5 - 15.5 %   Platelets 365  150 - 400 K/uL   Neutrophils Relative % 61  43 - 77 %   Neutro Abs 6.3  1.7 - 7.7 K/uL   Lymphocytes Relative 31  12 - 46 %   Lymphs Abs 3.3  0.7 - 4.0 K/uL   Monocytes Relative 6  3 - 12 %   Monocytes Absolute 0.6  0.1 - 1.0 K/uL   Eosinophils Relative 2  0 - 5 %   Eosinophils Absolute 0.2  0.0 - 0.7 K/uL   Basophils Relative 0  0 - 1 %   Basophils Absolute 0.0  0.0 - 0.1 K/uL  COMPREHENSIVE METABOLIC PANEL     Status: Abnormal   Collection Time    09/28/13  8:30 AM      Result Value Ref Range   Sodium 136 (*) 137 - 147 mEq/L   Potassium 4.4  3.7 - 5.3 mEq/L   Chloride 98  96 - 112 mEq/L   CO2 22  19 - 32 mEq/L   Glucose, Bld 117 (*) 70 - 99 mg/dL   BUN 8  6 - 23 mg/dL   Creatinine, Ser 0.76  0.50 - 1.10 mg/dL   Calcium 9.7  8.4 - 10.5 mg/dL   Total Protein 8.3  6.0 - 8.3 g/dL   Albumin 3.8  3.5 - 5.2 g/dL   AST 30  0 - 37 U/L   ALT 25  0 - 35 U/L   Alkaline Phosphatase 98  39 - 117 U/L   Total Bilirubin 0.3  0.3 - 1.2 mg/dL   GFR calc non Af Amer >90  >90 mL/min   GFR calc Af Amer >90  >90 mL/min   Comment: (NOTE)     The eGFR has been calculated using the CKD EPI  equation.     This calculation has not been  validated in all clinical situations.     eGFR's persistently <90 mL/min signify possible Chronic Kidney     Disease.    RADIOLOGY RESULTS: No results found.  Problem List: Patient Active Problem List   Diagnosis Date Noted  . Morbid obesity 08/24/2013  . Anxiety   . Obesity, unspecified 01/12/2013  . Internal hemorrhoids 10/28/2012  . Rectal bleeding 10/28/2012  . Anal fissure 10/28/2012  . Fasting hyperglycemia 05/12/2012  . Heart murmur, systolic   . Hyperlipidemia   . Obesity   . Obstructive sleep apnea   . Hypertension   . 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 We'll move toward scheduling her for a laparoscopic Roux-en-Y gastric bypass. UGI shows a small hiatus hernia.  Questions answered.      Matt B. Hassell Done, MD, [facility] [phone] beeper [phone]  09/28/2013 11:19 AM

## 2013-09-28 NOTE — Patient Instructions (Addendum)
      Your procedure is scheduled on:  10/03/13 TUESDAY  Report to [facility] at 0515      AM.  Call this number if you have problems the morning of surgery: [phone]     BRING CPAP MASK AND TUBING WITH YOU TO HOSPITAL   Do not eat food  Or drink :After Midnight. Monday NIGHT   Take these medicines the morning of surgery with A SIP OF WATER: BUPROPION, DEXILANT, GABAPENTIN MAY TAKE OXYCODONE   .  Contacts, dentures or partial plates, or metal hairpins  can not be worn to surgery. Your family will be responsible for glasses, dentures, hearing aides while you are in surgery  Leave suitcase in the car. After surgery it may be brought to your room.  For patients admitted to the hospital, checkout time is 11:00 AM day of  discharge.                DO NOT WEAR JEWELRY, LOTIONS, POWDERS, OR PERFUMES.  WOMEN-- DO NOT SHAVE LEGS OR UNDERARMS FOR 48 HOURS BEFORE SHOWERS. MEN MAY SHAVE FACE.  Patients discharged the day of surgery will not be allowed to drive home. IF going home the day of surgery, you must have a driver and someone to stay with you for the first 24 hours  Name and phone number of your driver:   ADMISSION                                                                                          FAILURE TO FOLLOW THESE INSTRUCTIONS MAY RESULT IN  CANCELLATION   OF YOUR SURGERY                                                  Patient Signature _____________________________

## 2013-09-28 NOTE — Patient Instructions (Signed)

## 2013-10-03 ENCOUNTER — Encounter (HOSPITAL_COMMUNITY)
Admission: RE | Disposition: A | Discharge status: Home / Self Care | Payer: Self-pay | Source: Ambulatory Visit | Attending: Surgery

## 2013-10-03 ENCOUNTER — Encounter (HOSPITAL_COMMUNITY): Payer: Medicare Other | Admitting: Anesthesiology

## 2013-10-03 ENCOUNTER — Inpatient Hospital Stay (HOSPITAL_COMMUNITY)
Admission: RE | Admit: 2013-10-03 | Discharge: 2013-10-05 | DRG: 621 | Disposition: A | Discharge status: Home / Self Care | Payer: Medicare Other | Source: Ambulatory Visit | Attending: Surgery | Admitting: Surgery

## 2013-10-03 ENCOUNTER — Encounter (HOSPITAL_COMMUNITY): Payer: Self-pay | Admitting: *Deleted

## 2013-10-03 ENCOUNTER — Inpatient Hospital Stay (HOSPITAL_COMMUNITY): Payer: Medicare Other | Admitting: Anesthesiology

## 2013-10-03 DIAGNOSIS — I10 Essential (primary) hypertension: Secondary | ICD-10-CM

## 2013-10-03 DIAGNOSIS — M549 Dorsalgia, unspecified: Secondary | ICD-10-CM | POA: Diagnosis present

## 2013-10-03 DIAGNOSIS — Z6841 Body Mass Index (BMI) 40.0 and over, adult: Secondary | ICD-10-CM

## 2013-10-03 DIAGNOSIS — G8929 Other chronic pain: Secondary | ICD-10-CM | POA: Diagnosis present

## 2013-10-03 DIAGNOSIS — F329 Major depressive disorder, single episode, unspecified: Secondary | ICD-10-CM | POA: Diagnosis present

## 2013-10-03 DIAGNOSIS — F411 Generalized anxiety disorder: Secondary | ICD-10-CM | POA: Diagnosis present

## 2013-10-03 DIAGNOSIS — Z803 Family history of malignant neoplasm of breast: Secondary | ICD-10-CM

## 2013-10-03 DIAGNOSIS — Z01812 Encounter for preprocedural laboratory examination: Secondary | ICD-10-CM

## 2013-10-03 DIAGNOSIS — K219 Gastro-esophageal reflux disease without esophagitis: Secondary | ICD-10-CM | POA: Diagnosis present

## 2013-10-03 DIAGNOSIS — Z8249 Family history of ischemic heart disease and other diseases of the circulatory system: Secondary | ICD-10-CM

## 2013-10-03 DIAGNOSIS — G4733 Obstructive sleep apnea (adult) (pediatric): Secondary | ICD-10-CM | POA: Diagnosis present

## 2013-10-03 DIAGNOSIS — K449 Diaphragmatic hernia without obstruction or gangrene: Secondary | ICD-10-CM | POA: Diagnosis present

## 2013-10-03 DIAGNOSIS — Z9884 Bariatric surgery status: Secondary | ICD-10-CM

## 2013-10-03 DIAGNOSIS — Z833 Family history of diabetes mellitus: Secondary | ICD-10-CM

## 2013-10-03 DIAGNOSIS — E785 Hyperlipidemia, unspecified: Secondary | ICD-10-CM | POA: Diagnosis present

## 2013-10-03 DIAGNOSIS — F3289 Other specified depressive episodes: Secondary | ICD-10-CM | POA: Diagnosis present

## 2013-10-03 DIAGNOSIS — K2100 Gastro-esophageal reflux disease with esophagitis, without bleeding: Secondary | ICD-10-CM

## 2013-10-03 DIAGNOSIS — Z8673 Personal history of transient ischemic attack (TIA), and cerebral infarction without residual deficits: Secondary | ICD-10-CM

## 2013-10-03 DIAGNOSIS — Z801 Family history of malignant neoplasm of trachea, bronchus and lung: Secondary | ICD-10-CM

## 2013-10-03 HISTORY — PX: GASTRIC ROUX-EN-Y: SHX5262

## 2013-10-03 LAB — GLUCOSE, CAPILLARY: Glucose-Capillary: 117 mg/dL — ABNORMAL HIGH (ref 70–99)

## 2013-10-03 LAB — CBC
HEMATOCRIT: 34.9 % — AB (ref 36.0–46.0)
Hemoglobin: 11.5 g/dL — ABNORMAL LOW (ref 12.0–15.0)
MCH: 28.8 pg (ref 26.0–34.0)
MCHC: 33 g/dL (ref 30.0–36.0)
MCV: 87.5 fL (ref 78.0–100.0)
Platelets: 323 10*3/uL (ref 150–400)
RBC: 3.99 MIL/uL (ref 3.87–5.11)
RDW: 13.1 % (ref 11.5–15.5)
WBC: 18 10*3/uL — AB (ref 4.0–10.5)

## 2013-10-03 LAB — CREATININE, SERUM
Creatinine, Ser: 0.69 mg/dL (ref 0.50–1.10)
GFR calc non Af Amer: >90 mL/min (ref >90)

## 2013-10-03 LAB — PREGNANCY, URINE: Preg Test, Ur: NEGATIVE

## 2013-10-03 SURGERY — LAPAROSCOPIC ROUX-EN-Y GASTRIC
Anesthesia: General | Site: Abdomen

## 2013-10-03 MED ORDER — MIDAZOLAM HCL 2 MG/2ML IJ SOLN
INTRAMUSCULAR | Status: AC
  Filled 2013-10-03: qty 2

## 2013-10-03 MED ORDER — HEPARIN SODIUM (PORCINE) 5000 UNIT/ML IJ SOLN
5000.0000 [IU] | Freq: Three times a day (TID) | INTRAMUSCULAR | Status: DC
  Administered 2013-10-03 – 2013-10-05 (×5): 5000 [IU] via SUBCUTANEOUS
  Filled 2013-10-03 (×8): qty 1

## 2013-10-03 MED ORDER — MORPHINE SULFATE 2 MG/ML IJ SOLN
2.0000 mg | INTRAMUSCULAR | Status: DC | PRN
  Administered 2013-10-03 (×2): 2 mg via INTRAVENOUS
  Administered 2013-10-04: 4 mg via INTRAVENOUS
  Administered 2013-10-04 (×2): 2 mg via INTRAVENOUS
  Filled 2013-10-03: qty 1
  Filled 2013-10-03: qty 2
  Filled 2013-10-03 (×3): qty 1

## 2013-10-03 MED ORDER — GLYCOPYRROLATE 0.2 MG/ML IJ SOLN
INTRAMUSCULAR | Status: DC | PRN
  Administered 2013-10-03: 0.6 mg via INTRAVENOUS

## 2013-10-03 MED ORDER — CEFOXITIN SODIUM 2 G IV SOLR
2.0000 g | INTRAVENOUS | Status: AC
  Administered 2013-10-03 (×2): 2 g via INTRAVENOUS
  Filled 2013-10-03: qty 2

## 2013-10-03 MED ORDER — PROPOFOL 10 MG/ML IV BOLUS
INTRAVENOUS | Status: AC
  Filled 2013-10-03: qty 20

## 2013-10-03 MED ORDER — UNJURY VANILLA POWDER
2.0000 [oz_av] | Freq: Four times a day (QID) | ORAL | Status: DC
  Administered 2013-10-05: 2 [oz_av] via ORAL

## 2013-10-03 MED ORDER — HYDROMORPHONE HCL PF 2 MG/ML IJ SOLN
INTRAMUSCULAR | Status: AC
  Filled 2013-10-03: qty 1

## 2013-10-03 MED ORDER — KCL IN DEXTROSE-NACL 20-5-0.45 MEQ/L-%-% IV SOLN
INTRAVENOUS | Status: DC
  Administered 2013-10-03: 100 mL via INTRAVENOUS
  Administered 2013-10-03 – 2013-10-04 (×2): via INTRAVENOUS
  Administered 2013-10-04 – 2013-10-05 (×2): 100 mL via INTRAVENOUS
  Filled 2013-10-03 (×7): qty 1000

## 2013-10-03 MED ORDER — LACTATED RINGERS IR SOLN
Status: DC | PRN
  Administered 2013-10-03: 1

## 2013-10-03 MED ORDER — PROPOFOL 10 MG/ML IV BOLUS
INTRAVENOUS | Status: DC | PRN
  Administered 2013-10-03: 200 mg via INTRAVENOUS

## 2013-10-03 MED ORDER — UNJURY CHICKEN SOUP POWDER: 2.0000 [oz_av] | Freq: Four times a day (QID) | ORAL | Status: DC

## 2013-10-03 MED ORDER — 0.9 % SODIUM CHLORIDE (POUR BTL) OPTIME
TOPICAL | Status: DC | PRN
  Administered 2013-10-03: 1000 mL

## 2013-10-03 MED ORDER — DEXAMETHASONE SODIUM PHOSPHATE 10 MG/ML IJ SOLN
INTRAMUSCULAR | Status: AC
  Filled 2013-10-03: qty 1

## 2013-10-03 MED ORDER — CISATRACURIUM BESYLATE 20 MG/10ML IV SOLN
INTRAVENOUS | Status: AC
  Filled 2013-10-03: qty 10

## 2013-10-03 MED ORDER — CISATRACURIUM BESYLATE (PF) 10 MG/5ML IV SOLN
INTRAVENOUS | Status: DC | PRN
  Administered 2013-10-03: 4 mg via INTRAVENOUS
  Administered 2013-10-03: 6 mg via INTRAVENOUS
  Administered 2013-10-03: 4 mg via INTRAVENOUS
  Administered 2013-10-03: 6 mg via INTRAVENOUS
  Administered 2013-10-03: 2 mg via INTRAVENOUS

## 2013-10-03 MED ORDER — BUPIVACAINE LIPOSOME 1.3 % IJ SUSP
20.0000 mL | Freq: Once | INTRAMUSCULAR | Status: DC
  Filled 2013-10-03: qty 20

## 2013-10-03 MED ORDER — ACETAMINOPHEN 160 MG/5ML PO SOLN: 325.0000 mg | ORAL | Status: DC | PRN

## 2013-10-03 MED ORDER — METOCLOPRAMIDE HCL 5 MG/ML IJ SOLN
INTRAMUSCULAR | Status: AC
  Filled 2013-10-03: qty 2

## 2013-10-03 MED ORDER — ACETAMINOPHEN 160 MG/5ML PO SOLN
650.0000 mg | ORAL | Status: DC | PRN
Start: 2013-10-04 — End: 2013-10-05

## 2013-10-03 MED ORDER — ONDANSETRON HCL 4 MG/2ML IJ SOLN
4.0000 mg | INTRAMUSCULAR | Status: DC | PRN
  Administered 2013-10-04 (×2): 4 mg via INTRAVENOUS
  Filled 2013-10-03 (×2): qty 2

## 2013-10-03 MED ORDER — FENTANYL CITRATE 0.05 MG/ML IJ SOLN
INTRAMUSCULAR | Status: AC
  Filled 2013-10-03: qty 5

## 2013-10-03 MED ORDER — NEOSTIGMINE METHYLSULFATE 1 MG/ML IJ SOLN
INTRAMUSCULAR | Status: DC | PRN
  Administered 2013-10-03: 5 mg via INTRAVENOUS

## 2013-10-03 MED ORDER — MIDAZOLAM HCL 5 MG/5ML IJ SOLN
INTRAMUSCULAR | Status: DC | PRN
  Administered 2013-10-03 (×2): 2 mg via INTRAVENOUS

## 2013-10-03 MED ORDER — OXYCODONE HCL 5 MG/5ML PO SOLN
5.0000 mg | ORAL | Status: DC | PRN
  Administered 2013-10-04: 5 mg via ORAL
  Administered 2013-10-05: 10 mg via ORAL
  Filled 2013-10-03: qty 10
  Filled 2013-10-03: qty 25

## 2013-10-03 MED ORDER — FENTANYL CITRATE 0.05 MG/ML IJ SOLN
INTRAMUSCULAR | Status: DC | PRN
  Administered 2013-10-03: 50 ug via INTRAVENOUS
  Administered 2013-10-03: 100 ug via INTRAVENOUS
  Administered 2013-10-03 (×2): 50 ug via INTRAVENOUS

## 2013-10-03 MED ORDER — HYDROMORPHONE HCL PF 1 MG/ML IJ SOLN: 0.2500 mg | INTRAMUSCULAR | Status: DC | PRN

## 2013-10-03 MED ORDER — ONDANSETRON HCL 4 MG/2ML IJ SOLN
INTRAMUSCULAR | Status: DC | PRN
  Administered 2013-10-03: 4 mg via INTRAVENOUS

## 2013-10-03 MED ORDER — KETAMINE HCL 10 MG/ML IJ SOLN
INTRAMUSCULAR | Status: AC
  Filled 2013-10-03: qty 1

## 2013-10-03 MED ORDER — HYDROMORPHONE HCL PF 1 MG/ML IJ SOLN
INTRAMUSCULAR | Status: DC | PRN
  Administered 2013-10-03: 2 mg via INTRAVENOUS

## 2013-10-03 MED ORDER — KETAMINE HCL 10 MG/ML IJ SOLN
INTRAMUSCULAR | Status: DC | PRN
  Administered 2013-10-03: 100 mg via INTRAVENOUS

## 2013-10-03 MED ORDER — NEOSTIGMINE METHYLSULFATE 1 MG/ML IJ SOLN
INTRAMUSCULAR | Status: AC
  Filled 2013-10-03: qty 10

## 2013-10-03 MED ORDER — FENTANYL CITRATE 0.05 MG/ML IJ SOLN
INTRAMUSCULAR | Status: AC
  Filled 2013-10-03: qty 2

## 2013-10-03 MED ORDER — PROMETHAZINE HCL 25 MG/ML IJ SOLN: 6.2500 mg | INTRAMUSCULAR | Status: DC | PRN

## 2013-10-03 MED ORDER — HEPARIN SODIUM (PORCINE) 5000 UNIT/ML IJ SOLN
5000.0000 [IU] | INTRAMUSCULAR | Status: AC
  Administered 2013-10-03: 5000 [IU] via SUBCUTANEOUS
  Filled 2013-10-03: qty 1

## 2013-10-03 MED ORDER — DEXTROSE 5 % IV SOLN
INTRAVENOUS | Status: AC
  Filled 2013-10-03 (×2): qty 1

## 2013-10-03 MED ORDER — LACTATED RINGERS IV SOLN
INTRAVENOUS | Status: DC | PRN
  Administered 2013-10-03 (×2): via INTRAVENOUS

## 2013-10-03 MED ORDER — UNJURY CHOCOLATE CLASSIC POWDER: 2.0000 [oz_av] | Freq: Four times a day (QID) | ORAL | Status: DC

## 2013-10-03 MED ORDER — ROCURONIUM BROMIDE 100 MG/10ML IV SOLN: INTRAVENOUS | Status: DC | PRN

## 2013-10-03 MED ORDER — BUPIVACAINE LIPOSOME 1.3 % IJ SUSP
INTRAMUSCULAR | Status: DC | PRN
  Administered 2013-10-03: 20 mL

## 2013-10-03 MED ORDER — TISSEEL VH 10 ML EX KIT
PACK | CUTANEOUS | Status: DC | PRN
  Administered 2013-10-03: 10 mL

## 2013-10-03 MED ORDER — GLYCOPYRROLATE 0.2 MG/ML IJ SOLN
INTRAMUSCULAR | Status: AC
  Filled 2013-10-03: qty 3

## 2013-10-03 MED ORDER — DEXAMETHASONE SODIUM PHOSPHATE 4 MG/ML IJ SOLN
INTRAMUSCULAR | Status: DC | PRN
  Administered 2013-10-03: 10 mg via INTRAVENOUS

## 2013-10-03 MED ORDER — METOCLOPRAMIDE HCL 5 MG/ML IJ SOLN
INTRAMUSCULAR | Status: DC | PRN
  Administered 2013-10-03: 10 mg via INTRAVENOUS

## 2013-10-03 MED ORDER — ONDANSETRON HCL 4 MG/2ML IJ SOLN
INTRAMUSCULAR | Status: AC
  Filled 2013-10-03: qty 2

## 2013-10-03 MED ORDER — SUCCINYLCHOLINE CHLORIDE 20 MG/ML IJ SOLN
INTRAMUSCULAR | Status: DC | PRN
  Administered 2013-10-03: 100 mg via INTRAVENOUS

## 2013-10-03 MED ORDER — TISSEEL VH 10 ML EX KIT
PACK | CUTANEOUS | Status: AC
  Filled 2013-10-03: qty 1

## 2013-10-03 SURGICAL SUPPLY — 73 items
APL SKNCLS STERI-STRIP NONHPOA (GAUZE/BANDAGES/DRESSINGS)
APL SRG 32X5 SNPLK LF DISP (MISCELLANEOUS) ×1
APPLICATOR COTTON TIP 6IN STRL (MISCELLANEOUS) ×6 IMPLANT
APPLIER CLIP ROT 13.4 12 LRG (CLIP) ×3
APR CLP LRG 13.4X12 ROT 20 MLT (CLIP) ×1
BENZOIN TINCTURE PRP APPL 2/3 (GAUZE/BANDAGES/DRESSINGS) IMPLANT
BLADE SURG 15 STRL LF DISP TIS (BLADE) ×1 IMPLANT
BLADE SURG 15 STRL SS (BLADE) ×3
CABLE HIGH FREQUENCY MONO STRZ (ELECTRODE) ×2 IMPLANT
CANISTER SUCTION 2500CC (MISCELLANEOUS) ×3 IMPLANT
CLIP APPLIE ROT 13.4 12 LRG (CLIP) IMPLANT
CLIP SUT LAPRA TY ABSORB (SUTURE) ×7 IMPLANT
CLOSURE WOUND 1/2 X4 (GAUZE/BANDAGES/DRESSINGS)
DEVICE SUTURE ENDOST 10MM (ENDOMECHANICALS) ×3 IMPLANT
DISSECTOR BLUNT TIP ENDO 5MM (MISCELLANEOUS) IMPLANT
DRAIN PENROSE 18X1/4 LTX STRL (WOUND CARE) ×3 IMPLANT
DRAPE CAMERA CLOSED 9X96 (DRAPES) ×3 IMPLANT
GAUZE SPONGE 4X4 16PLY XRAY LF (GAUZE/BANDAGES/DRESSINGS) ×3 IMPLANT
GEL PDS (MISCELLANEOUS) ×2 IMPLANT
GLOVE BIOGEL M 8.0 STRL (GLOVE) ×3 IMPLANT
GOWN STRL REUS W/TWL XL LVL3 (GOWN DISPOSABLE) ×13 IMPLANT
HANDLE STAPLE EGIA 4 XL (STAPLE) ×3 IMPLANT
HOVERMATT SINGLE USE (MISCELLANEOUS) ×3 IMPLANT
KIT BASIN OR (CUSTOM PROCEDURE TRAY) ×3 IMPLANT
KIT GASTRIC LAVAGE 34FR ADT (SET/KITS/TRAYS/PACK) ×3 IMPLANT
MARKER SKIN DUAL TIP RULER LAB (MISCELLANEOUS) ×3 IMPLANT
NDL SPNL 22GX3.5 QUINCKE BK (NEEDLE) ×1 IMPLANT
NEEDLE SPNL 22GX3.5 QUINCKE BK (NEEDLE) ×3 IMPLANT
NS IRRIG 1000ML POUR BTL (IV SOLUTION) ×3 IMPLANT
PACK CARDIOVASCULAR III (CUSTOM PROCEDURE TRAY) ×3 IMPLANT
RELOAD EGIA 45 MED/THCK PURPLE (STAPLE) ×5 IMPLANT
RELOAD EGIA 45 TAN VASC (STAPLE) ×3 IMPLANT
RELOAD EGIA 60 MED/THCK PURPLE (STAPLE) ×9 IMPLANT
RELOAD EGIA 60 TAN VASC (STAPLE) ×3 IMPLANT
RELOAD ENDO STITCH 2.0 (ENDOMECHANICALS) ×18
RELOAD STAPLE 60 MED/THCK ART (STAPLE) ×2 IMPLANT
RELOAD SUT SNGL STCH ABSRB 2-0 (ENDOMECHANICALS) ×5 IMPLANT
RELOAD SUT SNGL STCH BLK 2-0 (ENDOMECHANICALS) ×4 IMPLANT
SCISSORS LAP 5X45 EPIX DISP (ENDOMECHANICALS) ×3 IMPLANT
SEALANT SURGICAL APPL DUAL CAN (MISCELLANEOUS) ×3 IMPLANT
SET IRRIG TUBING LAPAROSCOPIC (IRRIGATION / IRRIGATOR) ×3 IMPLANT
SHEARS CURVED HARMONIC AC 45CM (MISCELLANEOUS) ×3 IMPLANT
SLEEVE ADV FIXATION 12X100MM (TROCAR) ×8 IMPLANT
SLEEVE ADV FIXATION 5X100MM (TROCAR) IMPLANT
SOLUTION ANTI FOG 6CC (MISCELLANEOUS) ×5 IMPLANT
SPONGE GAUZE 4X4 12PLY (GAUZE/BANDAGES/DRESSINGS) ×1 IMPLANT
STAPLER VISISTAT 35W (STAPLE) ×3 IMPLANT
STRIP CLOSURE SKIN 1/2X4 (GAUZE/BANDAGES/DRESSINGS) IMPLANT
STRIP PERI DRY VERITAS 45 (STAPLE) ×2 IMPLANT
STRIP PERI DRY VERITAS 60 (STAPLE) ×3 IMPLANT
SUT DVC SILK 2.0X39 (SUTURE) ×10 IMPLANT
SUT DVC VICRYL PGA 2.0X39 (SUTURE) ×4 IMPLANT
SUT RELOAD ENDO STITCH 2 48X1 (ENDOMECHANICALS) ×3
SUT RELOAD ENDO STITCH 2.0 (ENDOMECHANICALS) ×3
SUT VIC AB 2-0 SH 27 (SUTURE) ×3
SUT VIC AB 2-0 SH 27X BRD (SUTURE) ×1 IMPLANT
SUT VIC AB 4-0 SH 18 (SUTURE) ×3 IMPLANT
SUTURE RELOAD END STTCH 2 48X1 (ENDOMECHANICALS) ×3 IMPLANT
SUTURE RELOAD ENDO STITCH 2.0 (ENDOMECHANICALS) ×3 IMPLANT
SYR 20CC LL (SYRINGE) ×3 IMPLANT
SYR 30ML LL (SYRINGE) ×3 IMPLANT
SYR 50ML LL SCALE MARK (SYRINGE) ×3 IMPLANT
TOWEL OR 17X26 10 PK STRL BLUE (TOWEL DISPOSABLE) ×6 IMPLANT
TOWEL OR NON WOVEN STRL DISP B (DISPOSABLE) ×3 IMPLANT
TRAY FOLEY CATH 14FRSI W/METER (CATHETERS) ×3 IMPLANT
TROCAR ADV FIXATION 12X100MM (TROCAR) ×3 IMPLANT
TROCAR ADV FIXATION 5X100MM (TROCAR) ×3 IMPLANT
TROCAR BLADELESS OPT 5 100 (ENDOMECHANICALS) ×3 IMPLANT
TROCAR XCEL 12X100 BLDLESS (ENDOMECHANICALS) ×3 IMPLANT
TUBING CONNECTING 10 (TUBING) ×2 IMPLANT
TUBING CONNECTING 10' (TUBING) ×1
TUBING ENDO SMARTCAP PENTAX (MISCELLANEOUS) ×3 IMPLANT
TUBING FILTER THERMOFLATOR (ELECTROSURGICAL) ×3 IMPLANT

## 2013-10-03 NOTE — Op Note (Signed)
Sara RoysDebra C [name] [id number] [birth year] 10/03/2013  Preoperative diagnosis: morbid obesity  Postoperative diagnosis: Same   Procedure: Upper endoscopy   Surgeon: Mary SellaEric M. Annamary Buschman M.D., FACS   Anesthesia: Gen.   Indications for procedure: 33 yo female undergoing a laparoscopic roux en y gastric bypass and an upper endoscopy was requested to evaluate the anastomosis.  Description of procedure: After we have completed the new gastrojejunostomy, I scrubbed out and obtained the Olympus endoscope. I gently placed endoscope in the patient's oropharynx and gently glided it down the esophagus without any difficulty under direct visualization. Once I was in the gastric pouch, I insufflated the pouch was air. The pouch was approximately 6cm in size. I was able to cannulate and advanced the scope through the gastrojejunostomy. Dr.[name] had placed saline in the upper abdomen. Upon further insufflation of the gastric pouch there was no evidence of bubbles. Upon further inspection of the gastric pouch, the mucosa appeared normal. There is no evidence of any mucosal abnormality. The gastric pouch and Roux limb were decompressed. The width of the gastrojejunal anastomosis was at least 2.5 cm. The scope was withdrawn. The patient tolerated this portion of the procedure well. Please see Dr Ermalene SearingMartin's operative note for details regarding the laparoscopic roux-en-y gastric bypass.  Mary SellaEric M. Andrey CampanileWilson, MD, FACS General, Bariatric, & Minimally Invasive Surgery [facility] [place]

## 2013-10-03 NOTE — Progress Notes (Signed)
Utilization review completed.  

## 2013-10-03 NOTE — Addendum Note (Signed)
Addendum created 10/03/13 1237 by Lattie Hawachel Catherine Payne Jlynn Langille, CRNA   Modules edited: Anesthesia LDA

## 2013-10-03 NOTE — Op Note (Signed)
Surgeon: Pollyann SavoyMatt B. Daphine DeutscherMartin, MD, FACS Asst:  Gaynelle AduEric Wilson, MD, FACS Anesthesia: General endotracheal Drains: None  Procedure: Laparoscopic Roux en Y gastric bypass with 40 cm BP limb and 100 cm Roux limb, antecolic, antegastric, candy cane to the left.  Closure of Peterson's defect. Upper endoscopy.   Description of Procedure:  [facility] and given general anesthesia.  The abdomen was prepped with PCMX and draped sterilely.  A time out was performed.    The operation began by identifying the ligament of Treitz. I measured 40 cm downstream and divided the bowel with a 6 cm Covidian stapler.  I sutured a Penrose drain along the Roux limb end.  I measured a 1 meter (100 cm) Roux limb and then placed the distal bowels to the BP limb side by side and performed a stapled jejunojejunostomy (4.5 cm staple). The common defect was closed from either end with 4-0 Vicryl using the Endo Stitch. The mesenteric defect was closed with a running 2-0 silk using the Endo 360. Tisseel was applied to the suture line.  The omentum was divided with the harmonic scalpel.  The Nathanson retractor was inserted in the left lateral segment of liver was retracted. The foregut dissection ensued.  5 cm pouch was measured.  No dimple or evidence of hiatus hernia was seen.  4 applications of the 6 cm purple load, 2 without and the rest with peristrips we used to fashion the pouch.    The Roux limb was then brought up with the candycane pointed left and a back row of sutures of 2-0 Vicryl were placed. I opened along the right side of each structure and inserted the 4.5 cm stapler to create the gastrojejunostomy. The common defect was closed from either end with 2-0 Vicryl and a second row was placed anterior to that the Ewald tube acting as a stent across the anastomosis. The Penrose drain was removed. Peterson's defect was closed with 2-0 silk.   Endoscopy was performed by Dr. Andrey CampanileWilson.  No bleeding and no bubbles  were seen.   The incisions were injected with Exparel and were closed with 4-0 Vicryl and Dermabond.  The patient was taken to the recovery room in satisfactory condition.  Matt B. Daphine DeutscherMartin, MD, FACS

## 2013-10-03 NOTE — Care Management Note (Signed)
    Page 1 of 1   10/03/2013     3:40:38 PM   CARE MANAGEMENT NOTE 10/03/2013  Patient:  Sara [name],Sara [name]   Account Number:  [id number]  Date Initiated:  10/03/2013  Documentation initiated by:  Lorenda IshiharaPEELE,Robin Pafford  Subjective/Objective Assessment:   33 yo female admitted s/p gastric bypass. PTA lived at home with mother and fiance.     Action/Plan:   Home when stable   Anticipated DC Date:  10/05/2013   Anticipated DC Plan:  HOME/SELF CARE      DC Planning Services  CM consult      Choice offered to / List presented to:             Status of service:  Completed, signed off Medicare Important Message given?   (If response is "NO", the following Medicare IM given date fields will be blank) Date Medicare IM given:   Date Additional Medicare IM given:    Discharge Disposition:  HOME/SELF CARE  Per UR Regulation:  Reviewed for med. necessity/level of care/duration of stay  If discussed at Long Length of Stay Meetings, dates discussed:    Comments:

## 2013-10-03 NOTE — Anesthesia Postprocedure Evaluation (Signed)
  Anesthesia Post-op Note  Patient: Sara RoysDebra C [name]  Procedure(s) Performed: Procedure(s) (LRB): LAPAROSCOPIC ROUX-EN-Y GASTRIC (N/A)  Patient Location: PACU  Anesthesia Type: General  Level of Consciousness: awake and alert   Airway and Oxygen Therapy: Patient Spontanous Breathing  Post-op Pain: mild  Post-op Assessment: Post-op Vital signs reviewed, Patient's Cardiovascular Status Stable, Respiratory Function Stable, Patent Airway and No signs of Nausea or vomiting  Last Vitals:  Filed Vitals:   10/03/13 1213  BP: 136/66  Pulse:   Temp:   Resp: 14    Post-op Vital Signs: stable   Complications: No apparent anesthesia complications

## 2013-10-03 NOTE — Transfer of Care (Signed)
Immediate Anesthesia Transfer of Care Note  Patient: Sara RoysDebra C [name]  Procedure(s) Performed: Procedure(s): LAPAROSCOPIC ROUX-EN-Y GASTRIC (N/A)  Patient Location: PACU  Anesthesia Type:General  Level of Consciousness: Patient easily awoken, sedated, comfortable, cooperative, following commands, responds to stimulation.   Airway & Oxygen Therapy: Patient spontaneously breathing, ventilating well, oxygen via simple oxygen mask.  Post-op Assessment: Report given to PACU RN, vital signs reviewed and stable, moving all extremities.   Post vital signs: Reviewed and stable.  Complications: No apparent anesthesia complications

## 2013-10-03 NOTE — Interval H&P Note (Signed)
History and Physical Interval Note:  10/03/2013 7:19 AM  Sara [name]  has presented today for surgery, with the diagnosis of morbid obesity   The various methods of treatment have been discussed with the patient and family. After consideration of risks, benefits and other options for treatment, the patient has consented to  Procedure(s): LAPAROSCOPIC ROUX-EN-Y GASTRIC (N/A) as a surgical intervention .  The patient's history has been reviewed, patient examined, no change in status, stable for surgery.  I have reviewed the patient's chart and labs.  Questions were answered to the patient's satisfaction.     Brittyn Salaz B

## 2013-10-03 NOTE — Preoperative (Signed)
Beta Blockers   Reason not to administer Beta Blockers:Not Applicable, not on home BB 

## 2013-10-03 NOTE — H&P (View-Only) (Signed)
Chief Complaint:  Morbid obesity BMI 48 with gastroesophageal reflux  History of Present Illness:  Sara [name] is an 32 y.o. female who returns today having completed everything for her Roux-en-Y gastric bypass. She is undergone a 6 months at supervised weight loss. She has had an upper GI series which shows a small sliding hiatal hernia with reflux. She has started bariatric procedures in detail and into her seminar and is decided to have Roux-en-Y gastric bypass. Initially her interest were in the band but after further investing she is opted for the bypass.  I went over this with her again and she is comfortable moving ahead with scheduling. I would consider giving her on the schedule for Roux-en-Y gastric bypass.  Past Medical History  Diagnosis Date  . Hyperlipidemia   . Obesity   . Hypertension   . GERD (gastroesophageal reflux disease)   . Vitamin D deficiency   . Transaminase or LDH elevation 04/24/2009  . Chronic back pain   . 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 Fasting hyperglycemia 05/12/2012  . Heart murmur, systolic   . Erosive esophagitis   . Neuromuscular disorder   . Depression   . Anxiety   . Stroke     2008/ no deficits  . Obstructive sleep apnea     CPAP- sleep study 8/14 EPIC    Past Surgical History  Procedure Laterality Date  . Foot surgery  05/2009    Dr Ramos(Left ankle)  . Esophageal manometry N/A 11/07/2012    Procedure: ESOPHAGEAL MANOMETRY (EM);  Surgeon: David R Patterson, MD;  Location: [facility];  Service: Endoscopy;  Laterality: N/A;    Current Outpatient Prescriptions  Medication Sig Dispense Refill  . ALPRAZolam (XANAX) 0.5 MG tablet Take 0.5 mg by mouth at bedtime.      . buPROPion (WELLBUTRIN XL) 150 MG 24 hr tablet Take 150 mg by mouth every morning.      . CYMBALTA 60 MG capsule Take 60 mg by mouth every evening.       . dexlansoprazole (DEXILANT) 60 MG capsule Take 60  mg by mouth daily.      . gabapentin (NEURONTIN) 300 MG capsule Take 300 mg by mouth 3 (three) times daily.       . Multiple Vitamins-Minerals (QC WOMENS DAILY MULTIVITAMIN PO) Take 1 tablet by mouth daily.      . oxyCODONE-acetaminophen (PERCOCET) 10-325 MG per tablet Take 1 tablet by mouth every 6 (six) hours as needed for pain. Prn foot pain      . traZODone (DESYREL) 50 MG tablet Take 50 mg by mouth at bedtime.        No current facility-administered medications for this visit.   Review of patient's allergies indicates no known allergies. Family History  Problem Relation Age of Onset  . Diabetes Father   . Hypertension Father   . Multiple sclerosis Brother   . Asthma Other   . Breast cancer Mother   . Heart attack Mother   . Lung cancer Father   . Colon cancer Neg Hx    Social History:   reports that she has never smoked. She has never used smokeless tobacco. She reports that she does not drink alcohol or use illicit drugs.   REVIEW OF SYSTEMS - PERTINENT POSITIVES ONLY: No prior abdominal surgery. One prior vaginal delivery. No history of DVT problems with that.  Physical Exam:   Height 5' 1" (1.549   m), weight 251 lb 6.4 oz (114.034 kg), last menstrual period 09/02/2013. Body mass index is 47.53 kg/(m^2).  Gen:  WDWN white female NAD  Neurological: Alert and oriented to person, place, and time. Motor and sensory function is grossly intact  Head: Normocephalic and atraumatic.  Eyes: Conjunctivae are normal. Pupils are equal, round, and reactive to light. No scleral icterus.  Neck: Normal range of motion. Neck supple. No tracheal deviation or thyromegaly present.  Cardiovascular:  SR without murmurs or gallops.  No carotid bruits Respiratory: Effort normal.  No respiratory distress. No chest wall tenderness. Breath sounds normal.  No wheezes, rales or rhonchi.  Abdomen:  nontender no prior surgery. GU: Musculoskeletal: Normal range of motion. Extremities are nontender. No  cyanosis, edema or clubbing noted Lymphadenopathy: No cervical, preauricular, postauricular or axillary adenopathy is present Skin: Skin is warm and dry. No rash noted. No diaphoresis. No erythema. No pallor. Pscyh: Normal mood and affect. Behavior is normal. Judgment and thought content normal.   LABORATORY RESULTS: Results for orders placed during the hospital encounter of 09/28/13 (from the past 48 hour(s))  CBC WITH DIFFERENTIAL     Status: None   Collection Time    09/28/13  8:30 AM      Result Value Ref Range   WBC 10.4  4.0 - 10.5 K/uL   RBC 4.44  3.87 - 5.11 MIL/uL   Hemoglobin 12.7  12.0 - 15.0 g/dL   HCT 38.7  36.0 - 46.0 %   MCV 87.2  78.0 - 100.0 fL   MCH 28.6  26.0 - 34.0 pg   MCHC 32.8  30.0 - 36.0 g/dL   RDW 13.3  11.5 - 15.5 %   Platelets 365  150 - 400 K/uL   Neutrophils Relative % 61  43 - 77 %   Neutro Abs 6.3  1.7 - 7.7 K/uL   Lymphocytes Relative 31  12 - 46 %   Lymphs Abs 3.3  0.7 - 4.0 K/uL   Monocytes Relative 6  3 - 12 %   Monocytes Absolute 0.6  0.1 - 1.0 K/uL   Eosinophils Relative 2  0 - 5 %   Eosinophils Absolute 0.2  0.0 - 0.7 K/uL   Basophils Relative 0  0 - 1 %   Basophils Absolute 0.0  0.0 - 0.1 K/uL  COMPREHENSIVE METABOLIC PANEL     Status: Abnormal   Collection Time    09/28/13  8:30 AM      Result Value Ref Range   Sodium 136 (*) 137 - 147 mEq/L   Potassium 4.4  3.7 - 5.3 mEq/L   Chloride 98  96 - 112 mEq/L   CO2 22  19 - 32 mEq/L   Glucose, Bld 117 (*) 70 - 99 mg/dL   BUN 8  6 - 23 mg/dL   Creatinine, Ser 0.76  0.50 - 1.10 mg/dL   Calcium 9.7  8.4 - 10.5 mg/dL   Total Protein 8.3  6.0 - 8.3 g/dL   Albumin 3.8  3.5 - 5.2 g/dL   AST 30  0 - 37 U/L   ALT 25  0 - 35 U/L   Alkaline Phosphatase 98  39 - 117 U/L   Total Bilirubin 0.3  0.3 - 1.2 mg/dL   GFR calc non Af Amer >90  >90 mL/min   GFR calc Af Amer >90  >90 mL/min   Comment: (NOTE)     The eGFR has been calculated using the CKD EPI   equation.     This calculation has not been  validated in all clinical situations.     eGFR's persistently <90 mL/min signify possible Chronic Kidney     Disease.    RADIOLOGY RESULTS: No results found.  Problem List: Patient Active Problem List   Diagnosis Date Noted  . Morbid obesity 08/24/2013  . Anxiety   . Obesity, unspecified 01/12/2013  . Internal hemorrhoids 10/28/2012  . Rectal bleeding 10/28/2012  . Anal fissure 10/28/2012  . Fasting hyperglycemia 05/12/2012  . Heart murmur, systolic   . Hyperlipidemia   . Obesity   . Obstructive sleep apnea   . Hypertension   . 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 We'll move toward scheduling her for a laparoscopic Roux-en-Y gastric bypass. UGI shows a small hiatus hernia.  Questions answered.      Matt B. Bennet Kujawa, MD, FACS  [facility] [phone] beeper [phone]  09/28/2013 11:19 AM     

## 2013-10-03 NOTE — Anesthesia Preprocedure Evaluation (Signed)
Anesthesia Evaluation  Patient identified by MRN, date of birth, ID band Patient awake    Reviewed: Allergy & Precautions, H&P , NPO status , Patient's Chart, lab work & pertinent test results  Airway Mallampati: III TM Distance: <3 FB Neck ROM: Full    Dental no notable dental hx.    Pulmonary neg pulmonary ROS, sleep apnea and Continuous Positive Airway Pressure Ventilation ,  breath sounds clear to auscultation  Pulmonary exam normal       Cardiovascular hypertension, Rhythm:Regular Rate:Normal     Neuro/Psych Bipolar Disorder CVA    GI/Hepatic negative GI ROS, Neg liver ROS,   Endo/Other  Morbid obesity  Renal/GU negative Renal ROS  negative genitourinary   Musculoskeletal negative musculoskeletal ROS (+)   Abdominal (+) + obese,   Peds negative pediatric ROS (+)  Hematology negative hematology ROS (+)   Anesthesia Other Findings   Reproductive/Obstetrics negative OB ROS                           Anesthesia Physical Anesthesia Plan  ASA: III  Anesthesia Plan: General   Post-op Pain Management:    Induction: Intravenous  Airway Management Planned: Oral ETT  Additional Equipment:   Intra-op Plan:   Post-operative Plan: Extubation in OR  Informed Consent: I have reviewed the patients History and Physical, chart, labs and discussed the procedure including the risks, benefits and alternatives for the proposed anesthesia with the patient or authorized representative who has indicated his/her understanding and acceptance.   Dental advisory given  Plan Discussed with: CRNA and Surgeon  Anesthesia Plan Comments:         Anesthesia Quick Evaluation

## 2013-10-04 ENCOUNTER — Encounter (HOSPITAL_COMMUNITY): Payer: Self-pay | Admitting: Surgery

## 2013-10-04 ENCOUNTER — Inpatient Hospital Stay (HOSPITAL_COMMUNITY): Payer: Medicare Other

## 2013-10-04 DIAGNOSIS — Z48812 Encounter for surgical aftercare following surgery on the circulatory system: Secondary | ICD-10-CM

## 2013-10-04 LAB — HEMOGLOBIN AND HEMATOCRIT, BLOOD
HEMATOCRIT: 31.5 % — AB (ref 36.0–46.0)
Hemoglobin: 10 g/dL — ABNORMAL LOW (ref 12.0–15.0)

## 2013-10-04 LAB — CBC WITH DIFFERENTIAL/PLATELET
Basophils Absolute: 0 10*3/uL (ref 0.0–0.1)
Basophils Relative: 0 % (ref 0–1)
Eosinophils Absolute: 0 10*3/uL (ref 0.0–0.7)
Eosinophils Relative: 0 % (ref 0–5)
HCT: 32 % — ABNORMAL LOW (ref 36.0–46.0)
HEMOGLOBIN: 10.2 g/dL — AB (ref 12.0–15.0)
Lymphocytes Relative: 16 % (ref 12–46)
Lymphs Abs: 1.9 10*3/uL (ref 0.7–4.0)
MCH: 28.1 pg (ref 26.0–34.0)
MCHC: 31.9 g/dL (ref 30.0–36.0)
MCV: 88.2 fL (ref 78.0–100.0)
MONOS PCT: 6 % (ref 3–12)
Monocytes Absolute: 0.7 10*3/uL (ref 0.1–1.0)
NEUTROS ABS: 9.2 10*3/uL — AB (ref 1.7–7.7)
NEUTROS PCT: 78 % — AB (ref 43–77)
Platelets: 314 10*3/uL (ref 150–400)
RBC: 3.63 MIL/uL — ABNORMAL LOW (ref 3.87–5.11)
RDW: 13.2 % (ref 11.5–15.5)
WBC: 11.8 10*3/uL — ABNORMAL HIGH (ref 4.0–10.5)

## 2013-10-04 MED ORDER — IOHEXOL 300 MG/ML  SOLN
50.0000 mL | Freq: Once | INTRAMUSCULAR | Status: AC | PRN
  Administered 2013-10-04: 10 mL via INTRAVENOUS

## 2013-10-04 NOTE — Progress Notes (Signed)
[name], female   DOB: 04-Oct-1980, 33 y.o.   MRN: [id number] [facility] Surgery Progress Note:   1 Day Post-Op  Subjective: Mental status is clear.   Objective: Vital signs in last 24 hours: Temp:  [98 F (36.7 C)-98.9 F (37.2 C)] 98.3 F (36.8 C) (02/25 0512) Pulse Rate:  [90-117] 101 (02/25 0512) Resp:  [12-26] 18 (02/25 0512) BP: (108-145)/(48-84) 108/69 mmHg (02/25 0512) SpO2:  [89 %-100 %] 96 % (02/25 0512)  Intake/Output from previous day: 02/24 0701 - 02/25 0700 In: 2248.3 [I.V.:2248.3] Out: 1285 [Urine:1235; Blood:50] Intake/Output this shift:    Physical Exam: Work of breathing is normal.  Minimal pain  Lab Results:  Results for orders placed during the hospital encounter of 10/03/13 (from the past 48 hour(s))  PREGNANCY, URINE     Status: None   Collection Time    10/03/13  6:04 AM      Result Value Ref Range   Preg Test, Ur NEGATIVE  NEGATIVE   Comment:            THE SENSITIVITY OF THIS     METHODOLOGY IS >20 mIU/mL.  GLUCOSE, CAPILLARY     Status: Abnormal   Collection Time    10/03/13  6:30 AM      Result Value Ref Range   Glucose-Capillary 117 (*) 70 - 99 mg/dL   Comment 1 Documented in Chart    CBC     Status: Abnormal   Collection Time    10/03/13  1:35 PM      Result Value Ref Range   WBC 18.0 (*) 4.0 - 10.5 K/uL   RBC 3.99  3.87 - 5.11 MIL/uL   Hemoglobin 11.5 (*) 12.0 - 15.0 g/dL   HCT 34.9 (*) 36.0 - 46.0 %   MCV 87.5  78.0 - 100.0 fL   MCH 28.8  26.0 - 34.0 pg   MCHC 33.0  30.0 - 36.0 g/dL   RDW 13.1  11.5 - 15.5 %   Platelets 323  150 - 400 K/uL  CREATININE, SERUM     Status: None   Collection Time    10/03/13  1:35 PM      Result Value Ref Range   Creatinine, Ser 0.69  0.50 - 1.10 mg/dL   GFR calc non Af Amer >90  >90 mL/min   GFR calc Af Amer >90  >90 mL/min   Comment: (NOTE)     The eGFR has been calculated using the CKD EPI equation.     This calculation has not been validated in all clinical situations.      eGFR's persistently <90 mL/min signify possible Chronic Kidney     Disease.  CBC WITH DIFFERENTIAL     Status: Abnormal   Collection Time    10/04/13  5:30 AM      Result Value Ref Range   WBC 11.8 (*) 4.0 - 10.5 K/uL   RBC 3.63 (*) 3.87 - 5.11 MIL/uL   Hemoglobin 10.2 (*) 12.0 - 15.0 g/dL   HCT 32.0 (*) 36.0 - 46.0 %   MCV 88.2  78.0 - 100.0 fL   MCH 28.1  26.0 - 34.0 pg   MCHC 31.9  30.0 - 36.0 g/dL   RDW 13.2  11.5 - 15.5 %   Platelets 314  150 - 400 K/uL   Neutrophils Relative % 78 (*) 43 - 77 %   Neutro Abs 9.2 (*) 1.7 - 7.7 K/uL   Lymphocytes  Relative 16  12 - 46 %   Lymphs Abs 1.9  0.7 - 4.0 K/uL   Monocytes Relative 6  3 - 12 %   Monocytes Absolute 0.7  0.1 - 1.0 K/uL   Eosinophils Relative 0  0 - 5 %   Eosinophils Absolute 0.0  0.0 - 0.7 K/uL   Basophils Relative 0  0 - 1 %   Basophils Absolute 0.0  0.0 - 0.1 K/uL    Radiology/Results: Dg Ugi W/water Sol Cm  10/04/2013   CLINICAL DATA:  Post gastric bypass surgery.  EXAM: WATER SOLUBLE UPPER GI SERIES  TECHNIQUE: Single-column upper GI series was performed using water soluble contrast.  CONTRAST:  10m OMNIPAQUE IOHEXOL 300 MG/ML  SOLN  COMPARISON:  [place] COMPLETE dated 03/13/2013; DG UGI W/HIGH DENSITY W/KUB dated 01/17/2013  FLUOROSCOPY TIME:  38 seconds  FINDINGS: Contrast flowed freely through the gastroesophageal junction. No hiatal hernia present. Contrast filled a small gastric pouch. Contrast flowed through the gastrojejunostomy without leak or obstruction.  IMPRESSION: No leak obstruction following gastric bypass surgery.   Electronically Signed   By: SSuzy BouchardM.D.   On: 10/04/2013 09:03    Anti-infectives: Anti-infectives   Start     Dose/Rate Route Frequency Ordered Stop   10/03/13 0556  cefOXitin (MEFOXIN) 2 g in dextrose 5 % 50 mL IVPB     2 g 100 mL/hr over 30 Minutes Intravenous On call to O.R. 10/03/13 0556 10/03/13 0950      Assessment/Plan: Problem List: Patient Active Problem List    Diagnosis Date Noted  . Gastric bypass status for obesity 10/03/2013  . Morbid obesity 08/24/2013  . Anxiety   . Obesity, unspecified 01/12/2013  . Internal hemorrhoids 10/28/2012  . Rectal bleeding 10/28/2012  . Anal fissure 10/28/2012  . Fasting hyperglycemia 05/12/2012  . Heart murmur, systolic   . Hyperlipidemia   . Obesity   . Obstructive sleep apnea   . Hypertension   . 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   1 Day Post-Op    LOS: 1 day   Matt B. MHassell Done MD, [facility] [phone] [phone] 10/04/2013 9:26 AM

## 2013-10-04 NOTE — Progress Notes (Signed)
VASCULAR LAB PRELIMINARY  PRELIMINARY  PRELIMINARY  PRELIMINARY  Bilateral lower extremity venous duplex completed.    Preliminary report:  Bilateral:  No evidence of DVT, superficial thrombosis, or Baker's Cyst.    Sara [name], RVT 10/04/2013, 9:56 AM

## 2013-10-05 LAB — CBC WITH DIFFERENTIAL/PLATELET
BASOS ABS: 0 10*3/uL (ref 0.0–0.1)
Basophils Relative: 0 % (ref 0–1)
Eosinophils Absolute: 0 10*3/uL (ref 0.0–0.7)
Eosinophils Relative: 0 % (ref 0–5)
HEMATOCRIT: 32.5 % — AB (ref 36.0–46.0)
Hemoglobin: 10.2 g/dL — ABNORMAL LOW (ref 12.0–15.0)
LYMPHS PCT: 23 % (ref 12–46)
Lymphs Abs: 2.8 10*3/uL (ref 0.7–4.0)
MCH: 27.9 pg (ref 26.0–34.0)
MCHC: 31.4 g/dL (ref 30.0–36.0)
MCV: 89 fL (ref 78.0–100.0)
Monocytes Absolute: 0.9 10*3/uL (ref 0.1–1.0)
Monocytes Relative: 7 % (ref 3–12)
NEUTROS ABS: 8.4 10*3/uL — AB (ref 1.7–7.7)
NEUTROS PCT: 69 % (ref 43–77)
Platelets: 297 10*3/uL (ref 150–400)
RBC: 3.65 MIL/uL — ABNORMAL LOW (ref 3.87–5.11)
RDW: 13.4 % (ref 11.5–15.5)
WBC: 12.1 10*3/uL — AB (ref 4.0–10.5)

## 2013-10-05 NOTE — Progress Notes (Signed)
Patient alert and oriented, pain is controlled. Patient is tolerating fluids, plan to advance to protein shake today. Reviewed gastric Bypass  discharge instructions with patient and patient is able to articulate understanding. Provided information on BELT program, Support Group and [facility] outpatient pharmacy. All questions answered, will continue to monitor.

## 2013-10-05 NOTE — Discharge Instructions (Signed)

## 2013-10-05 NOTE — Progress Notes (Signed)
Assessment unchanged. Pt verbalized understanding of dc instructions through teach back. Skip EstimableLaurie Deaton, RN in to complete teaching recently and provided pt with written script x 1. Pt already had My Chart account. Discharged via wc to front entrance to meet awaiting vehicle to carry home. Accompanied by husband and NT.

## 2013-10-05 NOTE — Discharge Summary (Signed)
Physician Discharge Summary  Patient ID: Sara RoysDebra C [name] MRN: [id number] DOB/AGE: [birth year] 33 y.o.  Admit date: 10/03/2013 Discharge date: 10/05/2013  Admission Diagnoses:  Morbid obesity  Discharge Diagnoses:  same  Active Problems:   Gastric bypass status for obesity   Surgery:  Laparoscopic roux Y gastric bypass  Discharged Condition: improved  Hospital Course:   Had surgery.  UGI ok.  Begun on diet and advanced  Consults: none  Significant Diagnostic Studies: UGI    Discharge Exam: Blood pressure 144/76, pulse 102, temperature 98.1 F (36.7 C), temperature source Oral, resp. rate 18, height 5\' 1"  (1.549 m), weight 256 lb (116.121 kg), last menstrual period 10/03/2013, SpO2 91.00%. Apical pulse 96;  Abdomen soft  Disposition: 01-Home or Self Care  Discharge Orders   Future Appointments Provider Department Dept Phone   10/17/2013 3:30 PM [facility] Post-Op Class [facility] [phone]   10/19/2013 9:00 AM Sara MerinoMatthew [name] Hopie Pellegrin, MD [facility] [place] [phone]   11/01/2013 11:00 AM Antoine PocheJonathan F Branch, MD [facility] [place] [phone]   Future Orders Complete By Expires   Discharge instructions  As directed    Comments:     Follow bariatric dietary guidelines   Increase activity slowly  As directed    No wound care  As directed        Medication List         ALPRAZolam 0.5 MG tablet  Commonly known as:  XANAX  Take 0.5 mg by mouth at bedtime.     buPROPion 150 MG 24 hr tablet  Commonly known as:  WELLBUTRIN XL  Take 150 mg by mouth every morning.     CYMBALTA 60 MG capsule  Generic drug:  DULoxetine  Take 60 mg by mouth every evening.     dexlansoprazole 60 MG capsule  Commonly known as:  DEXILANT  Take 60 mg by mouth daily.     gabapentin 300 MG capsule  Commonly known as:  NEURONTIN  Take 300 mg by mouth 3 (three) times daily.     oxyCODONE-acetaminophen 10-325 MG per tablet  Commonly known  as:  PERCOCET  Take 1 tablet by mouth every 6 (six) hours as needed for pain. Prn foot pain     QC WOMENS DAILY MULTIVITAMIN PO  Take 1 tablet by mouth daily.     traZODone 50 MG tablet  Commonly known as:  DESYREL  Take 50 mg by mouth at bedtime.           Follow-up Information   Follow up with Luretha MurphyMARTIN,Sara Kaus B, MD In 3 weeks.   Specialty:  General Surgery   Contact information:   [address] [place] [address] [phone]       Signed: [name],Sara [name] 10/05/2013, 10:45 AM

## 2013-10-11 ENCOUNTER — Encounter: Payer: Self-pay | Admitting: Family Medicine

## 2013-10-17 ENCOUNTER — Ambulatory Visit (INDEPENDENT_AMBULATORY_CARE_PROVIDER_SITE_OTHER): Payer: Medicare Other | Admitting: Family Medicine

## 2013-10-17 ENCOUNTER — Encounter: Payer: Medicare Other | Attending: Surgery

## 2013-10-17 ENCOUNTER — Encounter: Payer: Self-pay | Admitting: Family Medicine

## 2013-10-17 VITALS — BP 110/64 | HR 76 | Temp 98.0°F | Resp 18 | Ht 61.0 in | Wt 234.0 lb

## 2013-10-17 DIAGNOSIS — E669 Obesity, unspecified: Secondary | ICD-10-CM | POA: Insufficient documentation

## 2013-10-17 DIAGNOSIS — Z79899 Other long term (current) drug therapy: Secondary | ICD-10-CM

## 2013-10-17 DIAGNOSIS — Z713 Dietary counseling and surveillance: Secondary | ICD-10-CM | POA: Insufficient documentation

## 2013-10-17 DIAGNOSIS — F411 Generalized anxiety disorder: Secondary | ICD-10-CM

## 2013-10-17 LAB — COMPLETE METABOLIC PANEL WITH GFR
ALBUMIN: 4.5 g/dL (ref 3.5–5.2)
ALT: 44 U/L — AB (ref 0–35)
AST: 37 U/L (ref 0–37)
Alkaline Phosphatase: 81 U/L (ref 39–117)
BUN: 11 mg/dL (ref 6–23)
CALCIUM: 9.8 mg/dL (ref 8.4–10.5)
CHLORIDE: 102 meq/L (ref 96–112)
CO2: 21 meq/L (ref 19–32)
Creat: 0.79 mg/dL (ref 0.50–1.10)
GFR, Est Non African American: >89 mL/min
Glucose, Bld: 102 mg/dL — ABNORMAL HIGH (ref 70–99)
POTASSIUM: 4.7 meq/L (ref 3.5–5.3)
SODIUM: 138 meq/L (ref 135–145)
TOTAL PROTEIN: 8.1 g/dL (ref 6.0–8.3)
Total Bilirubin: 0.5 mg/dL (ref 0.2–1.2)

## 2013-10-17 LAB — CBC WITH DIFFERENTIAL/PLATELET
BASOS ABS: 0.1 10*3/uL (ref 0.0–0.1)
Basophils Relative: 1 % (ref 0–1)
Eosinophils Absolute: 0.5 10*3/uL (ref 0.0–0.7)
Eosinophils Relative: 5 % (ref 0–5)
HEMATOCRIT: 38.9 % (ref 36.0–46.0)
Hemoglobin: 13.1 g/dL (ref 12.0–15.0)
LYMPHS PCT: 35 % (ref 12–46)
Lymphs Abs: 3.2 10*3/uL (ref 0.7–4.0)
MCH: 28.4 pg (ref 26.0–34.0)
MCHC: 33.7 g/dL (ref 30.0–36.0)
MCV: 84.4 fL (ref 78.0–100.0)
MONO ABS: 0.5 10*3/uL (ref 0.1–1.0)
MONOS PCT: 6 % (ref 3–12)
NEUTROS PCT: 53 % (ref 43–77)
Neutro Abs: 4.8 10*3/uL (ref 1.7–7.7)
Platelets: 489 10*3/uL — ABNORMAL HIGH (ref 150–400)
RBC: 4.61 MIL/uL (ref 3.87–5.11)
RDW: 14.1 % (ref 11.5–15.5)
WBC: 9.1 10*3/uL (ref 4.0–10.5)

## 2013-10-17 LAB — LIPID PANEL
Cholesterol: 170 mg/dL (ref 0–200)
HDL: 33 mg/dL — ABNORMAL LOW (ref >39)
LDL Cholesterol: 122 mg/dL — ABNORMAL HIGH (ref 0–99)
Total CHOL/HDL Ratio: 5.2 Ratio
Triglycerides: 77 mg/dL (ref <150)
VLDL: 15 mg/dL (ref 0–40)

## 2013-10-17 MED ORDER — ALPRAZOLAM 0.5 MG PO TABS: 0.5000 mg | ORAL_TABLET | Freq: Every day | ORAL | Status: DC

## 2013-10-17 NOTE — Progress Notes (Signed)
Subjective:    [name], female    DOB: 07/20/1981, 33 y.o.   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 She is doing well after the surgery although she complains of early satiety, nausea. In the morning she is nauseated and breaks out in sweats. She's concerned her sugars may be low. She a history of elevated sugar prior to her gastric bypass. She also had a history of hyperlipidemia. She like to have her sugar levels checked as well as her cholesterol. Her surgeon had her discontinue Cymbalta and probed prior to her surgery. The patient stopped it abruptly. This anxiety has been much higher recently. She's been taking Xanax 2-3 times a day. Usually she takes it just at night to help her sleep. I can understand the temporary increase in the use of Xanax due to her increased anxiety over the abrupt discontinuation of an SNRI. She is requesting a refill on the Xanax as well. Past Medical History  Diagnosis Date  . Hyperlipidemia   . Obesity   . Hypertension   . GERD (gastroesophageal reflux disease)   . Vitamin D deficiency   . Transaminase or LDH elevation 04/24/2009  . Chronic back pain   . 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 Fasting hyperglycemia 05/12/2012  . Heart murmur, systolic   . Erosive esophagitis   . Neuromuscular disorder   . Depression   . Anxiety   . Stroke     2008/ no deficits  . Obstructive sleep apnea     CPAP- sleep study 8/14 EPIC   Past Surgical History  Procedure Laterality Date  . Foot surgery  05/2009    Dr Ramos(Left ankle)  . Esophageal manometry N/A 11/07/2012    Procedure: ESOPHAGEAL MANOMETRY (EM);  Surgeon: Mardella Layman, MD;  [facility];  Service: Endoscopy;  Laterality: N/A;  . Gastric roux-en-y N/A 10/03/2013    Procedure: LAPAROSCOPIC ROUX-EN-Y GASTRIC;  Surgeon: Valarie Merino, MD;  Location: [facility];  Service: General;   Laterality: N/A;   Current Outpatient Prescriptions on File Prior to Visit  Medication Sig Dispense Refill  . buPROPion (WELLBUTRIN XL) 150 MG 24 hr tablet Take 150 mg by mouth every morning.      . CYMBALTA 60 MG capsule Take 60 mg by mouth every evening.       Marland Kitchen dexlansoprazole (DEXILANT) 60 MG capsule Take 60 mg by mouth daily.      Marland Kitchen gabapentin (NEURONTIN) 300 MG capsule Take 300 mg by mouth 3 (three) times daily.       . Multiple Vitamins-Minerals (QC WOMENS DAILY MULTIVITAMIN PO) Take 1 tablet by mouth daily.      Marland Kitchen oxyCODONE-acetaminophen (PERCOCET) 10-325 MG per tablet Take 1 tablet by mouth every 6 (six) hours as needed for pain. Prn foot pain      . traZODone (DESYREL) 50 MG tablet Take 50 mg by mouth at bedtime.        No current facility-administered medications on file prior to visit.   No Known Allergies History   Social History  . Marital Status: Single    Spouse Name: N/A    Number of Children: 1  . Years of Education: N/A   Occupational History  . Disabled     Social History Main Topics  . Smoking status: Never Smoker   . Smokeless tobacco: Never Used  . Alcohol Use: No  .  Drug Use: No  . Sexual Activity: Yes   Other Topics Concern  . Not on file   Social History Narrative   Stopped caffeine      Review of Systems  All other systems reviewed and are negative.       Objective:   Physical Exam  Vitals reviewed. Constitutional: She appears well-developed and well-nourished. No distress.  HENT:  Mouth/Throat: No oropharyngeal exudate.  Eyes: Conjunctivae are normal. No scleral icterus.  Neck: Neck supple.  Cardiovascular: Normal rate, regular rhythm and normal heart sounds.   Pulmonary/Chest: Effort normal and breath sounds normal. No respiratory distress. She has no wheezes. She has no rales.  Abdominal: Soft. Bowel sounds are normal. She exhibits no distension. There is no tenderness. There is no rebound and no guarding.  Lymphadenopathy:     She has no cervical adenopathy.  Skin: She is not diaphoretic.  Psychiatric: She has a normal mood and affect. Her behavior is normal. Judgment and thought content normal.          Assessment & Plan:  GAD (generalized anxiety disorder)  Encounter for long-term (current) use of other medications - Plan: COMPLETE METABOLIC PANEL WITH GFR, CBC with Differential  Morbid obesity - Plan: COMPLETE METABOLIC PANEL WITH GFR, Lipid panel, CBC with Differential  I am concerned the patient may be experiencing hypoglycemia in the morning. I recommended that she supplement with Ensures until her appetite returns.  I will check a CMP along with a fasting lipid panel to evaluate her blood sugars will see her cholesterol. I also refilled her Xanax. I gave her 60 tablets this month as opposed to 30 tablets because I do anticipate she will require more Xanax until she is able to resume taking her Cymbalta and bupropion. She sees the surgeon in 2 weeks and hopefully at that point she will be cleared to resume her meds.  Otherwise she is doing well. There is no evidence of an illness or bowel obstruction. She has no evidence of DVT or wound infection after the surgery.

## 2013-10-17 NOTE — Patient Instructions (Signed)
Patient to follow Phase 3A-Soft, High Protein Diet and follow-up at [facility] in 6 weeks for 2 months post-op nutrition visit for diet advancement. 

## 2013-10-17 NOTE — Progress Notes (Signed)
Bariatric Class:  Appt start time: 1530 end time:  1630.  2 Week Post-Operative Nutrition Class  Patient was seen on 10/17/2013 for Post[facility]ative [facility]   Surgery date: 10/03/2013 Surgery type: RYGB Start weight at [facility]: 246 lbs on 03/13/13 Weight today: 234.0 lbs  Weight lost since last visit: 20 lbs Total weight lost:20 lbs  TANITA  BODY COMP RESULTS  09/15/13 10/17/13   BMI (kg/m^2) 48.0 44.2   Fat Mass (lbs) 133.0 123.0   Fat Free Mass (lbs) 121.0 111.0   Total Body Water (lbs) 88.5 81.5    The following the learning objectives were met by the patient during this course:  Identifies Phase 3A (Soft, High Proteins) Dietary Goals and will begin from 2 weeks post-operatively to 2 months post-operatively  Identifies appropriate sources of fluids and proteins   States protein recommendations and appropriate sources post-operatively  Identifies the need for appropriate texture modifications, mastication, and bite sizes when consuming solids  Identifies appropriate multivitamin and calcium sources post-operatively  Describes the need for physical activity post-operatively and will follow MD recommendations  States when to call healthcare provider regarding medication questions or post-operative complications  Handouts given during class include:  Phase 3A: Soft, High Protein Diet Handout  Follow-Up Plan: Patient will follow-up at [facility] in 6 weeks for 8 week post-op nutrition visit for diet advancement per MD.

## 2013-10-19 ENCOUNTER — Encounter (INDEPENDENT_AMBULATORY_CARE_PROVIDER_SITE_OTHER): Payer: Self-pay | Admitting: Surgery

## 2013-10-19 ENCOUNTER — Ambulatory Visit (INDEPENDENT_AMBULATORY_CARE_PROVIDER_SITE_OTHER): Payer: Medicare Other | Admitting: Surgery

## 2013-10-19 ENCOUNTER — Encounter (INDEPENDENT_AMBULATORY_CARE_PROVIDER_SITE_OTHER): Payer: Self-pay

## 2013-10-19 VITALS — BP 120/70 | HR 68 | Temp 98.1°F | Resp 12 | Ht 61.0 in | Wt 234.6 lb

## 2013-10-19 DIAGNOSIS — Z9884 Bariatric surgery status: Secondary | ICD-10-CM

## 2013-10-19 NOTE — Progress Notes (Signed)
Sara [name] 33 y.o.  Body mass index is 44.35 kg/(m^2).  Patient Active Problem List   Diagnosis Date Noted  . 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 Morbid obesity 08/24/2013  . Anxiety   . Obesity, unspecified 01/12/2013  . Internal hemorrhoids 10/28/2012  . Rectal bleeding 10/28/2012  . Anal fissure 10/28/2012  . Fasting hyperglycemia 05/12/2012  . Heart murmur, systolic   . Hyperlipidemia   . Obesity   . Obstructive sleep apnea   . Hypertension   . 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 Foot surgery  05/2009    Dr Ramos(Left ankle)  . Esophageal manometry N/A 11/07/2012    Procedure: ESOPHAGEAL MANOMETRY (EM);  Surgeon: Mardella Laymanavid R Patterson, MD;  [facility];  Service: Endoscopy;  Laterality: N/A;  . Gastric roux-en-y N/A 10/03/2013    Procedure: LAPAROSCOPIC ROUX-EN-Y GASTRIC;  Surgeon: Valarie MerinoMatthew B Navy Rothschild, MD;  Location: [facility];  Service: General;  Laterality: N/A;   Leo GrosserPICKARD,WARREN TOM, MD No diagnosis found.  Doing well.  Adjusting diet.  Weight down 16 lbs.  Incisions OK.  Taking vitamins.    Will see back in 3 months.  Matt B. Daphine DeutscherMartin, MD, [facility]  [facility] [phone] beeper [phone]  10/19/2013 9:21 AM

## 2013-11-01 ENCOUNTER — Encounter: Payer: Medicare Other | Admitting: Cardiology

## 2013-11-01 ENCOUNTER — Encounter: Payer: Self-pay | Admitting: Cardiology

## 2013-11-01 NOTE — Progress Notes (Signed)
Clinical Summary [name] is a 33 y.o.female seen today for follow up of the following medical problems. Former patient of Dr Dietrich Patesothbart, this is our first visit together.   1. HTN   2. HL   3. Heart murmur  - no significant pathology on prior echo 4. History of TIA  - episode in 06/2007 with transient right sided weakness and dysarthria. 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 Cerebral angio was normal. Subsequent carotid US 04/2012 without significant disease   5. Gastric bypass  Past Medical History  Diagnosis Date  . Hyperlipidemia   . Obesity   . Hypertension   . GERD (gastroesophageal reflux disease)   . Vitamin D deficiency   . Transaminase or LDH elevation 04/24/2009  . Chronic back pain   . 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 Fasting hyperglycemia 05/12/2012  . Heart murmur, systolic   . Erosive esophagitis   . Neuromuscular disorder   . Depression   . Anxiety   . Stroke     2008/ no deficits  . Obstructive sleep apnea     CPAP- sleep study 8/14 EPIC     No Known Allergies   Current Outpatient Prescriptions  Medication Sig Dispense Refill  . ALPRAZolam (XANAX) 0.5 MG tablet Take 1 tablet (0.5 mg total) by mouth at bedtime.  60 tablet  0  . buPROPion (WELLBUTRIN XL) 150 MG 24 hr tablet Take 150 mg by mouth every morning.      . CYMBALTA 60 MG capsule Take 60 mg by mouth every evening.       Marland Kitchen. dexlansoprazole (DEXILANT) 60 MG capsule Take 60 mg by mouth daily.      Marland Kitchen. gabapentin (NEURONTIN) 300 MG capsule Take 300 mg by mouth 3 (three) times daily.       . Multiple Vitamins-Minerals (QC WOMENS DAILY MULTIVITAMIN PO) Take 1 tablet by mouth daily.      . traZODone (DESYREL) 50 MG tablet Take 50 mg by mouth at bedtime.        No current facility-administered medications for this visit.     Past Surgical History  Procedure Laterality Date  . Foot surgery   05/2009    Dr Ramos(Left ankle)  . Esophageal manometry N/A 11/07/2012    Procedure: ESOPHAGEAL MANOMETRY (EM);  Surgeon: Mardella Laymanavid R Patterson, MD;  Location[facility];  Service: Endoscopy;  Laterality: N/A;  . Gastric roux-en-y N/A 10/03/2013    Procedure: LAPAROSCOPIC ROUX-EN-Y GASTRIC;  Surgeon: Valarie MerinoMatthew B [name], MD;  Location: [facility];  Service: General;  Laterality: N/A;     No Known Allergies    Family History  Problem Relation Age of Onset  . Diabetes Father   . Hypertension Father   . Multiple sclerosis Brother   . Asthma Other   . Breast cancer Mother   . Heart attack Mother   . Lung cancer Father   . Colon cancer Neg Hx      Social History Sara [name] reports that she has never smoked. She has never used smokeless tobacco. [name] reports that she does not drink alcohol.   Review of Systems CONSTITUTIONAL: No weight loss, fever, chills, weakness or fatigue.  HEENT: Eyes: No visual loss, blurred vision, double vision or yellow sclerae.No hearing loss, sneezing, congestion, runny nose or sore throat.  SKIN: No rash or itching.  CARDIOVASCULAR:  RESPIRATORY: No shortness of breath,  cough or sputum.  GASTROINTESTINAL: No anorexia, nausea, vomiting or diarrhea. No abdominal pain or blood.  GENITOURINARY: No burning on urination, no polyuria NEUROLOGICAL: No headache, dizziness, syncope, paralysis, ataxia, numbness or tingling in the extremities. No change in bowel or bladder control.  MUSCULOSKELETAL: No muscle, back pain, joint pain or stiffness.  LYMPHATICS: No enlarged nodes. No history of splenectomy.  PSYCHIATRIC: No history of depression or anxiety.  ENDOCRINOLOGIC: No reports of sweating, cold or heat intolerance. No polyuria or polydipsia.  Marland Kitchen   Physical Examination There were no vitals filed for this visit. There were no vitals filed for this visit.  Gen: resting comfortably, no acute distress HEENT: no scleral icterus, pupils equal round and  reactive, no palptable cervical adenopathy,  CV Resp: Clear to auscultation bilaterally GI: abdomen is soft, non-tender, non-distended, normal bowel sounds, no hepatosplenomegaly MSK: extremities are warm, no edema.  Skin: warm, no rash Neuro:  no focal deficits Psych: appropriate affect   Diagnostic Studies 04/2012 Carotid US:  Findings:  RIGHT CAROTID ARTERY: Very minor intimal thickening. No hemodynamically significant ICA stenosis, velocity elevation, or turbulent flow.  RIGHT VERTEBRAL ARTERY: Antegrade  LEFT CAROTID ARTERY: Very minor intimal thickening. No significant plaque. No hemodynamically significant left ICA stenosis, velocity elevation, or turbulent flow.  LEFT VERTEBRAL ARTERY: Antegrade  IMPRESSION: No hemodynamically significant stenosis by ultrasound  06/2007 Echo CLINICAL DATA: A 33 year old woman with CVA and murmur.  M-mode: Aorta 2.7, left atrium 3.7, septum 1.1, posterior wall 0.9, LV  diastole 3.9, LV systole 3.1.  1. Technically adequate echocardiographic study.  2. Normal left atrium, right atrium and right ventricle.  3. Normal and trileaflet aortic valve; normal mitral and tricuspid  valves; physiologic tricuspid regurgitation.  4. Pulmonic valve and proximal pulmonary artery are poorly imaged but  appear grossly normal.  5. Normal internal dimension, wall thickness, regional and global  function of the left ventricle.  6. IVC poorly imaged but is normal in diameter.  7. Contrast study is negative for right-to-left passage of contrast.     Assessment and Plan        Sara [name], M.D., F.A.C.C.

## 2013-11-20 ENCOUNTER — Other Ambulatory Visit: Payer: Self-pay | Admitting: Cardiology

## 2013-11-20 ENCOUNTER — Other Ambulatory Visit: Payer: Self-pay | Admitting: Family Medicine

## 2013-11-20 ENCOUNTER — Other Ambulatory Visit: Payer: Self-pay | Admitting: Gastroenterology

## 2013-11-20 ENCOUNTER — Other Ambulatory Visit: Payer: Self-pay | Admitting: Physician Assistant

## 2013-11-20 MED ORDER — ALPRAZOLAM 0.5 MG PO TABS: 0.5000 mg | ORAL_TABLET | Freq: Every day | ORAL | Status: DC

## 2013-11-20 MED ORDER — DEXLANSOPRAZOLE 60 MG PO CPDR: 60.0000 mg | DELAYED_RELEASE_CAPSULE | Freq: Every day | ORAL | Status: DC

## 2013-11-20 NOTE — Telephone Encounter (Signed)
Rx faxed to pharmacy  

## 2013-11-20 NOTE — Telephone Encounter (Signed)
This is on med list as "discontinued by provider" 09/22/13????

## 2013-11-20 NOTE — Telephone Encounter (Signed)
Last Rf 10/17/13 #60.  Rx for one a day??  OK refill?

## 2013-11-20 NOTE — Telephone Encounter (Signed)
Rx Refilled  

## 2013-12-18 ENCOUNTER — Encounter: Payer: Medicare Other | Attending: Surgery | Admitting: Dietician

## 2013-12-18 DIAGNOSIS — Z713 Dietary counseling and surveillance: Secondary | ICD-10-CM | POA: Insufficient documentation

## 2013-12-18 DIAGNOSIS — E669 Obesity, unspecified: Secondary | ICD-10-CM | POA: Diagnosis not present

## 2013-12-18 NOTE — Progress Notes (Signed)
  Follow-up visit:  11 Weeks Post-Operative RYGB Surgery  Medical Nutrition Therapy:  Appt start time: 1115 end time:  1145.  Primary concerns today: Post-operative Bariatric Surgery Nutrition Management. Stanton KidneyDebra returns today with reports of feeling tired. Not getting enough protein or fluids. She reports some taste aversion to sweet tastes. She states that she tried some bread and sweets and it did not go well.   Surgery date: 10/03/2013 Surgery type: RYGB [facility]: 246 lbs on 03/13/13 Weight today: 213.5 lbs Weight lost since last visit: 20.5 lbs Total weight lost: 32.5 lbs  TANITA  BODY COMP RESULTS  09/15/13 10/17/13 12/18/13   BMI (kg/m^2) 48.0 44.2 40.3   Fat Mass (lbs) 133.0 123.0 100   Fat Free Mass (lbs) 121.0 111.0 113.5   Total Body Water (lbs) 88.5 81.5 83    Preferred Learning Style:  No preference indicated   Learning Readiness:  Ready  24-hr recall: B (AM): bacon or sausage patty (7g) Snk (AM): sf jello or yogurt or popsicles  L (PM): sometimes skips if not hungry Snk (PM):   D (PM): 2 oz baked chicken or pork chops or meat loaf (14g) Snk (PM): grapes or jello or [place] yogurt (12g)  Fluid intake: 48 oz per patient estimate Estimated total protein intake: ~33g  Medications: no longer on BP meds Supplementation: taking  Using straws: no Drinking while eating: very small sips of water if food "won't go down" Hair loss: yes Carbonated beverages: no N/V/D/C: vomiting with sweets, bread, and grilled Chickfila nugget  Dumping syndrome: none  Recent physical activity:  30-60 minutes 3x a week (elliptical or treadmill and weights)  Progress Towards Goal(s):  In progress.  Handouts given during visit include:  Phase 3A lean proteins  Phase 3B lean protein + non-starchy vegetables   Nutritional Diagnosis:  [place]-3.3 Overweight/obesity related to past poor dietary habits and physical inactivity as evidenced by patient w/ recent RYGB surgery following  dietary guidelines for continued weight loss.     Intervention:  Nutrition counseling provided.  Teaching Method Utilized:  Visual Auditory Hands on  Barriers to learning/adherence to lifestyle change: none  Demonstrated degree of understanding via:  Teach Back   Monitoring/Evaluation:  Dietary intake, exercise, and body weight. Follow up in 1 months for 4 month post-op visit.

## 2013-12-18 NOTE — Patient Instructions (Addendum)
-  Aim for 60 grams of protein each day  -Deli meat, eggs, cheese, chicken salad  -Add protein powder to yogurt  -Eat protein foods first  -Have a protein shake if you are short on protein for the day  -Aim for 64 oz of fluids per day    TANITA  BODY COMP RESULTS  09/15/13 10/17/13 12/18/13   BMI (kg/m^2) 48.0 44.2 40.3   Fat Mass (lbs) 133.0 123.0 100   Fat Free Mass (lbs) 121.0 111.0 113.5   Total Body Water (lbs) 88.5 81.5 83

## 2014-01-05 NOTE — Progress Notes (Signed)
This encounter was created in error - please disregard.

## 2014-01-08 NOTE — Progress Notes (Signed)
This encounter was created in error - please disregard.

## 2014-01-09 ENCOUNTER — Encounter: Payer: Self-pay | Admitting: Family Medicine

## 2014-01-14 ENCOUNTER — Other Ambulatory Visit: Payer: Self-pay | Admitting: Family Medicine

## 2014-01-14 ENCOUNTER — Other Ambulatory Visit: Payer: Self-pay | Admitting: Adult Health

## 2014-01-15 ENCOUNTER — Ambulatory Visit: Payer: Medicare Other | Admitting: Family Medicine

## 2014-01-18 ENCOUNTER — Ambulatory Visit: Payer: Medicare Other | Admitting: Family Medicine

## 2014-01-18 ENCOUNTER — Ambulatory Visit: Payer: Medicare Other | Admitting: Dietician

## 2014-01-24 ENCOUNTER — Encounter: Payer: Self-pay | Admitting: Family Medicine

## 2014-01-26 ENCOUNTER — Other Ambulatory Visit: Payer: Self-pay | Admitting: Adult Health

## 2014-01-26 ENCOUNTER — Other Ambulatory Visit: Payer: Self-pay | Admitting: Family Medicine

## 2014-01-26 NOTE — Telephone Encounter (Signed)
Ok to refill??  Last office visit 10/17/2013.  Last refill 11/20/2013.

## 2014-01-26 NOTE — Telephone Encounter (Signed)
Okay to refill? 

## 2014-01-30 ENCOUNTER — Ambulatory Visit: Payer: Medicare Other | Admitting: Family Medicine

## 2014-02-27 ENCOUNTER — Ambulatory Visit: Payer: Medicare Other | Admitting: Dietician

## 2014-02-28 ENCOUNTER — Ambulatory Visit: Payer: Medicare Other | Admitting: Dietician

## 2014-03-04 ENCOUNTER — Other Ambulatory Visit: Payer: Self-pay | Admitting: Family Medicine

## 2014-03-05 MED ORDER — ALPRAZOLAM 0.5 MG PO TABS: ORAL_TABLET | ORAL | Status: DC

## 2014-03-05 NOTE — Telephone Encounter (Signed)
Med called out and pt aware via mychart

## 2014-03-08 ENCOUNTER — Encounter: Payer: Medicare Other | Attending: Surgery | Admitting: Dietician

## 2014-03-08 DIAGNOSIS — E669 Obesity, unspecified: Secondary | ICD-10-CM

## 2014-03-08 DIAGNOSIS — Z713 Dietary counseling and surveillance: Secondary | ICD-10-CM | POA: Diagnosis present

## 2014-03-08 NOTE — Progress Notes (Signed)
  Follow-up visit:  5 months Post-Operative RYGB Surgery  Medical Nutrition Therapy:  Appt start time: 1110 end time:  1135.  Primary concerns today: Post-operative Bariatric Surgery Nutrition Management. Sara [name] returns today with reports of feeling tired. Not getting enough protein but fluid intake has improved. She has been eating a lot of grapes and peanut butter crackers and also having some juice and corn. However, she has lost 18 pounds since her last visit.  Surgery date: 10/03/2013 Surgery type: RYGB Start weight at [facility]: 246 lbs on 03/13/13 Weight today: 195.5 lbs Weight lost since last visit: 18 lbs Total weight lost: 50.5 lbs  Goal weight: 130-140 lbs  TANITA  BODY COMP RESULTS  09/15/13 10/17/13 12/18/13 03/08/14   BMI (kg/m^2) 48.0 44.2 40.3 36.9   Fat Mass (lbs) 133.0 123.0 100 87   Fat Free Mass (lbs) 121.0 111.0 113.5 108.5   Total Body Water (lbs) 88.5 81.5 83 79.5    Preferred Learning Style:  No preference indicated   Learning Readiness:  Ready  24-hr recall: B (AM): 3 strips bacon (7g) or frosted flakes Snk (AM): yogurt or peanut butter cracker (12g)  L (PM): sometimes skips if not hungry Snk (PM):   D (PM): 2-3 oz baked chicken or pork chops or meat loaf (14-21g) Snk (PM): sugar free ice cream pop  Fluid intake: 54+ oz per patient estimate (water with lemon) Estimated total protein intake: 33-40g  Medications: no longer on BP meds Supplementation: not taking Calcium  Using straws: no Drinking while eating: sometimes Hair loss: yes Carbonated beverages: no N/V/D/C: none Dumping syndrome: none  Recent physical activity:  1-2 miles walking 3x a week  Progress Towards Goal(s):  In progress.   Nutritional Diagnosis:  [place]-3.3 Overweight/obesity related to past poor dietary habits and physical inactivity as evidenced by patient w/ recent RYGB surgery following dietary guidelines for continued weight loss.     Intervention:  Nutrition counseling  provided.  Teaching Method Utilized:  Visual Auditory Hands on  Barriers to learning/adherence to lifestyle change: none  Demonstrated degree of understanding via:  Teach Back   Monitoring/Evaluation:  Dietary intake, exercise, and body weight. Follow up in 3 months for 8 month post-op visit.

## 2014-03-08 NOTE — Patient Instructions (Addendum)
-  Add protein snacks  -String cheese or babybel cheese, deli meat with Laughing Cow cheese, boiled eggs or egg salad, P3 Lunchables, microwavable [place]key  Sausage, try PB2 and try adding to vanilla Premier protein shake  -Try Citracal Petites or Celebrate Calcium chews -Try dissolving Calcium in water  -Biotin for hair loss  TANITA  BODY COMP RESULTS  09/15/13 10/17/13 12/18/13 03/08/14   BMI (kg/m^2) 48.0 44.2 40.3 36.9   Fat Mass (lbs) 133.0 123.0 100 87   Fat Free Mass (lbs) 121.0 111.0 113.5 108.5   Total Body Water (lbs) 88.5 81.5 83 79.5

## 2014-03-31 ENCOUNTER — Other Ambulatory Visit: Payer: Self-pay | Admitting: Family Medicine

## 2014-03-31 NOTE — Telephone Encounter (Signed)
Ok to refill??  Last office visit 10/17/2013.  Last refill 03/05/2014.

## 2014-04-02 ENCOUNTER — Telehealth: Payer: Self-pay | Admitting: *Deleted

## 2014-04-02 MED ORDER — ALPRAZOLAM 0.5 MG PO TABS: ORAL_TABLET | ORAL | Status: DC

## 2014-04-02 NOTE — Telephone Encounter (Signed)
ok 

## 2014-04-02 NOTE — Telephone Encounter (Signed)
MD approved refill

## 2014-04-02 NOTE — Telephone Encounter (Signed)
Medication called to pharmacy. 

## 2014-04-06 ENCOUNTER — Other Ambulatory Visit: Payer: Self-pay | Admitting: Family Medicine

## 2014-04-06 ENCOUNTER — Encounter: Payer: Self-pay | Admitting: Family Medicine

## 2014-05-02 ENCOUNTER — Encounter: Payer: Self-pay | Admitting: *Deleted

## 2014-05-09 ENCOUNTER — Other Ambulatory Visit (HOSPITAL_COMMUNITY)
Admission: RE | Admit: 2014-05-09 | Discharge: 2014-05-09 | Disposition: A | Discharge status: Home / Self Care | Payer: Medicare Other | Source: Ambulatory Visit | Attending: Obstetrics & Gynecology | Admitting: Obstetrics & Gynecology

## 2014-05-09 ENCOUNTER — Encounter: Payer: Self-pay | Admitting: Obstetrics & Gynecology

## 2014-05-09 ENCOUNTER — Ambulatory Visit (INDEPENDENT_AMBULATORY_CARE_PROVIDER_SITE_OTHER): Payer: Medicare Other | Admitting: Obstetrics & Gynecology

## 2014-05-09 ENCOUNTER — Telehealth: Payer: Self-pay | Admitting: *Deleted

## 2014-05-09 VITALS — BP 114/62 | Ht 62.0 in | Wt 186.0 lb

## 2014-05-09 DIAGNOSIS — Z01419 Encounter for gynecological examination (general) (routine) without abnormal findings: Secondary | ICD-10-CM

## 2014-05-09 DIAGNOSIS — A5901 Trichomonal vulvovaginitis: Secondary | ICD-10-CM

## 2014-05-09 DIAGNOSIS — Z1151 Encounter for screening for human papillomavirus (HPV): Secondary | ICD-10-CM | POA: Insufficient documentation

## 2014-05-09 DIAGNOSIS — A599 Trichomoniasis, unspecified: Secondary | ICD-10-CM

## 2014-05-09 DIAGNOSIS — Z124 Encounter for screening for malignant neoplasm of cervix: Secondary | ICD-10-CM

## 2014-05-09 MED ORDER — METRONIDAZOLE 500 MG PO TABS: 500.0000 mg | ORAL_TABLET | Freq: Three times a day (TID) | ORAL | Status: DC

## 2014-05-09 MED ORDER — NAPROXEN SODIUM 550 MG PO TABS
550.0000 mg | ORAL_TABLET | Freq: Two times a day (BID) | ORAL | Status: DC
Start: 2014-05-09 — End: 2015-04-23

## 2014-05-09 NOTE — Progress Notes (Signed)
[name], female   DOB: February 05, 1981, 33 y.o.   MRN: [id number] Subjective:     Sara [name] is a 33 y.o. female here for a routine exam.  Patient's last menstrual period was 04/25/2014. No obstetric history on file. Birth Control Method:  Husband has erection issues Menstrual Calendar(currently): regular  Current complaints: nausea with menses.   Current acute medical issues:  none   Recent Gynecologic History Patient's last menstrual period was 04/25/2014. Last Pap: ?,  normal Last mammogram: ,    Past Medical History  Diagnosis Date  . Hyperlipidemia   . Obesity   . Hypertension   . GERD (gastroesophageal reflux disease)   . Vitamin D deficiency   . Transaminase or LDH elevation 04/24/2009  . Chronic back pain   . 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 Fasting hyperglycemia 05/12/2012  . Heart murmur, systolic   . Erosive esophagitis   . Neuromuscular disorder   . Depression   . Anxiety   . Stroke     2008/ no deficits  . Obstructive sleep apnea     CPAP- sleep study 8/14 EPIC    Past Surgical History  Procedure Laterality Date  . Foot surgery  05/2009    Dr Ramos(Left ankle)  . Esophageal manometry N/A 11/07/2012    Procedure: ESOPHAGEAL MANOMETRY (EM);  Surgeon: Mardella Laymanavid R Patterson, MD;  Location: [facility];  Service: Endoscopy;  Laterality: N/A;  . 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 , Disp: , Rfl:  Multiple Vitamins-Minerals (QC WOMENS DAILY MULTIVITAMIN PO), Take 1 tablet by mouth daily., Disp: , Rfl: ;  oxyCODONE-acetaminophen (PERCOCET) 10-325 MG per tablet, Take 1 tablet by mouth every 8 (eight) hours as needed. , Disp: , Rfl: ;  traZODone (DESYREL) 50 MG tablet, Take 50 mg by mouth at bedtime. , Disp: , Rfl: ;  buPROPion (WELLBUTRIN XL) 150 MG 24 hr tablet, Take 150 mg by mouth every morning., Disp: , Rfl:  CYMBALTA 60 MG capsule, Take 60 mg by mouth every evening. , Disp: , Rfl: ;  dexlansoprazole (DEXILANT) 60 MG capsule, Take 1 capsule (60 mg total) by mouth daily., Disp: 30 capsule, Rfl: 11;  lisinopril (PRINIVIL,ZESTRIL) 20 MG tablet, TAKE ONE TABLET BY MOUTH ONCE DAILY, Disp: 30 tablet, Rfl: 5  OB History   Grav Para Term Preterm Abortions TAB SAB Ect Mult Living                  History   Social History  . Marital Status: Single    Spouse Name: N/A    Number of Children: 1  . Years of Education: N/A   Occupational History  . Disabled     Social History Main Topics  . Smoking status: Never Smoker   . Smokeless tobacco: Never Used  . Alcohol Use: No  . Drug Use: No  . Sexual Activity: Yes  Other Topics Concern  . None   Social History Narrative   Stopped caffeine     Family History  Problem Relation Age of Onset  . Diabetes Father   . Hypertension Father   . Multiple sclerosis Brother   . Asthma Other   . Breast cancer Mother   . Heart attack Mother   . Lung cancer Father   . Colon cancer Neg Hx      Review of Systems  Review of Systems  Constitutional: Negative for fever, chills, weight loss, malaise/fatigue and diaphoresis.  HENT: Negative for hearing loss, ear pain, nosebleeds, congestion, sore throat, neck pain, tinnitus and ear discharge.   Eyes: Negative for blurred vision, double vision, photophobia, pain, discharge and redness.  Respiratory: Negative for cough, hemoptysis, sputum production, shortness of breath,  wheezing and stridor.   Cardiovascular: Negative for chest pain, palpitations, orthopnea, claudication, leg swelling and PND.  Gastrointestinal: negative for abdominal pain. Negative for heartburn, nausea, vomiting, diarrhea, constipation, blood in stool and melena.  Genitourinary: Negative for dysuria, urgency, frequency, hematuria and flank pain.  Musculoskeletal: Negative for myalgias, back pain, joint pain and falls.  Skin: Negative for itching and rash.  Neurological: Negative for dizziness, tingling, tremors, sensory change, speech change, focal weakness, seizures, loss of consciousness, weakness and headaches.  Endo/Heme/Allergies: Negative for environmental allergies and polydipsia. Does not bruise/bleed easily.  Psychiatric/Behavioral: Negative for depression, suicidal ideas, hallucinations, memory loss and substance abuse. The patient is not nervous/anxious and does not have insomnia.        Objective:    Physical Exam  Vitals reviewed. Constitutional: She is oriented to person, place, and time. She appears well-developed and well-nourished.  HENT:  Head: Normocephalic and atraumatic.        Right Ear: External ear normal.  Left Ear: External ear normal.  Nose: Nose normal.  Mouth/Throat: Oropharynx is clear and moist.  Eyes: Conjunctivae and EOM are normal. Pupils are equal, round, and reactive to light. Right eye exhibits no discharge. Left eye exhibits no discharge. No scleral icterus.  Neck: Normal range of motion. Neck supple. No tracheal deviation present. No thyromegaly present.  Cardiovascular: Normal rate, regular rhythm, normal heart sounds and intact distal pulses.  Exam reveals no gallop and no friction rub.   No murmur heard. Respiratory: Effort normal and breath sounds normal. No respiratory distress. She has no wheezes. She has no rales. She exhibits no tenderness.  GI: Soft. Bowel sounds are normal. She exhibits no distension and no mass. There is no tenderness.  There is no rebound and no guarding.  Genitourinary:  Breasts no masses skin changes or nipple changes bilaterally      Vulva is normal without lesions Vagina is pink moist with discharge, wet prep done Cervix normal in appearance and pap is done Uterus is normal size shape and contour Adnexa is negative with normal sized ovaries   Musculoskeletal: Normal range of motion. She exhibits no edema and no tenderness.  Neurological: She is alert and oriented to person, place, and time. She has normal reflexes. She displays normal reflexes. No cranial nerve deficit. She exhibits normal muscle tone. Coordination normal.  Skin: Skin is warm and dry. No rash noted. No erythema. No pallor.  Psychiatric: She has a normal mood and affect. Her behavior is normal. Judgment and thought content normal.   Wet prep:  + trichomonas, no yeast or BV     Assessment:    Healthy female exam.   trichomonas Plan:  Follow up in: 3 weeks. flagyl 500  BID x 7days for each partner

## 2014-05-09 NOTE — Telephone Encounter (Signed)
Pt informed Flagyl refill given for partner. Pt verbalized understanding.

## 2014-05-11 LAB — CYTOLOGY - PAP

## 2014-05-15 ENCOUNTER — Other Ambulatory Visit: Payer: Self-pay | Admitting: Family Medicine

## 2014-05-15 ENCOUNTER — Encounter: Payer: Self-pay | Admitting: Family Medicine

## 2014-05-15 NOTE — Telephone Encounter (Signed)
ok 

## 2014-05-15 NOTE — Telephone Encounter (Signed)
Ok to refill??  Last office visit 10/17/2013.  Last refill 04/02/2014.

## 2014-05-16 ENCOUNTER — Other Ambulatory Visit: Payer: Self-pay | Admitting: Family Medicine

## 2014-05-16 NOTE — Telephone Encounter (Signed)
Medication called to pharmacy. 

## 2014-05-20 ENCOUNTER — Encounter: Payer: Self-pay | Admitting: Obstetrics & Gynecology

## 2014-05-22 ENCOUNTER — Other Ambulatory Visit: Payer: Self-pay | Admitting: Obstetrics & Gynecology

## 2014-05-22 MED ORDER — FLUCONAZOLE 150 MG PO TABS: 150.0000 mg | ORAL_TABLET | Freq: Once | ORAL | Status: DC

## 2014-05-29 ENCOUNTER — Ambulatory Visit: Payer: Medicare Other | Admitting: Obstetrics & Gynecology

## 2014-05-30 ENCOUNTER — Ambulatory Visit: Payer: Medicare Other | Admitting: Obstetrics & Gynecology

## 2014-06-05 ENCOUNTER — Ambulatory Visit: Payer: Medicare Other | Admitting: Obstetrics & Gynecology

## 2014-06-08 ENCOUNTER — Encounter: Payer: Self-pay | Admitting: Obstetrics & Gynecology

## 2014-06-08 ENCOUNTER — Ambulatory Visit (INDEPENDENT_AMBULATORY_CARE_PROVIDER_SITE_OTHER): Payer: Medicare Other | Admitting: Obstetrics & Gynecology

## 2014-06-08 VITALS — BP 130/90 | Ht 61.0 in | Wt 184.0 lb

## 2014-06-08 DIAGNOSIS — A5901 Trichomonal vulvovaginitis: Secondary | ICD-10-CM

## 2014-06-08 NOTE — Progress Notes (Signed)
Patient ID: Sara RoysDebra C Edwards, female   DOB: 1981/02/05, 33 y.o.   MRN: [id number] Pt had trichomonal vaginal infection  S/p metronidazole for 1 week Partner treated as well  In today for follow up wet prep  No intervl complaints  Blood pressure 130/90, height 5\' 1"  (1.549 m), weight 184 lb (83.462 kg), last menstrual period 05/25/2014.  Wet prep Normal , no trichomonas, no BV no yeast  Resolved trichomonas after treatment   Follow up for yearly 11 months

## 2014-06-12 ENCOUNTER — Encounter: Payer: Self-pay | Admitting: Family Medicine

## 2014-06-12 ENCOUNTER — Other Ambulatory Visit: Payer: Self-pay | Admitting: Family Medicine

## 2014-06-12 ENCOUNTER — Ambulatory Visit: Payer: Medicare Other | Admitting: Dietician

## 2014-06-13 MED ORDER — ALPRAZOLAM 0.5 MG PO TABS: ORAL_TABLET | ORAL | Status: DC

## 2014-06-14 ENCOUNTER — Encounter: Payer: Self-pay | Admitting: Family Medicine

## 2014-06-19 ENCOUNTER — Ambulatory Visit: Payer: Medicare Other | Admitting: Dietician

## 2014-06-19 ENCOUNTER — Ambulatory Visit: Payer: Medicare Other | Admitting: Family Medicine

## 2014-06-21 ENCOUNTER — Ambulatory Visit: Payer: Medicare Other | Admitting: Family Medicine

## 2014-06-28 ENCOUNTER — Ambulatory Visit: Payer: Medicare Other | Admitting: Dietician

## 2014-07-02 ENCOUNTER — Ambulatory Visit: Payer: Medicare Other | Admitting: Family Medicine

## 2014-07-03 ENCOUNTER — Ambulatory Visit: Payer: Medicare Other | Admitting: Dietician

## 2014-08-07 ENCOUNTER — Encounter: Payer: Self-pay | Admitting: Family Medicine

## 2014-08-07 ENCOUNTER — Encounter: Payer: Self-pay | Admitting: *Deleted

## 2014-08-14 ENCOUNTER — Ambulatory Visit (INDEPENDENT_AMBULATORY_CARE_PROVIDER_SITE_OTHER): Payer: Medicare Other | Admitting: Family Medicine

## 2014-08-14 ENCOUNTER — Encounter: Payer: Self-pay | Admitting: Family Medicine

## 2014-08-14 VITALS — BP 116/74 | HR 64 | Temp 98.5°F | Resp 18 | Wt 172.0 lb

## 2014-08-14 DIAGNOSIS — F329 Major depressive disorder, single episode, unspecified: Secondary | ICD-10-CM

## 2014-08-14 DIAGNOSIS — F32A Depression, unspecified: Secondary | ICD-10-CM

## 2014-08-14 MED ORDER — VENLAFAXINE HCL ER 75 MG PO CP24: ORAL_CAPSULE | ORAL | Status: DC

## 2014-08-14 NOTE — Progress Notes (Signed)
Subjective:    [name], female    DOB: Dec 18, 1980, 34 y.o.   MRN: [id number]  HPI Patient has a history of depression ever since the death of her daughter.  She has tried and failed prozac and lexapro.  She has been on Wellbutrin and cymbalta for well over a year.  She discontinued the medication independently because she felt that the medication was not working. Since discontinuing the medication, she reports worsening depression, increased mood swings, problem controlling her anger, anhedonia, poor concentration, poor energy. She denies any homicidal or suicidal ideation. She denies any insomnia or racing thoughts or manic symptoms. Past Medical History  Diagnosis Date  . Hyperlipidemia   . Obesity   . Hypertension   . GERD (gastroesophageal reflux disease)   . Vitamin D deficiency   . Transaminase or LDH elevation 04/24/2009  . Chronic back pain   . 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 Fasting hyperglycemia 05/12/2012  . Heart murmur, systolic   . Erosive esophagitis   . Neuromuscular disorder   . Depression   . Anxiety   . Stroke     2008/ no deficits  . Obstructive sleep apnea     CPAP- sleep study 8/14 EPIC   Past Surgical History  Procedure Laterality Date  . Foot surgery  05/2009    Dr Ramos(Left ankle)  . Esophageal manometry N/A 11/07/2012    Procedure: ESOPHAGEAL MANOMETRY (EM);  Surgeon: Mardella Layman, MD;  Location: [facility];  Service: Endoscopy;  Laterality: N/A;  . 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 ALPRAZolam (XANAX) 0.5 MG tablet 1 tab po qhs and prn anxiety 60 tablet 2  . gabapentin (NEURONTIN) 300 MG capsule Take 300 mg by mouth 3 (three) times  daily.     . Multiple Vitamins-Minerals (QC WOMENS DAILY MULTIVITAMIN PO) Take 1 tablet by mouth daily.    Marland Kitchen oxyCODONE-acetaminophen (PERCOCET) 10-325 MG per tablet Take 1 tablet by mouth every 8 (eight) hours as needed.     . traZODone (DESYREL) 50 MG tablet Take 50 mg by mouth at bedtime.     Marland Kitchen atorvastatin (LIPITOR) 80 MG tablet TAKE ONE TABLET BY MOUTH EVERY DAY (Patient not taking: Reported on 08/14/2014) 30 tablet 0  . buPROPion (WELLBUTRIN XL) 150 MG 24 hr tablet Take 150 mg by mouth every morning.    . CYMBALTA 60 MG capsule Take 60 mg by mouth every evening.     Marland Kitchen dexlansoprazole (DEXILANT) 60 MG capsule Take 1 capsule (60 mg total) by mouth daily. (Patient not taking: Reported on 08/14/2014) 30 capsule 11  . lisinopril (PRINIVIL,ZESTRIL) 20 MG tablet TAKE ONE TABLET BY MOUTH ONCE DAILY (Patient not taking: Reported on 08/14/2014) 30 tablet 5  . naproxen sodium (ANAPROX DS) 550 MG tablet Take 1 tablet (550 mg total) by mouth 2 (two) times daily with a meal. (Patient not taking: Reported on 08/14/2014) 60 tablet 1   No current facility-administered medications on file prior to visit.   No Known Allergies History   Social History  . Marital Status: Single    Spouse Name: N/A    Number of Children: 1  . Years of Education: N/A   Occupational History  .  Disabled     Social History Main Topics  . Smoking status: Never Smoker   . Smokeless tobacco: Never Used  . Alcohol Use: No  . Drug Use: No  . Sexual Activity: Yes   Other Topics Concern  . Not on file   Social History Narrative   Stopped caffeine       Review of Systems  All other systems reviewed and are negative.      Objective:   Physical Exam  Cardiovascular: Normal rate, regular rhythm and normal heart sounds.   Pulmonary/Chest: Effort normal and breath sounds normal. No respiratory distress. She has no wheezes. She has no rales.  Psychiatric: She has a normal mood and affect. Her behavior is normal. Judgment and  thought content normal.  Vitals reviewed.         Assessment & Plan:  Depression - Plan: venlafaxine XR (EFFEXOR XR) 75 MG 24 hr capsule  Begin Effexor XR 75 mg by mouth every morning and increased to 150 mg by mouth every morning after 1 week. Recheck in one month. If no better at that time consider augmenting with Lamictal versus Abilify.

## 2014-08-27 ENCOUNTER — Encounter: Payer: Self-pay | Admitting: Family Medicine

## 2014-09-11 ENCOUNTER — Encounter: Payer: Self-pay | Admitting: Family Medicine

## 2014-09-11 ENCOUNTER — Other Ambulatory Visit: Payer: Self-pay | Admitting: Family Medicine

## 2014-09-12 ENCOUNTER — Encounter: Payer: Self-pay | Admitting: Family Medicine

## 2014-09-12 NOTE — Telephone Encounter (Signed)
Per MD notes on e-mailed request, medication called to pharmacy.

## 2014-09-12 NOTE — Telephone Encounter (Signed)
Medication called to pharmacy on 09/12/2014.  Patient made aware via prior MyChart message.

## 2014-12-09 ENCOUNTER — Encounter: Payer: Self-pay | Admitting: Family Medicine

## 2014-12-09 ENCOUNTER — Other Ambulatory Visit: Payer: Self-pay | Admitting: Family Medicine

## 2014-12-10 ENCOUNTER — Encounter: Payer: Self-pay | Admitting: Family Medicine

## 2014-12-10 ENCOUNTER — Other Ambulatory Visit: Payer: Self-pay | Admitting: Family Medicine

## 2014-12-10 MED ORDER — VENLAFAXINE HCL ER 75 MG PO CP24: 150.0000 mg | ORAL_CAPSULE | Freq: Every day | ORAL | Status: DC

## 2014-12-10 MED ORDER — ALPRAZOLAM 0.5 MG PO TABS: ORAL_TABLET | ORAL | Status: DC

## 2014-12-10 NOTE — Telephone Encounter (Signed)
Patient has not F/U in (1) month after medication.   Medication filled x1 with no refills.   Requires office visit before any further refills can be given.   Letter sent.

## 2014-12-10 NOTE — Telephone Encounter (Signed)
Medication filled x1 with no refills.   Requires office visit before any further refills can be given.   Letter sent.  

## 2014-12-20 ENCOUNTER — Ambulatory Visit (INDEPENDENT_AMBULATORY_CARE_PROVIDER_SITE_OTHER): Payer: Medicare Other | Admitting: Family Medicine

## 2014-12-20 ENCOUNTER — Encounter: Payer: Self-pay | Admitting: Family Medicine

## 2014-12-20 VITALS — BP 122/68 | HR 72 | Temp 98.1°F | Resp 16 | Ht 61.0 in | Wt 171.0 lb

## 2014-12-20 DIAGNOSIS — F32A Depression, unspecified: Secondary | ICD-10-CM

## 2014-12-20 DIAGNOSIS — F329 Major depressive disorder, single episode, unspecified: Secondary | ICD-10-CM | POA: Diagnosis not present

## 2014-12-20 MED ORDER — VENLAFAXINE HCL ER 225 MG PO TB24: 1.0000 | ORAL_TABLET | Freq: Every day | ORAL | Status: DC

## 2014-12-20 NOTE — Progress Notes (Signed)
Subjective:    [name], female    DOB: [birth year], 34 y.o.   MRN: [id number]  HPI 08/15/14 Patient has a history of depression ever since the death of her daughter.  She has tried and failed prozac and lexapro.  She has been on Wellbutrin and cymbalta for well over a year.  She discontinued the medication independently because she felt that the medication was not working. Since discontinuing the medication, she reports worsening depression, increased mood swings, problem controlling her anger, anhedonia, poor concentration, poor energy. She denies any homicidal or suicidal ideation. She denies any insomnia or racing thoughts or manic symptoms.  At that time, my plan was: Begin Effexor XR 75 mg by mouth every morning and increased to 150 mg by mouth every morning after 1 week. Recheck in one month. If no better at that time consider augmenting with Lamictal versus Abilify.  12/20/14 Patient is here today to follow-up.  Patient states that she is doing approximately 50% better on the venlafaxine. She can definitely tell improvement as far as the symptoms of depression, anhedonia, insomnia, and anxiety. She denies any suicidal ideation. She continues to have panic attacks but infrequently. She continues to have difficult days however. For instance her deceased daughter's birthday is coming up next week and she knows this is going to a difficult time for her. Past Medical History  Diagnosis Date  . Hyperlipidemia   . Obesity   . Hypertension   . GERD (gastroesophageal reflux disease)   . Vitamin D deficiency   . Transaminase or LDH elevation 04/24/2009  . Chronic back pain   . 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 Fasting hyperglycemia 05/12/2012  . Heart murmur, systolic   . Erosive esophagitis   . Neuromuscular disorder   . Depression   . Anxiety   . Stroke     2008/ no deficits  . Obstructive sleep apnea     CPAP- sleep study 8/14 EP[facility]   Past Surgical History  Procedure Laterality Date  . Foot surgery  05/2009    Dr Ramos(Left ankle)  . Esophageal manometry N/A 11/07/2012    Procedure: ESOPHAGEAL MANOMETRY (EM);  Surgeon: Mardella Laymanavid R Patterson, MD;  Location: [facility];  Service: Endoscopy;  Laterality: N/A;  . 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 ALPRAZolam (XANAX) 0.5 MG tablet TAKE ONE TABLET BY MOUTH AT BEDTIME AND AS NEEDED FOR ANXIETY **MUST LAST 30 DAYS BETWEEN REFILLS PER MD** 60 tablet 2  . atorvastatin (LIPITOR) 80 MG tablet TAKE ONE TABLET BY MOUTH EVERY DAY (Patient not taking: Reported on 08/14/2014) 30 tablet 0  . buPROPion (WELLBUTRIN XL) 150 MG 24 hr tablet Take 150 mg by mouth every morning.    . CYMBALTA 60 MG capsule Take 60 mg by mouth every evening.     Marland Kitchen. dexlansoprazole (DEXILANT) 60 MG capsule Take 1 capsule (60 mg total) by mouth daily. (Patient not taking: Reported on 08/14/2014) 30 capsule 11  . gabapentin (NEURONTIN) 300 MG capsule Take 300 mg by mouth 3 (three) times daily.     Marland Kitchen. lisinopril (PRINIVIL,ZESTRIL) 20 MG tablet TAKE ONE TABLET BY MOUTH ONCE DAILY (Patient not taking: Reported on 08/14/2014) 30 tablet 5  .  Multiple Vitamins-Minerals (QC WOMENS DAILY MULTIVITAMIN PO) Take 1 tablet by mouth daily.    . naproxen sodium (ANAPROX DS) 550 MG tablet Take 1 tablet (550 mg total) by mouth 2 (two) times daily with a meal. (Patient not taking: Reported on 08/14/2014) 60 tablet 1  . oxyCODONE-acetaminophen (PERCOCET) 10-325 MG per tablet Take 1 tablet by mouth every 8 (eight) hours as needed.     . traZODone (DESYREL) 50 MG tablet Take 50 mg by mouth at bedtime.     Marland Kitchen. venlafaxine XR (EFFEXOR-XR) 75 MG 24 hr capsule Take 2 capsules (150 mg total) by mouth daily  with breakfast. 60 capsule 0   No current facility-administered medications on file prior to visit.   No Known Allergies History   Social History  . Marital Status: Single    Spouse Name: N/A  . Number of Children: 1  . Years of Education: N/A   Occupational History  . Disabled     Social History Main Topics  . Smoking status: Never Smoker   . Smokeless tobacco: Never Used  . Alcohol Use: No  . Drug Use: No  . Sexual Activity: Yes   Other Topics Concern  . Not on file   Social History Narrative   Stopped caffeine       Review of Systems  All other systems reviewed and are negative.      Objective:   Physical Exam  Cardiovascular: Normal rate, regular rhythm and normal heart sounds.   Pulmonary/Chest: Effort normal and breath sounds normal. No respiratory distress. She has no wheezes. She has no rales.  Psychiatric: She has a normal mood and affect. Her behavior is normal. Judgment and thought content normal.  Vitals reviewed.         Assessment & Plan:  Depression - Plan: Venlafaxine HCl 225 MG TB24  Crease venlafaxine extended release to 225 mg by mouth daily. Recheck in one month. If no better consider augmenting with Lamictal rather than continuing the increased dose of venlafaxine

## 2015-01-25 ENCOUNTER — Telehealth: Payer: Self-pay | Admitting: Family Medicine

## 2015-01-29 MED ORDER — ALPRAZOLAM 0.5 MG PO TABS: ORAL_TABLET | ORAL | Status: DC

## 2015-01-29 NOTE — Telephone Encounter (Signed)
Med called to pharm to be filled on 02/08/15 and pt aware via mychart message

## 2015-01-29 NOTE — Telephone Encounter (Signed)
ok 

## 2015-01-29 NOTE — Addendum Note (Signed)
Addended by: Legrand Rams B on: 01/29/2015 09:48 AM   Modules accepted: Orders

## 2015-02-26 ENCOUNTER — Encounter: Payer: Self-pay | Admitting: Family Medicine

## 2015-04-05 ENCOUNTER — Encounter: Payer: Self-pay | Admitting: Family Medicine

## 2015-04-08 ENCOUNTER — Encounter: Payer: Self-pay | Admitting: Family Medicine

## 2015-04-11 ENCOUNTER — Encounter: Payer: Self-pay | Admitting: Family Medicine

## 2015-04-23 ENCOUNTER — Ambulatory Visit (INDEPENDENT_AMBULATORY_CARE_PROVIDER_SITE_OTHER): Payer: Medicare Other | Admitting: Family Medicine

## 2015-04-23 ENCOUNTER — Encounter: Payer: Self-pay | Admitting: Family Medicine

## 2015-04-23 VITALS — BP 108/68 | HR 78 | Temp 98.4°F | Resp 16 | Ht 61.0 in | Wt 168.0 lb

## 2015-04-23 DIAGNOSIS — F329 Major depressive disorder, single episode, unspecified: Secondary | ICD-10-CM

## 2015-04-23 DIAGNOSIS — F319 Bipolar disorder, unspecified: Secondary | ICD-10-CM

## 2015-04-23 DIAGNOSIS — F32A Depression, unspecified: Secondary | ICD-10-CM

## 2015-04-23 MED ORDER — VENLAFAXINE HCL ER 225 MG PO TB24: 1.0000 | ORAL_TABLET | Freq: Every day | ORAL | Status: DC

## 2015-04-23 MED ORDER — LAMOTRIGINE 25 MG PO TABS: 50.0000 mg | ORAL_TABLET | Freq: Every day | ORAL | Status: DC

## 2015-04-23 NOTE — Progress Notes (Signed)
Subjective:    [name], female    DOB: April 19, 1981, 34 y.o.   MRN: [id number]  HPI Please see my last office visit. At that time patient was increased on Effexor. She is currently on 225 mg by mouth daily. Anxiety and depression seems better however the patient continues to have frequent mood swings. She states that she will fly off the handle with very little provocation. She has a difficult time controlling her anger. She also reports trouble sleeping. She will report intrusive thoughts that keep her awake. She denies any delusions. She denies any grandiose or hyper inflated self-esteem. She denies any increase in goal-directed behavior. She denies any hallucinations. She does have a family history of bipolar and her mother. Her biggest concern are mood swings, insomnia, anger control issues. Past Medical History  Diagnosis Date  . Hyperlipidemia   . Obesity   . Hypertension   . GERD (gastroesophageal reflux disease)   . Vitamin D deficiency   . Transaminase or LDH elevation 04/24/2009  . Chronic back pain   . 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 Fasting hyperglycemia 05/12/2012  . Heart murmur, systolic   . Erosive esophagitis   . Neuromuscular disorder   . Depression   . Anxiety   . Stroke     2008/ no deficits  . Obstructive sleep apnea     CPAP- sleep study 8/14 [facility]   Past Surgical History  Procedure Laterality Date  . Foot surgery  05/2009    Dr Ramos(Left ankle)  . Esophageal manometry N/A 11/07/2012    Procedure: ESOPHAGEAL MANOMETRY (EM);  Surgeon: Mardella Layman, MD;  Location: [facility];  Service: Endoscopy;  Laterality: N/A;  . 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 ALPRAZolam (XANAX) 0.5 MG tablet TAKE ONE TABLET BY MOUTH AT BEDTIME AND AS NEEDED FOR ANXIETY **MUST LAST 30 DAYS BETWEEN REFILLS PER MD** 60 tablet 2  . gabapentin (NEURONTIN) 300 MG capsule Take 300 mg by mouth 3 (three) times daily.     . Multiple Vitamins-Minerals (QC WOMENS DAILY MULTIVITAMIN PO) Take 1 tablet by mouth daily.    . Venlafaxine HCl 225 MG TB24 Take 1 tablet (225 mg total) by mouth daily after breakfast. 30 each 5   No current facility-administered medications on file prior to visit.   No Known Allergies Social History   Social History  . Marital Status: Single    Spouse Name: N/A  . Number of Children: 1  . Years of Education: N/A   Occupational History  . Disabled     Social History Main Topics  . Smoking status: Never Smoker   . Smokeless tobacco: Never Used  . Alcohol Use: No  . Drug Use: No  . Sexual Activity: Yes   Other Topics Concern  . Not on file   Social History Narrative   Stopped caffeine      Review of Systems  All other systems reviewed and are negative.      Objective:   Physical Exam  Cardiovascular: Normal rate, regular rhythm and normal heart sounds.   Pulmonary/Chest: Effort normal and breath sounds normal.  Psychiatric: She has a normal mood and affect. Her behavior is  normal. Judgment and thought content normal.  Vitals reviewed.         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 I do not believe she meets criteria for bipolar 1 but possibly rapid cycling bipolar 2. I would like to add lamictal as a mood stabilizer.  Begin 25 mg by mouth daily at bedtime. In one week increase to 50 mg by mouth daily at bedtime. Recheck in 4 weeks. At that time increased to 100 mg depending upon the patient's response.  Continue Effexor XR at 225 mg by mouth daily.

## 2015-04-25 ENCOUNTER — Encounter: Payer: Self-pay | Admitting: Family Medicine

## 2015-05-06 ENCOUNTER — Encounter: Payer: Self-pay | Admitting: Family Medicine

## 2015-05-06 MED ORDER — LAMOTRIGINE 100 MG PO TABS: 100.0000 mg | ORAL_TABLET | Freq: Every day | ORAL | Status: DC

## 2015-05-06 NOTE — Telephone Encounter (Signed)
Medication called/sent to requested pharmacy and pt aware via mychart 

## 2015-05-26 ENCOUNTER — Encounter: Payer: Self-pay | Admitting: Family Medicine

## 2015-05-28 ENCOUNTER — Encounter: Payer: Self-pay | Admitting: Family Medicine

## 2015-05-28 ENCOUNTER — Other Ambulatory Visit: Payer: Self-pay | Admitting: Family Medicine

## 2015-05-28 NOTE — Telephone Encounter (Signed)
?   OK to Refill  

## 2015-05-28 NOTE — Telephone Encounter (Signed)
ok 

## 2015-05-29 ENCOUNTER — Encounter: Payer: Self-pay | Admitting: Family Medicine

## 2015-05-31 MED ORDER — FLUCONAZOLE 150 MG PO TABS: 150.0000 mg | ORAL_TABLET | Freq: Once | ORAL | Status: DC

## 2015-07-26 ENCOUNTER — Other Ambulatory Visit: Payer: Self-pay | Admitting: Family Medicine

## 2015-08-15 ENCOUNTER — Ambulatory Visit: Payer: Self-pay | Admitting: Family Medicine

## 2015-08-21 ENCOUNTER — Encounter: Payer: Self-pay | Admitting: Family Medicine

## 2015-08-23 ENCOUNTER — Ambulatory Visit (INDEPENDENT_AMBULATORY_CARE_PROVIDER_SITE_OTHER): Payer: Medicare Other | Admitting: Family Medicine

## 2015-08-23 ENCOUNTER — Encounter: Payer: Self-pay | Admitting: Family Medicine

## 2015-08-23 VITALS — BP 122/60 | HR 78 | Temp 98.0°F | Resp 16 | Wt 186.0 lb

## 2015-08-23 DIAGNOSIS — B86 Scabies: Secondary | ICD-10-CM | POA: Diagnosis not present

## 2015-08-23 MED ORDER — PERMETHRIN 5 % EX CREA: 1.0000 "application " | TOPICAL_CREAM | Freq: Once | CUTANEOUS | Status: DC

## 2015-08-23 NOTE — Progress Notes (Signed)
Subjective:    [name], female    DOB: 27-Apr-1981, 35 y.o.   MRN: [id number]  HPI  patient has been itching all over her body for the last 2 weeks. There is not much of a rash. The rash that she shows me on her upper arms and on her chest are 1 mm excoriated red sores from where she has been scratching. Most of these are in linear groupings consistent with fingernail scratches. There is no actual original rash  On her chest other than the scratch marks. She also states that her husband has been scratching and itching recently. Rash is confined to lower than the neck. She denies any itching on her legs or in her genital area Past Medical History  Diagnosis Date  . Hyperlipidemia   . Obesity   . Hypertension   . GERD (gastroesophageal reflux disease)   . Vitamin D deficiency   . Transaminase or LDH elevation 04/24/2009  . Chronic back pain   . 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 Fasting hyperglycemia 05/12/2012  . Heart murmur, systolic   . Erosive esophagitis   . Neuromuscular disorder (HCC)   . Depression   . Anxiety   . Stroke (HCC)     2008/ no deficits  . Obstructive sleep apnea     CPAP- sleep study 8/14 EP[facility]   Past Surgical History  Procedure Laterality Date  . Foot surgery  05/2009    Dr Ramos(Left ankle)  . Esophageal manometry N/A 11/07/2012    Procedure: ESOPHAGEAL MANOMETRY (EM);  Surgeon: Mardella Layman, MD;  Location: [facility];  Service: Endoscopy;  Laterality: N/A;  . 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 ALPRAZolam (XANAX) 0.5 MG tablet TAKE ONE TABLET BY MOUTH AT BEDTIME AND/OR AS NEEDED FOR ANXIETY **MUST LAST 30 DAYS APART PER MD** 60 tablet 2  .  gabapentin (NEURONTIN) 300 MG capsule Take 300 mg by mouth 3 (three) times daily.     Marland Kitchen lamoTRIgine (LAMICTAL) 100 MG tablet Take 1 tablet (100 mg total) by mouth daily. 30 tablet 5  . lamoTRIgine (LAMICTAL) 25 MG tablet TAKE TWO TABLETS BY MOUTH ONCE DAILY AT BEDTIME 60 tablet 5  . Multiple Vitamins-Minerals (QC WOMENS DAILY MULTIVITAMIN PO) Take 1 tablet by mouth daily.    . Venlafaxine HCl 225 MG TB24 Take 1 tablet (225 mg total) by mouth daily after breakfast. 30 each 5   No current facility-administered medications on file prior to visit.   No Known Allergies Social History   Social History  . Marital Status: Single    Spouse Name: N/A  . Number of Children: 1  . Years of Education: N/A   Occupational History  . Disabled     Social History Main Topics  . Smoking status: Never Smoker   . Smokeless tobacco: Never Used  . Alcohol Use: No  . Drug Use: No  . Sexual Activity: Yes   Other Topics Concern  . Not on file   Social History Narrative   Stopped caffeine       Review of Systems  All other systems reviewed and are negative.      Objective:   Physical Exam  Cardiovascular:  Normal rate, regular rhythm and normal heart sounds.   Pulmonary/Chest: Effort normal and breath sounds normal. No respiratory distress. She has no wheezes. She has no rales.  Skin: Rash noted.  Vitals reviewed.         Assessment & Plan:  Scabies - Plan: permethrin (ELIMITE) 5 % cream   Physical exam is unremarkable. History concerns me about possible scabies exposure. I will treat the patient with Elimite cream. Apply head to toe and rinse off after 8 hours. I will also treat her husband. I recommended that they wash all her bed linens , etc.. Recheck in one week if no better

## 2015-08-25 ENCOUNTER — Encounter: Payer: Self-pay | Admitting: Family Medicine

## 2015-08-26 MED ORDER — PREDNISONE 20 MG PO TABS: ORAL_TABLET | ORAL | Status: DC

## 2015-08-26 NOTE — Telephone Encounter (Signed)
Pt aware via mychart and med sent to pharm 

## 2015-08-27 ENCOUNTER — Other Ambulatory Visit: Payer: Self-pay | Admitting: Family Medicine

## 2015-08-27 ENCOUNTER — Encounter: Payer: Self-pay | Admitting: Family Medicine

## 2015-08-27 NOTE — Telephone Encounter (Signed)
Ok to refill??  Last office visit 08/23/2015.  Last refill 05/18/2015, #2 refills.

## 2015-08-27 NOTE — Telephone Encounter (Signed)
ok 

## 2015-09-04 ENCOUNTER — Encounter: Payer: Self-pay | Admitting: Family Medicine

## 2015-09-04 ENCOUNTER — Other Ambulatory Visit: Payer: Self-pay | Admitting: Family Medicine

## 2015-09-04 DIAGNOSIS — B86 Scabies: Secondary | ICD-10-CM

## 2015-09-05 NOTE — Telephone Encounter (Signed)
Refills denied, pt NTBS per provider

## 2015-09-09 ENCOUNTER — Ambulatory Visit: Payer: Medicare Other | Admitting: Family Medicine

## 2015-09-19 ENCOUNTER — Encounter: Payer: Self-pay | Admitting: Family Medicine

## 2015-09-23 ENCOUNTER — Ambulatory Visit (INDEPENDENT_AMBULATORY_CARE_PROVIDER_SITE_OTHER): Payer: Medicare Other | Admitting: Family Medicine

## 2015-09-23 ENCOUNTER — Encounter: Payer: Self-pay | Admitting: Family Medicine

## 2015-09-23 VITALS — BP 130/76 | HR 96 | Temp 97.8°F | Resp 18 | Wt 193.0 lb

## 2015-09-23 DIAGNOSIS — L299 Pruritus, unspecified: Secondary | ICD-10-CM

## 2015-09-23 MED ORDER — CLONAZEPAM 0.5 MG PO TABS: 0.5000 mg | ORAL_TABLET | Freq: Three times a day (TID) | ORAL | Status: DC | PRN

## 2015-09-23 NOTE — Progress Notes (Signed)
Subjective:    [name], female    DOB: Mar 26, 1981, 35 y.o.   MRN: [id number]  HPI 08/23/15  patient has been itching all over her body for the last 2 weeks. There is not much of a rash. The rash that she shows me on her upper arms and on her chest are 1 mm excoriated red sores from where she has been scratching. Most of these are in linear groupings consistent with fingernail scratches. There is no actual original rash  On her chest other than the scratch marks. She also states that her husband has been scratching and itching recently. Rash is confined to lower than the neck. She denies any itching on her legs or in her genital area.  At that time, my plan was:  Physical exam is unremarkable. History concerns me about possible scabies exposure. I will treat the patient with Elimite cream. Apply head to toe and rinse off after 8 hours. I will also treat her husband. I recommended that they wash all her bed linens , etc.. Recheck in one week if no better  09/23/15 Patient treated herself twice for scabies with no improvement. She called back and I even tried her on prednisone with no improvement. She is here today complaining of diffuse itching all over her body. There is no visible rash on her skin either on her back on her arms on her chest or her legs. There are several areas of excoriation where she has been scratching but there is no visible abnormality in the skin Past Medical History  Diagnosis Date  . Hyperlipidemia   . Obesity   . Hypertension   . GERD (gastroesophageal reflux disease)   . Vitamin D deficiency   . Transaminase or LDH elevation 04/24/2009  . Chronic back pain   . 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 Fasting hyperglycemia 05/12/2012  . Heart murmur, systolic   . Erosive esophagitis   . Neuromuscular disorder (HCC)   . Depression   . Anxiety   . Stroke (HCC)     2008/ no deficits  .  Obstructive sleep apnea     CPAP- sleep study 8/14 EP[facility]   Past Surgical History  Procedure Laterality Date  . Foot surgery  05/2009    Dr Ramos(Left ankle)  . Esophageal manometry N/A 11/07/2012    Procedure: ESOPHAGEAL MANOMETRY (EM);  Surgeon: Mardella Layman, MD;  Location: [facility];  Service: Endoscopy;  Laterality: N/A;  . 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 Marital Status: Single    Spouse Name: N/A  . Number of Children: 1  . Years of Education: N/A   Occupational History  . Disabled     Social History Main Topics  . Smoking status: Never Smoker   . Smokeless tobacco: Never Used  . Alcohol Use: No  . Drug Use: No  . Sexual Activity: Yes   Other Topics Concern  . Not on file   Social History Narrative   Stopped caffeine       Review of Systems  All other systems reviewed and are negative.      Objective:   Physical Exam  Cardiovascular: Normal rate, regular rhythm and normal heart sounds.  Pulmonary/Chest: Effort normal and breath sounds normal. No respiratory distress. She has no wheezes. She has no rales.  Skin: Rash noted.  Vitals reviewed.         Assessment & Plan:  Pruritus - Plan: clonazePAM (KLONOPIN) 0.5 MG tablet, COMPLETE METABOLIC PANEL WITH GFR, CBC with Differential/Platelet obtain a CMP to evaluate for renal and hepatic problems. Obtain a CBC to evaluate for polycythemia. If lab work is normal, I believe this could be psychosomatic. I'll try the patient on Klonopin 0.5 mg by mouth 3 times a day. If itching improves, we'll need to try to find better ways to manage her anxiety long-term the Klonopin. If itching does not improve, I would obtain a second opinion from dermatology

## 2015-09-24 ENCOUNTER — Encounter: Payer: Self-pay | Admitting: Family Medicine

## 2015-09-24 LAB — COMPLETE METABOLIC PANEL WITH GFR
ALBUMIN: 3.7 g/dL (ref 3.6–5.1)
ALK PHOS: 85 U/L (ref 33–115)
ALT: 13 U/L (ref 6–29)
AST: 20 U/L (ref 10–30)
BILIRUBIN TOTAL: 0.2 mg/dL (ref 0.2–1.2)
BUN: 5 mg/dL — AB (ref 7–25)
CALCIUM: 8.9 mg/dL (ref 8.6–10.2)
CO2: 29 mmol/L (ref 20–31)
Chloride: 105 mmol/L (ref 98–110)
Creat: 0.64 mg/dL (ref 0.50–1.10)
GFR, Est African American: >89 mL/min (ref >=60)
GFR, Est Non African American: >89 mL/min (ref >=60)
GLUCOSE: 60 mg/dL — AB (ref 70–99)
POTASSIUM: 4.2 mmol/L (ref 3.5–5.3)
SODIUM: 142 mmol/L (ref 135–146)
TOTAL PROTEIN: 6.6 g/dL (ref 6.1–8.1)

## 2015-09-24 LAB — CBC WITH DIFFERENTIAL/PLATELET
BASOS PCT: 0 % (ref 0–1)
Basophils Absolute: 0 10*3/uL (ref 0.0–0.1)
EOS ABS: 0.4 10*3/uL (ref 0.0–0.7)
EOS PCT: 6 % — AB (ref 0–5)
HCT: 36 % (ref 36.0–46.0)
Hemoglobin: 11.3 g/dL — ABNORMAL LOW (ref 12.0–15.0)
Lymphocytes Relative: 41 % (ref 12–46)
Lymphs Abs: 2.5 10*3/uL (ref 0.7–4.0)
MCH: 28.7 pg (ref 26.0–34.0)
MCHC: 31.4 g/dL (ref 30.0–36.0)
MCV: 91.4 fL (ref 78.0–100.0)
MONO ABS: 0.5 10*3/uL (ref 0.1–1.0)
MONOS PCT: 8 % (ref 3–12)
MPV: 10.4 fL (ref 8.6–12.4)
NEUTROS ABS: 2.8 10*3/uL (ref 1.7–7.7)
Neutrophils Relative %: 45 % (ref 43–77)
PLATELETS: 412 10*3/uL — AB (ref 150–400)
RBC: 3.94 MIL/uL (ref 3.87–5.11)
RDW: 13.5 % (ref 11.5–15.5)
WBC: 6.2 10*3/uL (ref 4.0–10.5)

## 2015-09-26 ENCOUNTER — Encounter: Payer: Self-pay | Admitting: Family Medicine

## 2015-09-30 ENCOUNTER — Encounter: Payer: Self-pay | Admitting: Family Medicine

## 2015-10-02 ENCOUNTER — Encounter: Payer: Self-pay | Admitting: Family Medicine

## 2015-10-07 ENCOUNTER — Other Ambulatory Visit: Payer: Self-pay | Admitting: Family Medicine

## 2015-10-07 MED ORDER — PERMETHRIN 5 % EX CREA: 1.0000 "application " | TOPICAL_CREAM | Freq: Once | CUTANEOUS | Status: DC

## 2015-10-19 ENCOUNTER — Other Ambulatory Visit: Payer: Self-pay | Admitting: Family Medicine

## 2015-10-25 ENCOUNTER — Other Ambulatory Visit: Payer: Self-pay | Admitting: Family Medicine

## 2015-10-25 NOTE — Telephone Encounter (Signed)
ok 

## 2015-10-25 NOTE — Telephone Encounter (Signed)
rx called in

## 2015-10-25 NOTE — Telephone Encounter (Signed)
LRF 09/23/15 #60 + 1    LOV 09/23/15  OK refill??

## 2015-11-05 ENCOUNTER — Encounter: Payer: Self-pay | Admitting: Family Medicine

## 2015-11-19 ENCOUNTER — Other Ambulatory Visit: Payer: Self-pay | Admitting: Family Medicine

## 2015-11-20 ENCOUNTER — Other Ambulatory Visit: Payer: Self-pay | Admitting: Family Medicine

## 2015-11-20 NOTE — Telephone Encounter (Signed)
Ok to refill??  Last office visit 09/23/2015.  Last refill Xanax 08/27/2015, #2 refills.   Clonazepam is requested elsewhere (last refill 10/25/2015).

## 2015-11-21 ENCOUNTER — Other Ambulatory Visit: Payer: Self-pay | Admitting: *Deleted

## 2015-11-21 ENCOUNTER — Encounter: Payer: Self-pay | Admitting: Family Medicine

## 2015-11-21 ENCOUNTER — Telehealth: Payer: Self-pay | Admitting: *Deleted

## 2015-11-21 NOTE — Addendum Note (Signed)
Addended by: Phillips OdorSIX, CHRISTINA H on: 11/21/2015 10:33 AM   Modules accepted: Orders
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
## 2015-11-21 NOTE — Telephone Encounter (Signed)
Medication list updated.   Clonazepam called in.

## 2015-11-21 NOTE — Telephone Encounter (Signed)
Denied.  Can have clonazepam.  Cannot have xanax.  Redundant.  Remove xanax from med list.

## 2015-11-25 ENCOUNTER — Encounter: Payer: Self-pay | Admitting: *Deleted

## 2015-11-25 NOTE — Telephone Encounter (Signed)
Received PA determination.   PA approved through 08/09/2016.  Pharmacy and patient made aware.

## 2015-12-03 ENCOUNTER — Inpatient Hospital Stay (HOSPITAL_COMMUNITY): Payer: Medicare Other

## 2015-12-03 ENCOUNTER — Inpatient Hospital Stay (HOSPITAL_COMMUNITY)
Admission: EM | Admit: 2015-12-03 | Discharge: 2015-12-12 | DRG: 026 | Disposition: A | Discharge status: Rehabilitation Facility | Payer: Medicare Other | Attending: General Surgery | Admitting: General Surgery

## 2015-12-03 ENCOUNTER — Emergency Department (HOSPITAL_COMMUNITY): Payer: Medicare Other

## 2015-12-03 ENCOUNTER — Encounter (HOSPITAL_COMMUNITY): Payer: Self-pay | Admitting: Emergency Medicine

## 2015-12-03 ENCOUNTER — Inpatient Hospital Stay (HOSPITAL_COMMUNITY): Payer: Medicare Other | Admitting: Certified Registered"

## 2015-12-03 ENCOUNTER — Encounter (HOSPITAL_COMMUNITY): Admission: EM | Disposition: A | Discharge status: Rehabilitation Facility | Payer: Self-pay | Source: Home / Self Care

## 2015-12-03 DIAGNOSIS — R402142 Coma scale, eyes open, spontaneous, at arrival to emergency department: Secondary | ICD-10-CM | POA: Diagnosis present

## 2015-12-03 DIAGNOSIS — J9690 Respiratory failure, unspecified, unspecified whether with hypoxia or hypercapnia: Secondary | ICD-10-CM

## 2015-12-03 DIAGNOSIS — S066X0A Traumatic subarachnoid hemorrhage without loss of consciousness, initial encounter: Secondary | ICD-10-CM | POA: Diagnosis not present

## 2015-12-03 DIAGNOSIS — D62 Acute posthemorrhagic anemia: Secondary | ICD-10-CM | POA: Diagnosis not present

## 2015-12-03 DIAGNOSIS — S0291XA Unspecified fracture of skull, initial encounter for closed fracture: Secondary | ICD-10-CM | POA: Diagnosis present

## 2015-12-03 DIAGNOSIS — G8191 Hemiplegia, unspecified affecting right dominant side: Secondary | ICD-10-CM | POA: Diagnosis present

## 2015-12-03 DIAGNOSIS — M6249 Contracture of muscle, multiple sites: Secondary | ICD-10-CM | POA: Diagnosis not present

## 2015-12-03 DIAGNOSIS — R402362 Coma scale, best motor response, obeys commands, at arrival to emergency department: Secondary | ICD-10-CM | POA: Diagnosis not present

## 2015-12-03 DIAGNOSIS — R1310 Dysphagia, unspecified: Secondary | ICD-10-CM | POA: Insufficient documentation

## 2015-12-03 DIAGNOSIS — G253 Myoclonus: Secondary | ICD-10-CM | POA: Diagnosis not present

## 2015-12-03 DIAGNOSIS — E876 Hypokalemia: Secondary | ICD-10-CM | POA: Diagnosis not present

## 2015-12-03 DIAGNOSIS — R4701 Aphasia: Secondary | ICD-10-CM | POA: Diagnosis present

## 2015-12-03 DIAGNOSIS — T1490 Injury, unspecified: Secondary | ICD-10-CM | POA: Diagnosis present

## 2015-12-03 DIAGNOSIS — I609 Nontraumatic subarachnoid hemorrhage, unspecified: Secondary | ICD-10-CM

## 2015-12-03 DIAGNOSIS — W3400XA Accidental discharge from unspecified firearms or gun, initial encounter: Secondary | ICD-10-CM

## 2015-12-03 DIAGNOSIS — S069X3S Unspecified intracranial injury with loss of consciousness of 1 hour to 5 hours 59 minutes, sequela: Secondary | ICD-10-CM | POA: Diagnosis not present

## 2015-12-03 DIAGNOSIS — S066X9D Traumatic subarachnoid hemorrhage with loss of consciousness of unspecified duration, subsequent encounter: Secondary | ICD-10-CM | POA: Diagnosis not present

## 2015-12-03 DIAGNOSIS — S0100XD Unspecified open wound of scalp, subsequent encounter: Secondary | ICD-10-CM | POA: Diagnosis not present

## 2015-12-03 DIAGNOSIS — S069X2S Unspecified intracranial injury with loss of consciousness of 31 minutes to 59 minutes, sequela: Secondary | ICD-10-CM | POA: Diagnosis not present

## 2015-12-03 DIAGNOSIS — T85598A Other mechanical complication of other gastrointestinal prosthetic devices, implants and grafts, initial encounter: Secondary | ICD-10-CM

## 2015-12-03 DIAGNOSIS — S020XXB Fracture of vault of skull, initial encounter for open fracture: Secondary | ICD-10-CM | POA: Diagnosis present

## 2015-12-03 DIAGNOSIS — S065X0A Traumatic subdural hemorrhage without loss of consciousness, initial encounter: Secondary | ICD-10-CM | POA: Diagnosis present

## 2015-12-03 DIAGNOSIS — F4321 Adjustment disorder with depressed mood: Secondary | ICD-10-CM | POA: Diagnosis not present

## 2015-12-03 DIAGNOSIS — Z4659 Encounter for fitting and adjustment of other gastrointestinal appliance and device: Secondary | ICD-10-CM

## 2015-12-03 DIAGNOSIS — S065X9A Traumatic subdural hemorrhage with loss of consciousness of unspecified duration, initial encounter: Secondary | ICD-10-CM

## 2015-12-03 DIAGNOSIS — R402212 Coma scale, best verbal response, none, at arrival to emergency department: Secondary | ICD-10-CM | POA: Diagnosis not present

## 2015-12-03 DIAGNOSIS — I6200 Nontraumatic subdural hemorrhage, unspecified: Secondary | ICD-10-CM | POA: Diagnosis not present

## 2015-12-03 DIAGNOSIS — Y22XXXA Handgun discharge, undetermined intent, initial encounter: Secondary | ICD-10-CM | POA: Diagnosis not present

## 2015-12-03 DIAGNOSIS — R609 Edema, unspecified: Secondary | ICD-10-CM | POA: Diagnosis not present

## 2015-12-03 DIAGNOSIS — S069X3A Unspecified intracranial injury with loss of consciousness of 1 hour to 5 hours 59 minutes, initial encounter: Secondary | ICD-10-CM | POA: Diagnosis present

## 2015-12-03 DIAGNOSIS — G44321 Chronic post-traumatic headache, intractable: Secondary | ICD-10-CM | POA: Diagnosis not present

## 2015-12-03 DIAGNOSIS — S065XAA Traumatic subdural hemorrhage with loss of consciousness status unknown, initial encounter: Secondary | ICD-10-CM | POA: Insufficient documentation

## 2015-12-03 DIAGNOSIS — G8111 Spastic hemiplegia affecting right dominant side: Secondary | ICD-10-CM | POA: Diagnosis not present

## 2015-12-03 DIAGNOSIS — R739 Hyperglycemia, unspecified: Secondary | ICD-10-CM | POA: Insufficient documentation

## 2015-12-03 DIAGNOSIS — S06339D Contusion and laceration of cerebrum, unspecified, with loss of consciousness of unspecified duration, subsequent encounter: Secondary | ICD-10-CM | POA: Diagnosis not present

## 2015-12-03 DIAGNOSIS — I10 Essential (primary) hypertension: Secondary | ICD-10-CM | POA: Insufficient documentation

## 2015-12-03 DIAGNOSIS — S069X9D Unspecified intracranial injury with loss of consciousness of unspecified duration, subsequent encounter: Secondary | ICD-10-CM | POA: Diagnosis not present

## 2015-12-03 DIAGNOSIS — Z01818 Encounter for other preprocedural examination: Secondary | ICD-10-CM

## 2015-12-03 DIAGNOSIS — T1490XA Injury, unspecified, initial encounter: Secondary | ICD-10-CM

## 2015-12-03 DIAGNOSIS — T148 Other injury of unspecified body region: Secondary | ICD-10-CM | POA: Diagnosis not present

## 2015-12-03 HISTORY — PX: CRANIOPLASTY: SHX1407

## 2015-12-03 LAB — POCT I-STAT 7, (LYTES, BLD GAS, ICA,H+H)
Acid-base deficit: 6 mmol/L — ABNORMAL HIGH (ref 0.0–2.0)
BICARBONATE: 20.1 meq/L (ref 20.0–24.0)
Calcium, Ion: 0.98 mmol/L — ABNORMAL LOW (ref 1.12–1.23)
HCT: 26 % — ABNORMAL LOW (ref 36.0–46.0)
Hemoglobin: 8.8 g/dL — ABNORMAL LOW (ref 12.0–15.0)
O2 Saturation: 100 %
PCO2 ART: 40.1 mmHg (ref 35.0–45.0)
PO2 ART: 575 mmHg — AB (ref 80.0–100.0)
Potassium: 4.9 mmol/L (ref 3.5–5.1)
Sodium: 139 mmol/L (ref 135–145)
TCO2: 21 mmol/L (ref 0–100)
pH, Arterial: 7.308 — ABNORMAL LOW (ref 7.350–7.450)

## 2015-12-03 LAB — PREPARE FRESH FROZEN PLASMA
UNIT DIVISION: 0
UNIT DIVISION: 0

## 2015-12-03 LAB — BASIC METABOLIC PANEL
ANION GAP: 7 (ref 5–15)
BUN: 7 mg/dL (ref 6–20)
CHLORIDE: 111 mmol/L (ref 101–111)
CO2: 18 mmol/L — AB (ref 22–32)
CREATININE: 0.71 mg/dL (ref 0.44–1.00)
Calcium: 6.7 mg/dL — ABNORMAL LOW (ref 8.9–10.3)
GFR calc non Af Amer: >60 mL/min (ref >60)
Glucose, Bld: 166 mg/dL — ABNORMAL HIGH (ref 65–99)
POTASSIUM: 4.1 mmol/L (ref 3.5–5.1)
SODIUM: 136 mmol/L (ref 135–145)

## 2015-12-03 LAB — COMPREHENSIVE METABOLIC PANEL
ALK PHOS: 53 U/L (ref 38–126)
ALT: 11 U/L — AB (ref 14–54)
ANION GAP: 6 (ref 5–15)
AST: 16 U/L (ref 15–41)
Albumin: 2 g/dL — ABNORMAL LOW (ref 3.5–5.0)
BUN: 8 mg/dL (ref 6–20)
CALCIUM: 6.6 mg/dL — AB (ref 8.9–10.3)
CO2: 20 mmol/L — ABNORMAL LOW (ref 22–32)
CREATININE: 0.72 mg/dL (ref 0.44–1.00)
Chloride: 112 mmol/L — ABNORMAL HIGH (ref 101–111)
Glucose, Bld: 129 mg/dL — ABNORMAL HIGH (ref 65–99)
Potassium: 4.4 mmol/L (ref 3.5–5.1)
Sodium: 138 mmol/L (ref 135–145)
TOTAL PROTEIN: 4.1 g/dL — AB (ref 6.5–8.1)
Total Bilirubin: 0.3 mg/dL (ref 0.3–1.2)

## 2015-12-03 LAB — CBC
HCT: 21.7 % — ABNORMAL LOW (ref 36.0–46.0)
HCT: 27.9 % — ABNORMAL LOW (ref 36.0–46.0)
HEMATOCRIT: 22.3 % — AB (ref 36.0–46.0)
HEMOGLOBIN: 6.7 g/dL — AB (ref 12.0–15.0)
Hemoglobin: 7 g/dL — ABNORMAL LOW (ref 12.0–15.0)
Hemoglobin: 9.1 g/dL — ABNORMAL LOW (ref 12.0–15.0)
MCH: 28.3 pg (ref 26.0–34.0)
MCH: 28.7 pg (ref 26.0–34.0)
MCH: 28.6 pg (ref 26.0–34.0)
MCHC: 30.9 g/dL (ref 30.0–36.0)
MCHC: 31.4 g/dL (ref 30.0–36.0)
MCHC: 32.6 g/dL (ref 30.0–36.0)
MCV: 91.6 fL (ref 78.0–100.0)
MCV: 91.4 fL (ref 78.0–100.0)
MCV: 87.7 fL (ref 78.0–100.0)
PLATELETS: 226 10*3/uL (ref 150–400)
PLATELETS: 261 10*3/uL (ref 150–400)
PLATELETS: 237 10*3/uL (ref 150–400)
RBC: 2.37 MIL/uL — AB (ref 3.87–5.11)
RBC: 2.44 MIL/uL — ABNORMAL LOW (ref 3.87–5.11)
RBC: 3.18 MIL/uL — ABNORMAL LOW (ref 3.87–5.11)
RDW: 12.5 % (ref 11.5–15.5)
RDW: 12.5 % (ref 11.5–15.5)
RDW: 13.9 % (ref 11.5–15.5)
WBC: 11.9 10*3/uL — AB (ref 4.0–10.5)
WBC: 12.7 10*3/uL — AB (ref 4.0–10.5)
WBC: 15.4 10*3/uL — AB (ref 4.0–10.5)

## 2015-12-03 LAB — URINALYSIS, ROUTINE W REFLEX MICROSCOPIC
BILIRUBIN URINE: NEGATIVE
Glucose, UA: NEGATIVE mg/dL
Hgb urine dipstick: NEGATIVE
Ketones, ur: NEGATIVE mg/dL
LEUKOCYTES UA: NEGATIVE
NITRITE: NEGATIVE
PH: 5 (ref 5.0–8.0)
Protein, ur: NEGATIVE mg/dL
SPECIFIC GRAVITY, URINE: 1.025 (ref 1.005–1.030)

## 2015-12-03 LAB — PREPARE RBC (CROSSMATCH)

## 2015-12-03 LAB — PREGNANCY, URINE: Preg Test, Ur: NEGATIVE

## 2015-12-03 LAB — PROTIME-INR
INR: 1.37 (ref 0.00–1.49)
PROTHROMBIN TIME: 17 s — AB (ref 11.6–15.2)

## 2015-12-03 LAB — CDS SEROLOGY

## 2015-12-03 LAB — MRSA PCR SCREENING: MRSA BY PCR: NEGATIVE

## 2015-12-03 LAB — ETHANOL

## 2015-12-03 LAB — TRIGLYCERIDES: Triglycerides: 100 mg/dL (ref <150)

## 2015-12-03 LAB — BLOOD PRODUCT ORDER (VERBAL) VERIFICATION

## 2015-12-03 LAB — ABO/RH: ABO/RH(D): B POS

## 2015-12-03 SURGERY — CRANIOPLASTY
Anesthesia: General | Site: Head

## 2015-12-03 MED ORDER — SODIUM CHLORIDE 0.9 % IV SOLN
INTRAVENOUS | Status: AC | PRN
  Administered 2015-12-03 (×2): 1000 mL via INTRAVENOUS

## 2015-12-03 MED ORDER — DEXTROSE-NACL 5-0.9 % IV SOLN
INTRAVENOUS | Status: DC
  Administered 2015-12-03: 04:00:00 via INTRAVENOUS

## 2015-12-03 MED ORDER — CEFAZOLIN SODIUM-DEXTROSE 2-4 GM/100ML-% IV SOLN
INTRAVENOUS | Status: AC
  Filled 2015-12-03: qty 100

## 2015-12-03 MED ORDER — HYDROMORPHONE HCL 1 MG/ML IJ SOLN
1.0000 mg | INTRAMUSCULAR | Status: DC | PRN
  Administered 2015-12-03: 1 mg via INTRAVENOUS

## 2015-12-03 MED ORDER — ACETAMINOPHEN 650 MG RE SUPP
650.0000 mg | RECTAL | Status: DC | PRN
  Administered 2015-12-05 – 2015-12-10 (×2): 650 mg via RECTAL
  Filled 2015-12-03 (×2): qty 1

## 2015-12-03 MED ORDER — ONDANSETRON HCL 4 MG/2ML IJ SOLN: 4.0000 mg | INTRAMUSCULAR | Status: DC | PRN

## 2015-12-03 MED ORDER — ANTISEPTIC ORAL RINSE SOLUTION (CORINZ)
7.0000 mL | OROMUCOSAL | Status: DC
  Administered 2015-12-03 – 2015-12-05 (×19): 7 mL via OROMUCOSAL

## 2015-12-03 MED ORDER — FENTANYL CITRATE (PF) 100 MCG/2ML IJ SOLN
100.0000 ug | INTRAMUSCULAR | Status: DC | PRN
  Administered 2015-12-03 – 2015-12-04 (×6): 100 ug via INTRAVENOUS
  Filled 2015-12-03 (×3): qty 2

## 2015-12-03 MED ORDER — SODIUM CHLORIDE 0.9 % IV SOLN
INTRAVENOUS | Status: DC | PRN
  Administered 2015-12-03: 06:00:00 via INTRAVENOUS

## 2015-12-03 MED ORDER — CEFAZOLIN SODIUM-DEXTROSE 2-4 GM/100ML-% IV SOLN
2.0000 g | Freq: Three times a day (TID) | INTRAVENOUS | Status: AC
  Administered 2015-12-03 (×2): 2 g via INTRAVENOUS
  Filled 2015-12-03 (×3): qty 100

## 2015-12-03 MED ORDER — POTASSIUM CHLORIDE IN NACL 20-0.9 MEQ/L-% IV SOLN
INTRAVENOUS | Status: DC
  Administered 2015-12-03 – 2015-12-06 (×8): via INTRAVENOUS
  Administered 2015-12-06: 1000 mL via INTRAVENOUS
  Administered 2015-12-06: 08:00:00 via INTRAVENOUS
  Administered 2015-12-07: 1000 mL via INTRAVENOUS
  Administered 2015-12-07: 15:00:00 via INTRAVENOUS
  Administered 2015-12-07: 1000 mL via INTRAVENOUS
  Administered 2015-12-08: 125 mL via INTRAVENOUS
  Filled 2015-12-03 (×20): qty 1000

## 2015-12-03 MED ORDER — ONDANSETRON HCL 4 MG PO TABS: 4.0000 mg | ORAL_TABLET | Freq: Four times a day (QID) | ORAL | Status: DC | PRN

## 2015-12-03 MED ORDER — PROPOFOL 1000 MG/100ML IV EMUL
0.0000 ug/kg/min | INTRAVENOUS | Status: DC
  Administered 2015-12-03: 5 ug/kg/min via INTRAVENOUS
  Administered 2015-12-03: 30 ug/kg/min via INTRAVENOUS
  Administered 2015-12-04: 10 ug/kg/min via INTRAVENOUS
  Administered 2015-12-04: 15 ug/kg/min via INTRAVENOUS
  Administered 2015-12-04: 35 ug/kg/min via INTRAVENOUS
  Administered 2015-12-05: 15 ug/kg/min via INTRAVENOUS
  Filled 2015-12-03 (×6): qty 100

## 2015-12-03 MED ORDER — HYDROMORPHONE HCL 1 MG/ML IJ SOLN
INTRAMUSCULAR | Status: AC
  Filled 2015-12-03: qty 1

## 2015-12-03 MED ORDER — PROMETHAZINE HCL 25 MG PO TABS: 12.5000 mg | ORAL_TABLET | ORAL | Status: DC | PRN

## 2015-12-03 MED ORDER — PROPOFOL 10 MG/ML IV BOLUS
INTRAVENOUS | Status: AC
  Filled 2015-12-03: qty 20

## 2015-12-03 MED ORDER — LORAZEPAM 2 MG/ML IJ SOLN
INTRAMUSCULAR | Status: AC
  Filled 2015-12-03: qty 1

## 2015-12-03 MED ORDER — LABETALOL HCL 5 MG/ML IV SOLN
10.0000 mg | INTRAVENOUS | Status: DC | PRN
  Administered 2015-12-03: 10 mg via INTRAVENOUS
  Filled 2015-12-03: qty 4

## 2015-12-03 MED ORDER — ONDANSETRON HCL 4 MG PO TABS: 4.0000 mg | ORAL_TABLET | ORAL | Status: DC | PRN

## 2015-12-03 MED ORDER — HYDROMORPHONE HCL 1 MG/ML IJ SOLN: 0.5000 mg | INTRAMUSCULAR | Status: DC | PRN

## 2015-12-03 MED ORDER — SODIUM CHLORIDE 0.9 % IV SOLN: 10.0000 mL/h | Freq: Once | INTRAVENOUS | Status: DC

## 2015-12-03 MED ORDER — TETANUS-DIPHTH-ACELL PERTUSSIS 5-2.5-18.5 LF-MCG/0.5 IM SUSP
INTRAMUSCULAR | Status: AC
  Filled 2015-12-03: qty 0.5

## 2015-12-03 MED ORDER — THROMBIN 5000 UNITS EX SOLR
OROMUCOSAL | Status: DC | PRN
  Administered 2015-12-03: 5 mL via TOPICAL

## 2015-12-03 MED ORDER — FENTANYL CITRATE (PF) 100 MCG/2ML IJ SOLN
INTRAMUSCULAR | Status: DC | PRN
  Administered 2015-12-03 (×2): 50 ug via INTRAVENOUS

## 2015-12-03 MED ORDER — TETANUS-DIPHTH-ACELL PERTUSSIS 5-2.5-18.5 LF-MCG/0.5 IM SUSP
0.5000 mL | Freq: Once | INTRAMUSCULAR | Status: AC
  Administered 2015-12-03: 0.5 mL via INTRAMUSCULAR

## 2015-12-03 MED ORDER — CEFAZOLIN SODIUM-DEXTROSE 2-3 GM-% IV SOLR
INTRAVENOUS | Status: DC | PRN
  Administered 2015-12-03: 2 g via INTRAVENOUS

## 2015-12-03 MED ORDER — LEVETIRACETAM 500 MG/5ML IV SOLN
1000.0000 mg | Freq: Once | INTRAVENOUS | Status: AC
  Administered 2015-12-03: 1000 mg via INTRAVENOUS
  Filled 2015-12-03: qty 10

## 2015-12-03 MED ORDER — PANTOPRAZOLE SODIUM 40 MG IV SOLR
40.0000 mg | Freq: Every day | INTRAVENOUS | Status: DC
  Administered 2015-12-03 – 2015-12-07 (×5): 40 mg via INTRAVENOUS
  Filled 2015-12-03 (×5): qty 40

## 2015-12-03 MED ORDER — LORAZEPAM 2 MG/ML IJ SOLN
1.0000 mg | Freq: Once | INTRAMUSCULAR | Status: AC
  Administered 2015-12-03: 1 mg via INTRAVENOUS

## 2015-12-03 MED ORDER — 0.9 % SODIUM CHLORIDE (POUR BTL) OPTIME
TOPICAL | Status: DC | PRN
  Administered 2015-12-03 (×2): 1000 mL

## 2015-12-03 MED ORDER — IOPAMIDOL (ISOVUE-370) INJECTION 76%
INTRAVENOUS | Status: AC
  Administered 2015-12-03: 50 mL
  Filled 2015-12-03: qty 50

## 2015-12-03 MED ORDER — FENTANYL CITRATE (PF) 100 MCG/2ML IJ SOLN
100.0000 ug | INTRAMUSCULAR | Status: DC | PRN
  Administered 2015-12-03: 50 ug via INTRAVENOUS
  Administered 2015-12-03: 100 ug via INTRAVENOUS
  Filled 2015-12-03 (×6): qty 2

## 2015-12-03 MED ORDER — SODIUM CHLORIDE 0.9 % IR SOLN
Status: DC | PRN
  Administered 2015-12-03: 500 mL

## 2015-12-03 MED ORDER — SUCCINYLCHOLINE CHLORIDE 20 MG/ML IJ SOLN
INTRAMUSCULAR | Status: DC | PRN
  Administered 2015-12-03: 120 mg via INTRAVENOUS

## 2015-12-03 MED ORDER — ROCURONIUM BROMIDE 100 MG/10ML IV SOLN
INTRAVENOUS | Status: DC | PRN
  Administered 2015-12-03: 50 mg via INTRAVENOUS

## 2015-12-03 MED ORDER — CEFAZOLIN SODIUM 1 G IJ SOLR
INTRAMUSCULAR | Status: AC
  Filled 2015-12-03: qty 20

## 2015-12-03 MED ORDER — PHENYLEPHRINE HCL 10 MG/ML IJ SOLN
INTRAMUSCULAR | Status: DC | PRN
  Administered 2015-12-03 (×2): 160 ug via INTRAVENOUS

## 2015-12-03 MED ORDER — CEFAZOLIN SODIUM-DEXTROSE 2-4 GM/100ML-% IV SOLN
2.0000 g | Freq: Once | INTRAVENOUS | Status: AC
  Administered 2015-12-03: 2 g via INTRAVENOUS

## 2015-12-03 MED ORDER — SODIUM CHLORIDE 0.9 % IV SOLN
500.0000 mg | Freq: Two times a day (BID) | INTRAVENOUS | Status: DC
  Administered 2015-12-03 – 2015-12-10 (×16): 500 mg via INTRAVENOUS
  Filled 2015-12-03 (×19): qty 5

## 2015-12-03 MED ORDER — ONDANSETRON HCL 4 MG/2ML IJ SOLN
4.0000 mg | Freq: Four times a day (QID) | INTRAMUSCULAR | Status: DC | PRN
  Administered 2015-12-03: 4 mg via INTRAVENOUS

## 2015-12-03 MED ORDER — FENTANYL CITRATE (PF) 250 MCG/5ML IJ SOLN
INTRAMUSCULAR | Status: AC
  Filled 2015-12-03: qty 5

## 2015-12-03 MED ORDER — PROPOFOL 10 MG/ML IV BOLUS
INTRAVENOUS | Status: DC | PRN
  Administered 2015-12-03: 120 mg via INTRAVENOUS

## 2015-12-03 MED ORDER — ONDANSETRON HCL 4 MG/2ML IJ SOLN
INTRAMUSCULAR | Status: AC
  Filled 2015-12-03: qty 2

## 2015-12-03 MED ORDER — ACETAMINOPHEN 160 MG/5ML PO SOLN: 650.0000 mg | Freq: Four times a day (QID) | ORAL | Status: DC | PRN

## 2015-12-03 MED ORDER — ACETAMINOPHEN 325 MG PO TABS: 650.0000 mg | ORAL_TABLET | ORAL | Status: DC | PRN

## 2015-12-03 MED ORDER — LIDOCAINE HCL (CARDIAC) 20 MG/ML IV SOLN
INTRAVENOUS | Status: DC | PRN
  Administered 2015-12-03: 80 mg via INTRAVENOUS

## 2015-12-03 MED ORDER — CHLORHEXIDINE GLUCONATE 0.12% ORAL RINSE (MEDLINE KIT)
15.0000 mL | Freq: Two times a day (BID) | OROMUCOSAL | Status: DC
  Administered 2015-12-03 – 2015-12-05 (×5): 15 mL via OROMUCOSAL

## 2015-12-03 MED ORDER — THROMBIN 20000 UNITS EX SOLR
CUTANEOUS | Status: DC | PRN
  Administered 2015-12-03: 20 mL via TOPICAL

## 2015-12-03 SURGICAL SUPPLY — 55 items
ADH SKN CLS APL DERMABOND .7 (GAUZE/BANDAGES/DRESSINGS)
BAG DECANTER FOR FLEXI CONT (MISCELLANEOUS) ×3 IMPLANT
BNDG COHESIVE 4X5 TAN NS LF (GAUZE/BANDAGES/DRESSINGS) IMPLANT
BOWL SPATULA (MISCELLANEOUS) ×3 IMPLANT
BRUSH SCRUB EZ 1% IODOPHOR (MISCELLANEOUS) IMPLANT
BRUSH SCRUB EZ PLAIN DRY (MISCELLANEOUS) ×3 IMPLANT
BUR ACORN 6.0 PRECISION (BURR) IMPLANT
BUR ACORN 6.0MM PRECISION (BURR)
CANISTER SUCT 3000ML PPV (MISCELLANEOUS) ×3 IMPLANT
CORDS BIPOLAR (ELECTRODE) ×3 IMPLANT
DERMABOND ADVANCED (GAUZE/BANDAGES/DRESSINGS)
DERMABOND ADVANCED .7 DNX12 (GAUZE/BANDAGES/DRESSINGS) IMPLANT
DRAPE NEUROLOGICAL W/INCISE (DRAPES) ×3 IMPLANT
DRAPE SURG 17X23 STRL (DRAPES) IMPLANT
DRAPE WARM FLUID 44X44 (DRAPE) ×3 IMPLANT
DURAPREP 6ML APPLICATOR 50/CS (WOUND CARE) ×1 IMPLANT
ELECT CAUTERY BLADE 6.4 (BLADE) ×3 IMPLANT
ELECT REM PT RETURN 9FT ADLT (ELECTROSURGICAL) ×3
ELECTRODE REM PT RTRN 9FT ADLT (ELECTROSURGICAL) ×1 IMPLANT
GAUZE SPONGE 4X4 12PLY STRL (GAUZE/BANDAGES/DRESSINGS) ×3 IMPLANT
GAUZE SPONGE 4X4 16PLY XRAY LF (GAUZE/BANDAGES/DRESSINGS) ×3 IMPLANT
GLOVE BIO SURGEON STRL SZ7 (GLOVE) ×4 IMPLANT
GLOVE BIO SURGEON STRL SZ7.5 (GLOVE) ×2 IMPLANT
GLOVE BIO SURGEON STRL SZ8 (GLOVE) ×5 IMPLANT
GLOVE INDICATOR 7.5 STRL GRN (GLOVE) ×2 IMPLANT
GOWN STRL REUS W/ TWL LRG LVL3 (GOWN DISPOSABLE) IMPLANT
GOWN STRL REUS W/ TWL XL LVL3 (GOWN DISPOSABLE) IMPLANT
GOWN STRL REUS W/TWL 2XL LVL3 (GOWN DISPOSABLE) ×3 IMPLANT
GOWN STRL REUS W/TWL LRG LVL3 (GOWN DISPOSABLE)
GOWN STRL REUS W/TWL XL LVL3 (GOWN DISPOSABLE)
KIT BASIN OR (CUSTOM PROCEDURE TRAY) ×3 IMPLANT
KIT ROOM TURNOVER OR (KITS) ×3 IMPLANT
NDL HYPO 25X1 1.5 SAFETY (NEEDLE) ×1 IMPLANT
NEEDLE HYPO 25X1 1.5 SAFETY (NEEDLE) ×3 IMPLANT
NS IRRIG 1000ML POUR BTL (IV SOLUTION) ×3 IMPLANT
PACK LAMINECTOMY NEURO (CUSTOM PROCEDURE TRAY) ×3 IMPLANT
PAD ARMBOARD 7.5X6 YLW CONV (MISCELLANEOUS) ×9 IMPLANT
PIN MAYFIELD SKULL DISP (PIN) IMPLANT
PLATE 1.5  2HOLE LNG NEURO (Plate) ×8 IMPLANT
PLATE 1.5 2HOLE LNG NEURO (Plate) IMPLANT
SCREW SELF DRILL HT 1.5/4MM (Screw) ×16 IMPLANT
SET CRAINOPLASTY (SET/KITS/TRAYS/PACK) ×3 IMPLANT
STAPLER SKIN PROX WIDE 3.9 (STAPLE) ×2 IMPLANT
STAPLER VISISTAT 35W (STAPLE) ×1 IMPLANT
STOCKINETTE 6  STRL (DRAPES)
STOCKINETTE 6 STRL (DRAPES) IMPLANT
SUT ETHILON 3 0 FSL (SUTURE) ×4 IMPLANT
SUT VIC AB 2-0 CT2 18 VCP726D (SUTURE) ×3 IMPLANT
SYR CONTROL 10ML LL (SYRINGE) ×3 IMPLANT
TAPE CLOTH SURG 4X10 WHT LF (GAUZE/BANDAGES/DRESSINGS) ×2 IMPLANT
TOWEL OR 17X24 6PK STRL BLUE (TOWEL DISPOSABLE) ×3 IMPLANT
TOWEL OR 17X26 10 PK STRL BLUE (TOWEL DISPOSABLE) ×3 IMPLANT
TRAY FOLEY W/METER SILVER 14FR (SET/KITS/TRAYS/PACK) IMPLANT
UNDERPAD 30X30 INCONTINENT (UNDERPADS AND DIAPERS) IMPLANT
WATER STERILE IRR 1000ML POUR (IV SOLUTION) ×3 IMPLANT
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
## 2015-12-03 NOTE — ED Notes (Signed)
[place] [facility], on their way at this time.
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
## 2015-12-03 NOTE — ED Notes (Signed)
Emergency release blood arrives to Trauma Room B
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
## 2015-12-03 NOTE — ED Notes (Signed)
Multiple attempts for blood by RNs and MDs.  Unsuccessful.
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
## 2015-12-03 NOTE — Anesthesia Procedure Notes (Signed)
Procedure Name: Intubation Date/Time: 12/03/2015 6:03 AM Performed by: Arlice ColtMANESS, Kamdyn Colborn B Pre-anesthesia Checklist: Patient identified, Emergency Drugs available, Suction available, Patient being monitored and Timeout performed Patient Re-evaluated:Patient Re-evaluated prior to inductionOxygen Delivery Method: Circle system utilized Preoxygenation: Pre-oxygenation with 100% oxygen Intubation Type: IV induction and Rapid sequence Laryngoscope Size: Mac and 3 Grade View: Grade I Tube type: Subglottic suction tube Tube size: 7.0 mm Number of attempts: 1 Airway Equipment and Method: Stylet Placement Confirmation: ETT inserted through vocal cords under direct vision,  positive ETCO2 and breath sounds checked- equal and bilateral Secured at: 22 cm Tube secured with: Tape Dental Injury: Teeth and Oropharynx as per pre-operative assessment

## 2015-12-03 NOTE — Addendum Note (Signed)
Addendum  created 12/03/15 [id number] by Edmonia CaprioAmanda M Tricia [place], CRNA   Modules edited: Anesthesia Medication Administration
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
## 2015-12-03 NOTE — Progress Notes (Signed)
Per Dr. Yetta BarreJones, may wean to extubate per trauma MD.
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
## 2015-12-03 NOTE — ED Notes (Addendum)
To CT with RN, EMT and Trauma MD.
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
## 2015-12-04 ENCOUNTER — Inpatient Hospital Stay (HOSPITAL_COMMUNITY): Payer: Medicare Other

## 2015-12-04 LAB — POCT I-STAT 7, (LYTES, BLD GAS, ICA,H+H)
ACID-BASE DEFICIT: 3 mmol/L — AB (ref 0.0–2.0)
Bicarbonate: 21 meq/L (ref 20.0–24.0)
CALCIUM ION: 1.12 mmol/L (ref 1.12–1.23)
HEMATOCRIT: 21 % — AB (ref 36.0–46.0)
HEMOGLOBIN: 7.1 g/dL — AB (ref 12.0–15.0)
O2 SAT: 98 %
POTASSIUM: 3.7 mmol/L (ref 3.5–5.1)
Sodium: 137 mmol/L (ref 135–145)
TCO2: 22 mmol/L (ref 0–100)
pCO2 arterial: 33.6 mmHg — ABNORMAL LOW (ref 35.0–45.0)
pH, Arterial: 7.403 (ref 7.350–7.450)
pO2, Arterial: 107 mmHg — ABNORMAL HIGH (ref 80.0–100.0)

## 2015-12-04 LAB — BASIC METABOLIC PANEL
ANION GAP: 8 (ref 5–15)
BUN: <5 mg/dL — ABNORMAL LOW (ref 6–20)
CO2: 19 mmol/L — ABNORMAL LOW (ref 22–32)
Calcium: 7.5 mg/dL — ABNORMAL LOW (ref 8.9–10.3)
Chloride: 110 mmol/L (ref 101–111)
Creatinine, Ser: 0.61 mg/dL (ref 0.44–1.00)
GFR calc Af Amer: >60 mL/min (ref >60)
Glucose, Bld: 131 mg/dL — ABNORMAL HIGH (ref 65–99)
POTASSIUM: 4.3 mmol/L (ref 3.5–5.1)
SODIUM: 137 mmol/L (ref 135–145)

## 2015-12-04 LAB — CBC
HEMATOCRIT: 25 % — AB (ref 36.0–46.0)
HEMOGLOBIN: 8.3 g/dL — AB (ref 12.0–15.0)
MCH: 29.1 pg (ref 26.0–34.0)
MCHC: 33.2 g/dL (ref 30.0–36.0)
MCV: 87.7 fL (ref 78.0–100.0)
Platelets: 248 10*3/uL (ref 150–400)
RBC: 2.85 MIL/uL — ABNORMAL LOW (ref 3.87–5.11)
RDW: 14.2 % (ref 11.5–15.5)
WBC: 12.4 10*3/uL — AB (ref 4.0–10.5)

## 2015-12-04 MED ORDER — FENTANYL CITRATE (PF) 100 MCG/2ML IJ SOLN
50.0000 ug | INTRAMUSCULAR | Status: DC | PRN
  Administered 2015-12-04: 50 ug via INTRAVENOUS
  Administered 2015-12-04: 100 ug via INTRAVENOUS
  Administered 2015-12-04: 50 ug via INTRAVENOUS
  Administered 2015-12-04: 100 ug via INTRAVENOUS
  Administered 2015-12-05: 50 ug via INTRAVENOUS
  Filled 2015-12-04 (×5): qty 2

## 2015-12-04 NOTE — Progress Notes (Signed)
Follow up - Trauma and Critical Care  Patient Details:    Sara [name] is an 35 y.o. female.  Lines/tubes : Airway 7 mm (Active)  Secured at (cm) 23 cm 12/04/2015  8:13 AM  Measured From Lips 12/04/2015  8:13 AM  Secured Location Left 12/04/2015  8:13 AM  Secured By [employer] Tube Holder 12/04/2015  8:13 AM  Tube Holder Repositioned Yes 12/04/2015  8:13 AM  Cuff Pressure (cm H2O) 22 cm H2O 12/04/2015  8:13 AM  Site Condition Dry 12/04/2015  3:04 AM     Arterial Line 12/03/15 Right Radial (Active)  Site Assessment Clean;Dry;Intact 12/03/2015  8:00 PM  Line Status Pulsatile blood flow 12/03/2015  8:00 PM  Art Line Waveform Whip 12/03/2015  8:00 PM  Art Line Interventions Zeroed and calibrated;Leveled;Connections checked and tightened;Flushed per protocol 12/03/2015  8:00 PM  Color/Movement/Sensation Capillary refill less than 3 sec 12/03/2015  8:00 PM  Dressing Type Transparent;Occlusive 12/03/2015  8:00 PM  Dressing Status Clean;Dry;Intact;Antimicrobial disc in place 12/03/2015  8:00 PM  Dressing Change Due 12/10/15 12/03/2015  8:00 PM     Closed System Drain 1 Left Other (Comment) Bulb (JP) (Active)  Site Description Unable to view 12/03/2015  8:00 PM  Dressing Status Clean;Dry;Intact 12/03/2015  8:00 PM  Drainage Appearance Bloody 12/03/2015  8:00 PM  Status To suction (Charged) 12/03/2015  8:00 PM  Output (mL) 15 mL 12/04/2015  6:00 AM     NG/OG Tube Orogastric 16 Fr. Center mouth (Active)  Placement Verification Auscultation 12/03/2015  8:00 PM  Site Assessment Clean;Dry 12/03/2015  8:00 PM  Status Suction-low intermittent 12/03/2015  8:00 PM     Urethral Catheter Alma DownsKristina Munnett, RN Latex;Straight-tip;Temperature probe 14 Fr. (Active)  Indication for Insertion or Continuance of Catheter Unstable critical patients (first 24-48 hours) 12/03/2015  8:00 PM  Site Assessment Clean;Intact 12/03/2015  8:00 PM  Catheter Maintenance Bag below level of bladder;Catheter secured;Drainage bag/tubing  not touching floor;Insertion date on drainage bag;No dependent loops;Seal intact 12/03/2015  8:00 PM  Collection Container Standard drainage bag 12/03/2015  8:00 PM  Securement Method Securing device (Describe) 12/03/2015  8:00 PM  Urinary Catheter Interventions Unclamped 12/03/2015  8:00 PM  Output (mL) 175 mL 12/04/2015  6:00 AM    Microbiology/Sepsis markers: Results for orders placed or performed during the hospital encounter of 12/03/15  MRSA PCR Screening     Status: None   Collection Time: 12/03/15  8:51 AM  Result Value Ref Range Status   MRSA by PCR NEGATIVE NEGATIVE Final    Comment:        The GeneXpert MRSA Assay (FDA approved for NASAL specimens only), is one component of a comprehensive MRSA colonization surveillance program. It is not intended to diagnose MRSA infection nor to guide or monitor treatment for MRSA infections.     Anti-infectives:  Anti-infectives    Start     Dose/Rate Route Frequency Ordered Stop   12/03/15 1200  ceFAZolin (ANCEF) IVPB 2g/100 mL premix     2 g 200 mL/hr over 30 Minutes Intravenous Every 8 hours 12/03/15 0806 12/03/15 2146   12/03/15 0643  bacitracin 50,000 Units in sodium chloride irrigation 0.9 % 500 mL irrigation  Status:  Discontinued       As needed 12/03/15 0643 12/03/15 0726   12/03/15 0415  ceFAZolin (ANCEF) IVPB 2g/100 mL premix     2 g 200 mL/hr over 30 Minutes Intravenous  Once 12/03/15 0413 12/03/15 0503      Best Practice/Protocols:  VTE Prophylaxis: Mechanical GI Prophylaxis: Proton Pump Inhibitor Continous Sedation Currently off sedation  Consults: Treatment Team:  Tia Alert, MD    Events:  Subjective:    Overnight Issues: None.  Weaned yesterday, but not able to get extubated.  Objective:  Vital signs for last 24 hours: Temp:  [97.2 F (36.2 C)-99.3 F (37.4 C)] 99 F (37.2 C) (04/26 0700) Pulse Rate:  [77-115] 106 (04/26 0813) Resp:  [9-28] 19 (04/26 0813) BP: (104-159)/(50-108) 135/70  mmHg (04/26 0813) SpO2:  [100 %] 100 % (04/26 0813) Arterial Line BP: (78-183)/(53-82) 160/82 mmHg (04/26 0600) FiO2 (%):  [30 %-40 %] 30 % (04/26 0813) Weight:  [98.1 kg (216 lb 4.3 oz)] 98.1 kg (216 lb 4.3 oz) (04/25 1630)  Hemodynamic parameters for last 24 hours:    Intake/Output from previous day: 04/25 0701 - 04/26 0700 In: 3799.8 [I.V.:3390.8; IV Piggyback:409] Out: 1290 [Urine:1190; Drains:100]  Intake/Output this shift:    Vent settings for last 24 hours: Vent Mode:  [-] CPAP;PSV FiO2 (%):  [30 %-40 %] 30 % Set Rate:  [16 bmp] 16 bmp Vt Set:  [450 mL] 450 mL PEEP:  [5 cmH20] 5 cmH20 Pressure Support:  [5 cmH20] 5 cmH20 Plateau Pressure:  [6 cmH20-16 cmH20] 16 cmH20  Physical Exam:  General: no respiratory distress Neuro: nonfocal exam, RASS 0, RASS -1, weakness right upper extremity and weakness right lower extremity Resp: clear to auscultation bilaterally CVS: regular rate and rhythm, S1, S2 normal, no murmur, click, rub or gallop Skin: no rash Extremities: no edema, no erythema, pulses WNL  Results for orders placed or performed during the hospital encounter of 12/03/15 (from the past 24 hour(s))  Provider-confirm verbal Blood Bank order - RBC, FFP; 2 Units; Order taken: 12/03/2015; 2:48 AM; Level 1 Trauma 2 RBC,2 FFP ordered,issued and returned     Status: None   Collection Time: 12/03/15  8:30 AM  Result Value Ref Range   Blood product order confirm MD AUTHORIZATION REQUESTED   MRSA PCR Screening     Status: None   Collection Time: 12/03/15  8:51 AM  Result Value Ref Range   MRSA by PCR NEGATIVE NEGATIVE  Pregnancy, urine     Status: None   Collection Time: 12/03/15 11:01 AM  Result Value Ref Range   Preg Test, Ur NEGATIVE NEGATIVE  CBC     Status: Abnormal   Collection Time: 12/03/15  1:05 PM  Result Value Ref Range   WBC 15.4 (H) 4.0 - 10.5 K/uL   RBC 3.18 (L) 3.87 - 5.11 MIL/uL   Hemoglobin 9.1 (L) 12.0 - 15.0 g/dL   HCT 09.8 (L) 11.9 - 14.7 %   MCV  87.7 78.0 - 100.0 fL   MCH 28.6 26.0 - 34.0 pg   MCHC 32.6 30.0 - 36.0 g/dL   RDW 82.9 56.2 - 13.0 %   Platelets 237 150 - 400 K/uL  Basic metabolic panel     Status: Abnormal   Collection Time: 12/04/15  4:42 AM  Result Value Ref Range   Sodium 137 135 - 145 mmol/L   Potassium 4.3 3.5 - 5.1 mmol/L   Chloride 110 101 - 111 mmol/L   CO2 19 (L) 22 - 32 mmol/L   Glucose, Bld 131 (H) 65 - 99 mg/dL   BUN <5 (L) 6 - 20 mg/dL   Creatinine, Ser 8.65 0.44 - 1.00 mg/dL   Calcium 7.5 (L) 8.9 - 10.3 mg/dL   GFR calc non Af Amer >60 >  60 mL/min   GFR calc Af Amer >60 >60 mL/min   Anion gap 8 5 - 15  CBC     Status: Abnormal   Collection Time: 12/04/15  4:42 AM  Result Value Ref Range   WBC 12.4 (H) 4.0 - 10.5 K/uL   RBC 2.85 (L) 3.87 - 5.11 MIL/uL   Hemoglobin 8.3 (L) 12.0 - 15.0 g/dL   HCT 16.1 (L) 09.6 - 04.5 %   MCV 87.7 78.0 - 100.0 fL   MCH 29.1 26.0 - 34.0 pg   MCHC 33.2 30.0 - 36.0 g/dL   RDW 40.9 81.1 - 91.4 %   Platelets 248 150 - 400 K/uL     Assessment/Plan:   NEURO  Altered Mental Status:  sedation and TBI   Plan: CPM.  Hold sedation for extubation.  PULM  May have some mild to mderate atelectasis of the LLL.  No infiltrates   Plan: Wean to extubate  CARDIO  No issues   Plan: CPM  RENAL  Urine output is good.   Plan: CPM  GI  No issues   Plan: CPM  ID  CPM with perioperative antibiotics   Plan: CPM  HEME  Anemia acute blood loss anemia)   Plan: No blood needed currently.  ENDO No issues   Plan: CPM  Global Issues  Patient doing very well and is following commands for the neurosurgeon.  Will try to wean to extubate soon.  Check ABG at noon.   Possible extubate later today.    LOS: 1 day   Additional comments:I reviewed the patient's new clinical lab test results. cbc/bmet and I reviewed the patients new imaging test results. cxr done  Critical Care Total Time*: 30 Minutes  Grasiela Jonsson 12/04/2015  *Care during the described time interval was provided  by me and/or other providers on the critical care team.  I have reviewed this patient's available data, including medical history, events of note, physical examination and test results as part of my evaluation.

## 2015-12-04 NOTE — Progress Notes (Signed)
[name], female   DOB: [birth year], 35 y.o.   MRN: [id number] No change in patient's exam. She opens her eyes to voice. She follows commands with her left hand. I see no movement in the legs. The etiology of this is unclear to me. She does not move her right arm. Intubated. Pupils are equal and reactive. Dressing is dry. Drain was removed. CT scan was reviewed and I agree with the radiology report.

## 2015-12-05 ENCOUNTER — Encounter (HOSPITAL_COMMUNITY): Payer: Self-pay | Admitting: Neurological Surgery

## 2015-12-05 LAB — CBC WITH DIFFERENTIAL/PLATELET
Basophils Absolute: 0 10*3/uL (ref 0.0–0.1)
Basophils Relative: 0 %
EOS ABS: 0.1 10*3/uL (ref 0.0–0.7)
Eosinophils Relative: 1 %
HCT: 22.8 % — ABNORMAL LOW (ref 36.0–46.0)
Hemoglobin: 7.4 g/dL — ABNORMAL LOW (ref 12.0–15.0)
LYMPHS ABS: 1.3 10*3/uL (ref 0.7–4.0)
Lymphocytes Relative: 13 %
MCH: 28.6 pg (ref 26.0–34.0)
MCHC: 32.5 g/dL (ref 30.0–36.0)
MCV: 88 fL (ref 78.0–100.0)
MONOS PCT: 9 %
Monocytes Absolute: 0.8 10*3/uL (ref 0.1–1.0)
NEUTROS ABS: 7.4 10*3/uL (ref 1.7–7.7)
NEUTROS PCT: 77 %
PLATELETS: 262 10*3/uL (ref 150–400)
RBC: 2.59 MIL/uL — AB (ref 3.87–5.11)
RDW: 14 % (ref 11.5–15.5)
WBC: 9.6 10*3/uL (ref 4.0–10.5)

## 2015-12-05 LAB — BASIC METABOLIC PANEL
ANION GAP: 7 (ref 5–15)
BUN: <5 mg/dL — ABNORMAL LOW (ref 6–20)
CHLORIDE: 111 mmol/L (ref 101–111)
CO2: 24 mmol/L (ref 22–32)
Calcium: 8 mg/dL — ABNORMAL LOW (ref 8.9–10.3)
Creatinine, Ser: 0.52 mg/dL (ref 0.44–1.00)
GFR calc non Af Amer: >60 mL/min (ref >60)
Glucose, Bld: 125 mg/dL — ABNORMAL HIGH (ref 65–99)
Potassium: 3.9 mmol/L (ref 3.5–5.1)
SODIUM: 142 mmol/L (ref 135–145)

## 2015-12-05 MED ORDER — CHLORHEXIDINE GLUCONATE 0.12 % MT SOLN
OROMUCOSAL | Status: AC
Start: 2015-12-05 — End: 2015-12-05
  Filled 2015-12-05: qty 15

## 2015-12-05 MED ORDER — ANTISEPTIC ORAL RINSE SOLUTION (CORINZ)
7.0000 mL | OROMUCOSAL | Status: DC
  Administered 2015-12-05 – 2015-12-12 (×37): 7 mL via OROMUCOSAL

## 2015-12-05 MED ORDER — FENTANYL CITRATE (PF) 100 MCG/2ML IJ SOLN
25.0000 ug | INTRAMUSCULAR | Status: DC | PRN
  Administered 2015-12-05 – 2015-12-10 (×16): 50 ug via INTRAVENOUS
  Filled 2015-12-05 (×16): qty 2

## 2015-12-05 NOTE — Progress Notes (Signed)
[name], female   DOB: 07-Jan-1981, 35 y.o.   MRN: [id number] Patient's eyes are swollen shut today. Purposeful with the left arm and occasionally follows commands. Still no movement in the right upper extremity or the lower extremities. Etiology of lower extremity signs unclear. At some point she will likely need imaging, but given her current state and the fact that if she does have a neurologic deficit it appears to be complete I don't believe that diagnosis at this point would change our treatment algorithm and therefore I don't think imaging is urgent. Continues to wean to extubation. Dressing dry.
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
## 2015-12-05 NOTE — Procedures (Signed)
Extubation Procedure Note  Patient Details:   Name: [name] XXXRoyster DOB: 06-Mar-1981 MRN: [id number]   Airway Documentation:     Evaluation  O2 sats: stable throughout Complications: No apparent complications Patient did tolerate procedure well. Bilateral Breath Sounds: Diminished, Clear   No  Positive cuff leak   Ok AnisKelly Smith, MA 12/05/2015, 9:20 AM
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
## 2015-12-06 ENCOUNTER — Inpatient Hospital Stay (HOSPITAL_COMMUNITY): Payer: Medicare Other

## 2015-12-06 LAB — CBC
HCT: 25.4 % — ABNORMAL LOW (ref 36.0–46.0)
HEMOGLOBIN: 8.1 g/dL — AB (ref 12.0–15.0)
MCH: 28.4 pg (ref 26.0–34.0)
MCHC: 31.9 g/dL (ref 30.0–36.0)
MCV: 89.1 fL (ref 78.0–100.0)
Platelets: 296 10*3/uL (ref 150–400)
RBC: 2.85 MIL/uL — AB (ref 3.87–5.11)
RDW: 13.5 % (ref 11.5–15.5)
WBC: 9.5 10*3/uL (ref 4.0–10.5)

## 2015-12-06 LAB — BASIC METABOLIC PANEL
ANION GAP: 12 (ref 5–15)
CHLORIDE: 110 mmol/L (ref 101–111)
CO2: 20 mmol/L — ABNORMAL LOW (ref 22–32)
Calcium: 8.3 mg/dL — ABNORMAL LOW (ref 8.9–10.3)
Creatinine, Ser: 0.64 mg/dL (ref 0.44–1.00)
GFR calc Af Amer: >60 mL/min (ref >60)
GLUCOSE: 100 mg/dL — AB (ref 65–99)
POTASSIUM: 3.3 mmol/L — AB (ref 3.5–5.1)
SODIUM: 142 mmol/L (ref 135–145)

## 2015-12-06 LAB — GLUCOSE, CAPILLARY
GLUCOSE-CAPILLARY: 83 mg/dL (ref 65–99)
Glucose-Capillary: 84 mg/dL (ref 65–99)

## 2015-12-06 MED ORDER — PRO-STAT SUGAR FREE PO LIQD
30.0000 mL | Freq: Every day | ORAL | Status: DC
  Administered 2015-12-08 – 2015-12-10 (×3): 30 mL
  Filled 2015-12-06 (×4): qty 30

## 2015-12-06 MED ORDER — POTASSIUM CHLORIDE 10 MEQ/100ML IV SOLN
10.0000 meq | INTRAVENOUS | Status: AC
  Administered 2015-12-06 (×3): 10 meq via INTRAVENOUS
  Filled 2015-12-06 (×3): qty 100

## 2015-12-06 MED ORDER — PIVOT 1.5 CAL PO LIQD
1000.0000 mL | ORAL | Status: DC
  Administered 2015-12-06 – 2015-12-08 (×3): 1000 mL
  Filled 2015-12-06 (×7): qty 1000

## 2015-12-06 NOTE — Progress Notes (Addendum)
Speech Language Pathology Treatment: Cognitive-Linquistic  Patient Details Name: [name] XXXRoyster MRN: [id number] DOB: [birth year] Today's Date: 12/06/2015 Time: [id number] SLP Time Calculation (min) (ACUTE ONLY): 26 min  Assessment / Plan / Recommendation Clinical Impression  Pt seen in conjunction with OT. Pt opened eyes x 1 once during repositioning (on side) of bed linen. Max verbal, visual, tactile stimulation provided for command following, however pt did not respond. She responded to painful stimuli and purposefully raised her hand to remove SLP's hand from her muscle belly of shoulder x 2. She is exhibiting behaviors of a Rancho II (generalized response). No response to ice on lips; spontaneous swallow x 3 throughout session. She is not yet ready for assessment of swallow. SLP will continue to follow.    HPI HPI: see TBI team evaluation      SLP Plan  Continue with current plan of care     Recommendations  Medication Administration: Via alternative means             Oral Care Recommendations: Oral care QID Follow up Recommendations: Inpatient Rehab Plan: Continue with current plan of care     GO                Royce MacadamiaLitaker, Ching Rabideau Willis 12/06/2015, 3:53 PM

## 2015-12-06 NOTE — Progress Notes (Signed)
Patient ID: [name] XXXLyanne [name], female   DOB: 27-Oct-1980, 35 y.o.   MRN: [id number] Extubated now. Otherwise no change. Follows commands with left upper extremity. Opens both eyes. Pupils are okay. Dressing dry. Continue current management.
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
## 2015-12-06 NOTE — Progress Notes (Signed)
Pt successfully extubated on 12/05/15.  Pt not verbal and not following commands[facility]  Pt to start tube feedings today via Cortrak feeding tube.  Will continue to follow progress.    Quintella BatonJulie W. Ita Fritzsche, RN, BSN  Trauma/Neuro ICU Case Manager [phone]
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
## 2015-12-07 ENCOUNTER — Inpatient Hospital Stay (HOSPITAL_COMMUNITY): Payer: Medicare Other

## 2015-12-07 LAB — TYPE AND SCREEN
ABO/RH(D): B POS
Antibody Screen: NEGATIVE
UNIT DIVISION: 0
UNIT DIVISION: 0
UNIT DIVISION: 0
UNIT DIVISION: 0
UNIT DIVISION: 0
Unit division: 0

## 2015-12-07 LAB — BASIC METABOLIC PANEL
ANION GAP: 10 (ref 5–15)
BUN: 6 mg/dL (ref 6–20)
CALCIUM: 8.4 mg/dL — AB (ref 8.9–10.3)
CO2: 18 mmol/L — AB (ref 22–32)
CREATININE: 0.49 mg/dL (ref 0.44–1.00)
Chloride: 109 mmol/L (ref 101–111)
Glucose, Bld: 123 mg/dL — ABNORMAL HIGH (ref 65–99)
Potassium: 3.7 mmol/L (ref 3.5–5.1)
SODIUM: 137 mmol/L (ref 135–145)

## 2015-12-07 LAB — GLUCOSE, CAPILLARY
GLUCOSE-CAPILLARY: 100 mg/dL — AB (ref 65–99)
GLUCOSE-CAPILLARY: 115 mg/dL — AB (ref 65–99)
GLUCOSE-CAPILLARY: 129 mg/dL — AB (ref 65–99)
GLUCOSE-CAPILLARY: 91 mg/dL (ref 65–99)
Glucose-Capillary: 92 mg/dL (ref 65–99)
Glucose-Capillary: 92 mg/dL (ref 65–99)
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
## 2015-12-07 NOTE — Progress Notes (Signed)
[name], female   DOB: [birth year], 35 y.o.   MRN: [id number] BP 146/72 mmHg  Pulse 107  Temp(Src) 99.3 F (37.4 C) (Core (Comment))  Resp 19  Ht 5\' 4"  (1.626 m)  Wt 93.3 kg (205 lb 11 oz)  BMI 35.29 kg/m2  SpO2 97%  LMP  (LMP Unknown) Attends+/- with left eye Not following commands Moving left side, minimal if any on right side. No neurologic changes.

## 2015-12-08 LAB — GLUCOSE, CAPILLARY
GLUCOSE-CAPILLARY: 104 mg/dL — AB (ref 65–99)
GLUCOSE-CAPILLARY: 114 mg/dL — AB (ref 65–99)
GLUCOSE-CAPILLARY: 120 mg/dL — AB (ref 65–99)
GLUCOSE-CAPILLARY: 126 mg/dL — AB (ref 65–99)
Glucose-Capillary: 103 mg/dL — ABNORMAL HIGH (ref 65–99)
Glucose-Capillary: 106 mg/dL — ABNORMAL HIGH (ref 65–99)
Glucose-Capillary: 123 mg/dL — ABNORMAL HIGH (ref 65–99)

## 2015-12-08 MED ORDER — PANTOPRAZOLE SODIUM 40 MG PO PACK
40.0000 mg | PACK | Freq: Every day | ORAL | Status: DC
  Administered 2015-12-08 – 2015-12-10 (×3): 40 mg
  Filled 2015-12-08 (×3): qty 20
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
## 2015-12-09 LAB — GLUCOSE, CAPILLARY
GLUCOSE-CAPILLARY: 141 mg/dL — AB (ref 65–99)
GLUCOSE-CAPILLARY: 148 mg/dL — AB (ref 65–99)
GLUCOSE-CAPILLARY: 121 mg/dL — AB (ref 65–99)
Glucose-Capillary: 141 mg/dL — ABNORMAL HIGH (ref 65–99)
Glucose-Capillary: 123 mg/dL — ABNORMAL HIGH (ref 65–99)

## 2015-12-09 MED ORDER — VENLAFAXINE HCL 37.5 MG PO TABS
75.0000 mg | ORAL_TABLET | Freq: Three times a day (TID) | ORAL | Status: DC
  Administered 2015-12-09 – 2015-12-11 (×6): 75 mg
  Filled 2015-12-09 (×2): qty 1
  Filled 2015-12-09: qty 2
  Filled 2015-12-09 (×2): qty 1
  Filled 2015-12-09: qty 2
  Filled 2015-12-09 (×2): qty 1

## 2015-12-09 MED ORDER — GABAPENTIN 250 MG/5ML PO SOLN
900.0000 mg | Freq: Every day | ORAL | Status: DC
  Administered 2015-12-09 – 2015-12-11 (×3): 900 mg via ORAL
  Filled 2015-12-09 (×5): qty 20

## 2015-12-09 MED ORDER — ACETAMINOPHEN 160 MG/5ML PO SOLN
650.0000 mg | Freq: Four times a day (QID) | ORAL | Status: DC
  Administered 2015-12-09 – 2015-12-10 (×5): 650 mg
  Filled 2015-12-09 (×6): qty 20.3
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
## 2015-12-09 NOTE — Progress Notes (Signed)
Rehab Admissions Coordinator Note:  Patient was screened by Clois DupesBoyette, Azriella Mattia Godwin for appropriateness for an Inpatient Acute Rehab Consult per therapy recommendations.   At this time, we are recommending Inpatient Rehab consult. Please place order.  Clois DupesBoyette, Aziel Morgan Godwin 12/09/2015, 6:26 PM  I can be reached at [phone].

## 2015-12-09 NOTE — Progress Notes (Signed)
Speech Language Pathology Treatment: Cognitive-Linquistic  Patient Details Name: [name] XXXRoyster MRN: [id number] DOB: [birth year] Today's Date: 12/09/2015 Time: [id number] SLP Time Calculation (min) (ACUTE ONLY): 36 min  Assessment / Plan / Recommendation Clinical Impression  Pt more alert and more consistently following commands, particularly with LUE, with Min-Mod cues and increased time during co-tx with PT. She needs Mod cues for initiation, but does demonstrate purposeful behavior during self-fed PO trials. Still no verbal output noted, although it did appear as though she tried to respond when PT asked if she would like her TV turned on at the end of session. Affect remains flat. Continue to recommend CIR.   HPI HPI: see TBI team evaluation      SLP Plan  Continue with current plan of care     Recommendations         Follow up Recommendations: Inpatient Rehab Plan: Continue with current plan of care     GO               Maxcine HamLaura Paiewonsky, M.A. CCC-SLP [phone]  Maxcine Hamaiewonsky, Nathasha Fiorillo 12/09/2015, 4:43 PM

## 2015-12-09 NOTE — Care Management Important Message (Signed)
Important Message  Patient Details  Name: Sara [name] MRN: [id number] Date of Birth: 1981/02/25   Medicare Important Message Given:  Yes    Kyla BalzarineShealy, Toshiko Kemler Abena 12/09/2015, 4:09 PM
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
## 2015-12-10 DIAGNOSIS — R1310 Dysphagia, unspecified: Secondary | ICD-10-CM | POA: Insufficient documentation

## 2015-12-10 DIAGNOSIS — D62 Acute posthemorrhagic anemia: Secondary | ICD-10-CM | POA: Diagnosis not present

## 2015-12-10 DIAGNOSIS — S069X3A Unspecified intracranial injury with loss of consciousness of 1 hour to 5 hours 59 minutes, initial encounter: Secondary | ICD-10-CM | POA: Diagnosis present

## 2015-12-10 DIAGNOSIS — W3400XA Accidental discharge from unspecified firearms or gun, initial encounter: Secondary | ICD-10-CM

## 2015-12-10 DIAGNOSIS — I6200 Nontraumatic subdural hemorrhage, unspecified: Secondary | ICD-10-CM

## 2015-12-10 DIAGNOSIS — S069X9D Unspecified intracranial injury with loss of consciousness of unspecified duration, subsequent encounter: Secondary | ICD-10-CM

## 2015-12-10 DIAGNOSIS — T148 Other injury of unspecified body region: Secondary | ICD-10-CM

## 2015-12-10 DIAGNOSIS — S065X9A Traumatic subdural hemorrhage with loss of consciousness of unspecified duration, initial encounter: Secondary | ICD-10-CM | POA: Insufficient documentation

## 2015-12-10 DIAGNOSIS — R739 Hyperglycemia, unspecified: Secondary | ICD-10-CM

## 2015-12-10 DIAGNOSIS — S065XAA Traumatic subdural hemorrhage with loss of consciousness status unknown, initial encounter: Secondary | ICD-10-CM | POA: Insufficient documentation

## 2015-12-10 DIAGNOSIS — I10 Essential (primary) hypertension: Secondary | ICD-10-CM

## 2015-12-10 LAB — GLUCOSE, CAPILLARY
GLUCOSE-CAPILLARY: 153 mg/dL — AB (ref 65–99)
GLUCOSE-CAPILLARY: 111 mg/dL — AB (ref 65–99)
Glucose-Capillary: 136 mg/dL — ABNORMAL HIGH (ref 65–99)
Glucose-Capillary: 127 mg/dL — ABNORMAL HIGH (ref 65–99)
Glucose-Capillary: 149 mg/dL — ABNORMAL HIGH (ref 65–99)
Glucose-Capillary: 129 mg/dL — ABNORMAL HIGH (ref 65–99)

## 2015-12-10 MED ORDER — AMANTADINE HCL 50 MG/5ML PO SYRP
100.0000 mg | ORAL_SOLUTION | Freq: Two times a day (BID) | ORAL | Status: DC
  Filled 2015-12-10 (×3): qty 10

## 2015-12-10 MED ORDER — SODIUM CHLORIDE 0.9 % IV SOLN
INTRAVENOUS | Status: DC
  Administered 2015-12-10 – 2015-12-11 (×2): via INTRAVENOUS
  Filled 2015-12-10 (×4): qty 1000

## 2015-12-10 MED ORDER — SODIUM CHLORIDE 0.9% FLUSH: 10.0000 mL | INTRAVENOUS | Status: DC | PRN

## 2015-12-10 NOTE — Progress Notes (Signed)
Speech Language Pathology Treatment: Dysphagia;Cognitive-Linquistic  Patient Details Name: Sara [name] XXXRoyster MRN: [id number] DOB: [birth year] Today's Date: 12/10/2015 Time: [id number] SLP Time Calculation (min) (ACUTE ONLY): 21 min  Assessment / Plan / Recommendation Clinical Impression  Pt has increased verbalizations today, although still with delayed responses. She was 60% accurate with confrontational naming trials with extra time, with accuracy increased to 90% with sentence completion and/or phonemic cueing. Pt self-fed two cups of water via straw, applesauce, and graham crackers with no overt s/s of aspiration. When asked if she was thirsty, she responded "you betcha" after delay. Recommend to start with Dys 2 diet given current cognitive-linguistic status and thin liquids. Will continue to follow.   HPI HPI: see TBI team evaluation      SLP Plan  Continue with current plan of care     Recommendations  Diet recommendations: Dysphagia 2 (fine chop);Thin liquid Liquids provided via: Cup;Straw Medication Administration: Crushed with puree Supervision: Patient able to self feed;Full supervision/cueing for compensatory strategies Compensations: Minimize environmental distractions;Slow rate;Small sips/bites Postural Changes and/or Swallow Maneuvers: Seated upright 90 degrees;Upright 30-60 min after meal             Oral Care Recommendations: Oral care BID Follow up Recommendations: Inpatient Rehab Plan: Continue with current plan of care     GO               Maxcine HamLaura Paiewonsky, M.A. CCC-SLP [phone]  Maxcine Hamaiewonsky, Chrys Landgrebe 12/10/2015, 4:32 PM

## 2015-12-10 NOTE — Progress Notes (Signed)
Patient removed NGT contacted PA Jeffery updated on patients condition received no new orders at this time.
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
## 2015-12-10 NOTE — Progress Notes (Signed)
Received report on patient at this time.

## 2015-12-10 NOTE — Progress Notes (Signed)
[name], female   DOB: Apr 12, 1981, 35 y.o.   MRN: [id number] More awake, FC on L, moves LUE/ LLE well, R hemiplegic, expressive aphasia, wound looks ok
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
## 2015-12-10 NOTE — Progress Notes (Signed)
I will follow up with family tomorrow to begin discussion of rehab venue options and goals . [phone]

## 2015-12-10 NOTE — Progress Notes (Signed)
Assisted NA with bathing, repositioning, and changing bed linens for direct patient care. Patient unable to turn without nursing assistance. Family member has left the bedside and went home. Patient incontinent of urine. Callbell in reach. Bed alarm set. Will continue to monitor.

## 2015-12-10 NOTE — Progress Notes (Signed)
   12/10/15 1649  Clinical Encounter Type  Visited With Patient and family together;Health care provider  Visit Type Initial;Spiritual support  Referral From Nurse  Spiritual Encounters  Spiritual Needs Prayer   Chaplain responded to a request from the unit secretary to check in with the patient. Patient is still quite non-communicative, but chaplain also chatted briefly with patient's mother. Patient's mother requested prayer, which chaplain offered. Spiritual care services are available as needed.   Alda Ponderdam M Ronna Herskowitz, Chaplain 12/10/2015 4:51 PM
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
## 2015-12-11 LAB — GLUCOSE, CAPILLARY
GLUCOSE-CAPILLARY: 111 mg/dL — AB (ref 65–99)
Glucose-Capillary: 111 mg/dL — ABNORMAL HIGH (ref 65–99)
Glucose-Capillary: 125 mg/dL — ABNORMAL HIGH (ref 65–99)

## 2015-12-11 MED ORDER — AMANTADINE HCL 100 MG PO CAPS
100.0000 mg | ORAL_CAPSULE | Freq: Two times a day (BID) | ORAL | Status: DC
  Administered 2015-12-11 – 2015-12-12 (×3): 100 mg via ORAL
  Filled 2015-12-11 (×3): qty 1

## 2015-12-11 MED ORDER — ADULT MULTIVITAMIN W/MINERALS CH
1.0000 | ORAL_TABLET | Freq: Every day | ORAL | Status: DC
  Administered 2015-12-12: 1 via ORAL
  Filled 2015-12-11: qty 1

## 2015-12-11 MED ORDER — TRAMADOL HCL 50 MG PO TABS
50.0000 mg | ORAL_TABLET | Freq: Four times a day (QID) | ORAL | Status: DC | PRN
  Administered 2015-12-11: 50 mg via ORAL
  Filled 2015-12-11: qty 1

## 2015-12-11 MED ORDER — PANTOPRAZOLE SODIUM 40 MG PO TBEC
40.0000 mg | DELAYED_RELEASE_TABLET | Freq: Every day | ORAL | Status: DC
  Administered 2015-12-11 – 2015-12-12 (×2): 40 mg via ORAL
  Filled 2015-12-11 (×2): qty 1

## 2015-12-11 MED ORDER — LEVETIRACETAM 500 MG PO TABS
500.0000 mg | ORAL_TABLET | Freq: Two times a day (BID) | ORAL | Status: DC
  Administered 2015-12-11 (×2): 500 mg via ORAL
  Filled 2015-12-11 (×2): qty 1

## 2015-12-11 MED ORDER — VENLAFAXINE HCL 37.5 MG PO TABS
75.0000 mg | ORAL_TABLET | Freq: Three times a day (TID) | ORAL | Status: DC
  Administered 2015-12-11 – 2015-12-12 (×4): 75 mg via ORAL
  Filled 2015-12-11 (×4): qty 2

## 2015-12-11 MED ORDER — ACETAMINOPHEN 160 MG/5ML PO SOLN: 650.0000 mg | Freq: Four times a day (QID) | ORAL | Status: DC

## 2015-12-11 MED ORDER — ENSURE ENLIVE PO LIQD
237.0000 mL | ORAL | Status: DC
  Administered 2015-12-11: 237 mL via ORAL
  Filled 2015-12-11 (×2): qty 237
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
## 2015-12-11 NOTE — Progress Notes (Signed)
[name], female   DOB: 1981-04-08, 35 y.o.   MRN: [id number] Pt looks better, wide awake moves L side well, wound stable but i still have concerns about its ability to fully heal without further intervention, speaking some words, FC
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
## 2015-12-11 NOTE — Progress Notes (Deleted)
Pt for discharge home today. Discharge orders received. IV and telemetry dcd with dressing clean dry and intact. Discharge instructions and prescriptions given with verbalized understanding. Family at bedside to assist with discharge. Staff brought patient to lobby via wheelchair at this time. Transported to home by family member.
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
## 2015-12-12 ENCOUNTER — Inpatient Hospital Stay (HOSPITAL_COMMUNITY): Payer: Medicare Other

## 2015-12-12 ENCOUNTER — Encounter: Payer: Self-pay | Admitting: Family Medicine

## 2015-12-12 ENCOUNTER — Inpatient Hospital Stay (HOSPITAL_COMMUNITY)
Admission: RE | Admit: 2015-12-12 | Discharge: 2016-01-10 | DRG: 945 | Disposition: A | Discharge status: Home / Self Care | Payer: Medicare Other | Source: Intra-hospital | Attending: Physical Medicine & Rehabilitation | Admitting: Physical Medicine & Rehabilitation

## 2015-12-12 DIAGNOSIS — K5900 Constipation, unspecified: Secondary | ICD-10-CM | POA: Diagnosis present

## 2015-12-12 DIAGNOSIS — D62 Acute posthemorrhagic anemia: Secondary | ICD-10-CM | POA: Diagnosis present

## 2015-12-12 DIAGNOSIS — S0291XD Unspecified fracture of skull, subsequent encounter for fracture with routine healing: Secondary | ICD-10-CM

## 2015-12-12 DIAGNOSIS — S0101XD Laceration without foreign body of scalp, subsequent encounter: Secondary | ICD-10-CM

## 2015-12-12 DIAGNOSIS — R609 Edema, unspecified: Secondary | ICD-10-CM

## 2015-12-12 DIAGNOSIS — G44321 Chronic post-traumatic headache, intractable: Secondary | ICD-10-CM | POA: Diagnosis not present

## 2015-12-12 DIAGNOSIS — R1310 Dysphagia, unspecified: Secondary | ICD-10-CM | POA: Diagnosis present

## 2015-12-12 DIAGNOSIS — M62838 Other muscle spasm: Secondary | ICD-10-CM | POA: Insufficient documentation

## 2015-12-12 DIAGNOSIS — S066X9D Traumatic subarachnoid hemorrhage with loss of consciousness of unspecified duration, subsequent encounter: Secondary | ICD-10-CM | POA: Diagnosis not present

## 2015-12-12 DIAGNOSIS — S069X3A Unspecified intracranial injury with loss of consciousness of 1 hour to 5 hours 59 minutes, initial encounter: Secondary | ICD-10-CM | POA: Diagnosis present

## 2015-12-12 DIAGNOSIS — S069X2S Unspecified intracranial injury with loss of consciousness of 31 minutes to 59 minutes, sequela: Secondary | ICD-10-CM | POA: Diagnosis not present

## 2015-12-12 DIAGNOSIS — G8111 Spastic hemiplegia affecting right dominant side: Secondary | ICD-10-CM | POA: Diagnosis present

## 2015-12-12 DIAGNOSIS — S0193XD Puncture wound without foreign body of unspecified part of head, subsequent encounter: Secondary | ICD-10-CM | POA: Diagnosis not present

## 2015-12-12 DIAGNOSIS — S06339D Contusion and laceration of cerebrum, unspecified, with loss of consciousness of unspecified duration, subsequent encounter: Secondary | ICD-10-CM | POA: Diagnosis present

## 2015-12-12 DIAGNOSIS — Z79899 Other long term (current) drug therapy: Secondary | ICD-10-CM | POA: Diagnosis not present

## 2015-12-12 DIAGNOSIS — F4321 Adjustment disorder with depressed mood: Secondary | ICD-10-CM | POA: Diagnosis present

## 2015-12-12 DIAGNOSIS — W3400XD Accidental discharge from unspecified firearms or gun, subsequent encounter: Secondary | ICD-10-CM

## 2015-12-12 DIAGNOSIS — M6249 Contracture of muscle, multiple sites: Secondary | ICD-10-CM | POA: Diagnosis not present

## 2015-12-12 DIAGNOSIS — S069X9D Unspecified intracranial injury with loss of consciousness of unspecified duration, subsequent encounter: Secondary | ICD-10-CM

## 2015-12-12 DIAGNOSIS — S069X3S Unspecified intracranial injury with loss of consciousness of 1 hour to 5 hours 59 minutes, sequela: Secondary | ICD-10-CM | POA: Diagnosis not present

## 2015-12-12 DIAGNOSIS — S0100XD Unspecified open wound of scalp, subsequent encounter: Secondary | ICD-10-CM | POA: Diagnosis not present

## 2015-12-12 LAB — CBC
HEMATOCRIT: 27 % — AB (ref 36.0–46.0)
Hemoglobin: 8.5 g/dL — ABNORMAL LOW (ref 12.0–15.0)
MCH: 28.1 pg (ref 26.0–34.0)
MCHC: 31.5 g/dL (ref 30.0–36.0)
MCV: 89.4 fL (ref 78.0–100.0)
Platelets: 595 10*3/uL — ABNORMAL HIGH (ref 150–400)
RBC: 3.02 MIL/uL — ABNORMAL LOW (ref 3.87–5.11)
RDW: 13.2 % (ref 11.5–15.5)
WBC: 9.5 10*3/uL (ref 4.0–10.5)

## 2015-12-12 LAB — CREATININE, SERUM
Creatinine, Ser: 0.64 mg/dL (ref 0.44–1.00)
GFR calc non Af Amer: >60 mL/min (ref >60)

## 2015-12-12 MED ORDER — ONDANSETRON HCL 4 MG/2ML IJ SOLN: 4.0000 mg | Freq: Four times a day (QID) | INTRAMUSCULAR | Status: DC | PRN

## 2015-12-12 MED ORDER — PANTOPRAZOLE SODIUM 40 MG PO TBEC
40.0000 mg | DELAYED_RELEASE_TABLET | Freq: Every day | ORAL | Status: DC
  Administered 2015-12-13 – 2016-01-10 (×29): 40 mg via ORAL
  Filled 2015-12-12 (×29): qty 1

## 2015-12-12 MED ORDER — TRAMADOL HCL 50 MG PO TABS
50.0000 mg | ORAL_TABLET | Freq: Four times a day (QID) | ORAL | Status: DC | PRN
Start: 2015-12-12 — End: 2016-01-10
  Administered 2015-12-18 – 2015-12-28 (×4): 100 mg via ORAL
  Filled 2015-12-12 (×3): qty 2
  Filled 2015-12-12: qty 1
  Filled 2015-12-12 (×2): qty 2

## 2015-12-12 MED ORDER — GABAPENTIN 250 MG/5ML PO SOLN
900.0000 mg | Freq: Every day | ORAL | Status: DC
  Administered 2015-12-12 – 2015-12-23 (×12): 900 mg via ORAL
  Filled 2015-12-12 (×14): qty 20

## 2015-12-12 MED ORDER — ACETAMINOPHEN 325 MG PO TABS
325.0000 mg | ORAL_TABLET | ORAL | Status: DC | PRN
  Administered 2015-12-21 – 2016-01-04 (×5): 650 mg via ORAL
  Filled 2015-12-12 (×5): qty 2

## 2015-12-12 MED ORDER — ENOXAPARIN SODIUM 40 MG/0.4ML ~~LOC~~ SOLN
40.0000 mg | SUBCUTANEOUS | Status: DC
  Administered 2015-12-12: 40 mg via SUBCUTANEOUS
  Filled 2015-12-12: qty 0.4

## 2015-12-12 MED ORDER — TRAZODONE HCL 100 MG PO TABS: 100.0000 mg | ORAL_TABLET | Freq: Every day | ORAL | Status: DC

## 2015-12-12 MED ORDER — CLONAZEPAM 0.5 MG PO TABS: 0.5000 mg | ORAL_TABLET | Freq: Three times a day (TID) | ORAL | Status: DC | PRN

## 2015-12-12 MED ORDER — AMANTADINE HCL 100 MG PO CAPS
100.0000 mg | ORAL_CAPSULE | Freq: Two times a day (BID) | ORAL | Status: DC
  Administered 2015-12-12 – 2016-01-02 (×42): 100 mg via ORAL
  Filled 2015-12-12 (×42): qty 1

## 2015-12-12 MED ORDER — ADULT MULTIVITAMIN W/MINERALS CH
1.0000 | ORAL_TABLET | Freq: Every day | ORAL | Status: DC
  Administered 2015-12-13 – 2016-01-10 (×29): 1 via ORAL
  Filled 2015-12-12 (×29): qty 1

## 2015-12-12 MED ORDER — VENLAFAXINE HCL 75 MG PO TABS
75.0000 mg | ORAL_TABLET | Freq: Three times a day (TID) | ORAL | Status: DC
  Administered 2015-12-12 – 2016-01-10 (×85): 75 mg via ORAL
  Filled 2015-12-12 (×91): qty 1

## 2015-12-12 MED ORDER — BACLOFEN 5 MG HALF TABLET
5.0000 mg | ORAL_TABLET | Freq: Two times a day (BID) | ORAL | Status: DC
  Administered 2015-12-12 – 2015-12-14 (×4): 5 mg via ORAL
  Filled 2015-12-12 (×4): qty 1

## 2015-12-12 MED ORDER — LAMOTRIGINE 100 MG PO TABS
100.0000 mg | ORAL_TABLET | Freq: Every day | ORAL | Status: DC
  Administered 2015-12-12: 100 mg via ORAL
  Filled 2015-12-12: qty 1

## 2015-12-12 MED ORDER — TRAZODONE HCL 50 MG PO TABS
100.0000 mg | ORAL_TABLET | Freq: Every day | ORAL | Status: DC
  Administered 2015-12-12 – 2016-01-09 (×29): 100 mg via ORAL
  Filled 2015-12-12 (×32): qty 2

## 2015-12-12 MED ORDER — ENOXAPARIN SODIUM 40 MG/0.4ML ~~LOC~~ SOLN: 40.0000 mg | SUBCUTANEOUS | Status: DC

## 2015-12-12 MED ORDER — ENOXAPARIN SODIUM 40 MG/0.4ML ~~LOC~~ SOLN
40.0000 mg | SUBCUTANEOUS | Status: DC
  Administered 2015-12-13 – 2016-01-09 (×28): 40 mg via SUBCUTANEOUS
  Filled 2015-12-12 (×28): qty 0.4

## 2015-12-12 MED ORDER — ONDANSETRON HCL 4 MG PO TABS: 4.0000 mg | ORAL_TABLET | Freq: Four times a day (QID) | ORAL | Status: DC | PRN

## 2015-12-12 MED ORDER — SORBITOL 70 % SOLN
30.0000 mL | Freq: Every day | Status: DC | PRN
  Filled 2015-12-12: qty 30

## 2015-12-12 MED ORDER — LAMOTRIGINE 100 MG PO TABS
100.0000 mg | ORAL_TABLET | Freq: Every day | ORAL | Status: DC
  Administered 2015-12-13 – 2016-01-10 (×29): 100 mg via ORAL
  Filled 2015-12-12 (×31): qty 1
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
## 2015-12-12 NOTE — Progress Notes (Signed)
I met with pt and her sister, Velna Hatchet, at bedside and spoke with her Mom by phone. I have an inpt rehab bed available today and they are in agreement to admit today. I will meet with Mom at bedside to complete paperwork. I have notified Lang Snow, Security, of planned move and he states he will discuss with Ernie of [facility] site [facility] to increase rounding on rehab unit. I have discussed with Nursing director of rehab and therapy supervisor. Trauma PA, RN CM and SW are aware of d/c today.
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
## 2015-12-12 NOTE — Progress Notes (Signed)
VASCULAR LAB PRELIMINARY  PRELIMINARY  PRELIMINARY  PRELIMINARY  Bilateral lower extremity venous duplex completed.    Preliminary report:  There is no DVT or SVT noted in the bilateral lower extremities.   Fletcher Rathbun, RVT 12/12/2015, 3:31 PM
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
## 2015-12-12 NOTE — Care Management Important Message (Signed)
Important Message  Patient Details  Name: Sara [name] MRN: [id number] Date of Birth: [birth year]   Medicare Important Message Given:  Yes    [name] 12/12/2015, 12:36 PM

## 2015-12-13 ENCOUNTER — Inpatient Hospital Stay (HOSPITAL_COMMUNITY): Payer: Medicare Other | Admitting: Speech Pathology

## 2015-12-13 ENCOUNTER — Inpatient Hospital Stay (HOSPITAL_COMMUNITY): Payer: Medicare Other | Admitting: Physical Therapy

## 2015-12-13 ENCOUNTER — Inpatient Hospital Stay (HOSPITAL_COMMUNITY): Payer: Medicare Other | Admitting: Occupational Therapy

## 2015-12-13 DIAGNOSIS — G44321 Chronic post-traumatic headache, intractable: Secondary | ICD-10-CM

## 2015-12-13 DIAGNOSIS — D62 Acute posthemorrhagic anemia: Secondary | ICD-10-CM

## 2015-12-13 DIAGNOSIS — S069X3S Unspecified intracranial injury with loss of consciousness of 1 hour to 5 hours 59 minutes, sequela: Secondary | ICD-10-CM

## 2015-12-13 LAB — CBC WITH DIFFERENTIAL/PLATELET
Basophils Absolute: 0.1 10*3/uL (ref 0.0–0.1)
Basophils Relative: 1 %
EOS PCT: 2 %
Eosinophils Absolute: 0.1 10*3/uL (ref 0.0–0.7)
HEMATOCRIT: 26.3 % — AB (ref 36.0–46.0)
Hemoglobin: 8.3 g/dL — ABNORMAL LOW (ref 12.0–15.0)
LYMPHS PCT: 29 %
Lymphs Abs: 2.4 10*3/uL (ref 0.7–4.0)
MCH: 28 pg (ref 26.0–34.0)
MCHC: 31.6 g/dL (ref 30.0–36.0)
MCV: 88.9 fL (ref 78.0–100.0)
MONO ABS: 0.7 10*3/uL (ref 0.1–1.0)
MONOS PCT: 8 %
NEUTROS ABS: 5 10*3/uL (ref 1.7–7.7)
Neutrophils Relative %: 60 %
PLATELETS: 537 10*3/uL — AB (ref 150–400)
RBC: 2.96 MIL/uL — ABNORMAL LOW (ref 3.87–5.11)
RDW: 13.1 % (ref 11.5–15.5)
WBC: 8.2 10*3/uL (ref 4.0–10.5)

## 2015-12-13 LAB — COMPREHENSIVE METABOLIC PANEL
ALT: 40 U/L (ref 14–54)
ANION GAP: 13 (ref 5–15)
AST: 48 U/L — ABNORMAL HIGH (ref 15–41)
Albumin: 2.8 g/dL — ABNORMAL LOW (ref 3.5–5.0)
Alkaline Phosphatase: 70 U/L (ref 38–126)
BILIRUBIN TOTAL: 0.4 mg/dL (ref 0.3–1.2)
BUN: 12 mg/dL (ref 6–20)
CO2: 23 mmol/L (ref 22–32)
Calcium: 8.8 mg/dL — ABNORMAL LOW (ref 8.9–10.3)
Chloride: 103 mmol/L (ref 101–111)
Creatinine, Ser: 0.72 mg/dL (ref 0.44–1.00)
Glucose, Bld: 112 mg/dL — ABNORMAL HIGH (ref 65–99)
POTASSIUM: 3.6 mmol/L (ref 3.5–5.1)
Sodium: 139 mmol/L (ref 135–145)
TOTAL PROTEIN: 6.8 g/dL (ref 6.5–8.1)

## 2015-12-13 MED ORDER — SODIUM CHLORIDE 0.9% FLUSH
10.0000 mL | INTRAVENOUS | Status: DC | PRN
  Administered 2015-12-13 (×2): 10 mL
  Filled 2015-12-13 (×2): qty 40

## 2015-12-13 MED ORDER — SENNA 8.6 MG PO TABS
2.0000 | ORAL_TABLET | Freq: Every day | ORAL | Status: DC
  Administered 2015-12-13 – 2016-01-09 (×28): 17.2 mg via ORAL
  Filled 2015-12-13 (×28): qty 2

## 2015-12-13 NOTE — Progress Notes (Signed)
Orthopedic Tech Progress Note Patient Details:  [name] Xxxroyster January 30, 1981 [id number]  Ortho Devices Type of Ortho Device: Ankle Air splint Ortho Device/Splint Location: RLE at bedside Ortho Device/Splint Interventions: Ordered   Jennye MoccasinHughes, Jehan Ranganathan Craig 12/13/2015, 4:02 PM
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
## 2015-12-13 NOTE — Progress Notes (Signed)
Orthopedic Tech Progress Note Patient Details:  Sara [name] 31-Dec-1980 [id number]  [name], female   DOB: 31-Dec-1980, 35 y.o.   MRN: [id number] Called in advanced brace order; spoke with Ferdinand LangoKanisha  Sara [name] 12/13/2015, 10:11 AM
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
## 2015-12-13 NOTE — Care Management Note (Signed)
Inpatient Rehabilitation Center Individual Statement of Services  Patient Name:  Sara [name]  Date:  12/13/2015  Welcome to the Inpatient Rehabilitation Center.  Our goal is to provide you with an individualized program based on your diagnosis and situation, designed to meet your specific needs.  With this comprehensive rehabilitation program, you will be expected to participate in at least 3 hours of rehabilitation therapies Monday-Friday, with modified therapy programming on the weekends.  Your rehabilitation program will include the following services:  Physical Therapy (PT), Occupational Therapy (OT), Speech Therapy (ST), 24 hour per day rehabilitation nursing, Therapeutic Recreaction (TR), Neuropsychology, Case Management (Social Worker), Rehabilitation Medicine, Nutrition Services and Pharmacy Services  Weekly team conferences will be held on Tuesdays to discuss your progress.  Your Social Worker will talk with you frequently to get your input and to update you on team discussions.  Team conferences with you and your family in attendance may also be held.  Expected length of stay: 3-4 weeks  Overall anticipated outcome: min assist @ wheelchair  Depending on your progress and recovery, your program may change. Your Social Worker will coordinate services and will keep you informed of any changes. Your Social Worker's name and contact numbers are listed  below.  The following services may also be recommended but are not provided by the Inpatient Rehabilitation Center:   Driving Evaluations  Home Health Rehabiltiation Services  Outpatient Rehabilitation Services  Vocational Rehabilitation   Arrangements will be made to provide these services after discharge if needed.  Arrangements include referral to agencies that provide these services.  Your insurance has been verified to be:  Medicare and Medicaid Your primary doctor is:  Dr. Tanya NonesPickard  Pertinent information will be shared  with your doctor and your insurance company.  Social Worker:  [place], [place] [phone] or ([phone]) [phone]   Information discussed with and copy given to patient by: Amada JupiterHOYLE, Chyrl Elwell, 12/13/2015, 3:28 PM
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
## 2015-12-13 NOTE — Progress Notes (Signed)
Speech Language Pathology Daily Session Note  Patient Details  Name: Sara [name] MRN: [id number] Date of Birth: 1981/05/23  Today's Date: 12/13/2015 SLP Individual Time: 1500-1530 SLP Individual Time Calculation (min): 30 min  Short Term Goals: Week 1: SLP Short Term Goal 1 (Week 1): Pt demonstrate tolerance of Dys 3 consistencies to indicate readiness for PO upgrade at mod I. SLP Short Term Goal 2 (Week 1): Pt follow 2-step directives at mod I with 100% acc. SLP Short Term Goal 3 (Week 1): Pt demonstrate attention to R during functional tasks with min A. SLP Short Term Goal 4 (Week 1): Pt complete basic functional math tasks with min A. SLP Short Term Goal 5 (Week 1): Pt complete moderate level reasoning tasks with min A  Skilled Therapeutic Interventions: Skilled treatment session focused on cognitive goals. Upon arrival, patient had initiated the use of the call bell and requested to use the bathroom. [facility] with +2 assist for safety and required Mod A verbal cues to attend to right field of environment throughout the task. Patient also participated in a functional conversation with extra time and supervision verbal cues throughout task. Patient transferred back to bed and left with all needs within reach and bed alarm on.    Function:   Cognition Comprehension Comprehension assist level: Understands basic 75 - 89% of the time/ requires cueing 10 - 24% of the time  Expression   Expression assist level: Expresses basic 75 - 89% of the time/requires cueing 10 - 24% of the time. Needs helper to occlude trach/needs to repeat words.  Social Interaction Social Interaction assist level: Interacts appropriately 50 - 74% of the time - May be physically or verbally inappropriate.  Problem Solving Problem solving assist level: Solves basic 75 - 89% of the time/requires cueing 10 - 24% of the time  Memory Memory assist level: Recognizes or recalls 75  - 89% of the time/requires cueing 10 - 24% of the time    Pain Pain Assessment Pain Assessment: No/denies pain  Therapy/Group: Individual Therapy  Chana Lindstrom 12/13/2015, 4:03 PM

## 2015-12-13 NOTE — IPOC Note (Signed)
Overall Plan of Care [facility]) Patient Details Name: Sara [name] MRN: [id number] DOB: 1981-02-26  Admitting Diagnosis: TBI  Hospital Problems: Active Problems:   TBI (traumatic brain injury) (HCC)     Functional Problem List: Nursing Edema, Endurance, Medication Management, Motor, Pain, Nutrition, Safety, Sensory, Skin Integrity  PT Balance, Behavior, Edema, Endurance, Motor, Nutrition, Pain, Perception, Safety, Sensory  OT Balance, Cognition, Endurance, Motor, Vision, Safety, Sensory, Perception  SLP Cognition, Nutrition  TR         Basic ADL's: OT Grooming, Bathing, Dressing, Toileting     Advanced  ADL's: OT       Transfers: PT Bed Mobility, Bed to Chair, Car, Occupational psychologist, Research scientist (life sciences): PT Ambulation, Psychologist, prison and probation services, Stairs     Additional Impairments: OT Fuctional Use of Upper Extremity  SLP Swallowing, Social Cognition, Production assistant, radio, Memory, Attention, Awareness  TR      Anticipated Outcomes Item Anticipated Outcome  Self Feeding I  Swallowing  mod I regular diet   Basic self-care  min A  Toileting  min A   Bathroom Transfers min A  Bowel/Bladder  mod I assist  Transfers  min A  Locomotion  min A wheelchair level  Communication  mod I, complex  Cognition  mod I- min A complex  Pain  less than 4  Safety/Judgment  supervision   Therapy Plan: PT Intensity: Minimum of 1-2 x/day ,45 to 90 minutes PT Frequency: 5 out of 7 days PT Duration Estimated Length of Stay: 3-4 weeks OT Intensity: Minimum of 1-2 x/day, 45 to 90 minutes OT Frequency: 5 out of 7 days OT Duration/Estimated Length of Stay: 28-30 days SLP Intensity: Minumum of 1-2 x/day, 30 to 90 minutes SLP Frequency: 3 to 5 out of 7 days SLP Duration/Estimated Length of Stay: 24 days       Team Interventions: Nursing Interventions Patient/Family Education, Pain Management, Medication Management, Skin Care/Wound Management, Cognitive  Remediation/Compensation, Dysphagia/Aspiration Precaution Training, Discharge Planning, Psychosocial Support  PT interventions Ambulation/gait training, Warden/ranger, Cognitive remediation/compensation, Community reintegration, Discharge planning, Disease management/prevention, DME/adaptive equipment instruction, Functional electrical stimulation, Functional mobility training, Neuromuscular re-education, Pain management, Patient/family education, Splinting/orthotics, Psychosocial support, Stair training, Therapeutic Activities, Therapeutic Exercise, UE/LE Coordination activities, UE/LE Strength taining/ROM, Wheelchair propulsion/positioning  OT Interventions Warden/ranger, Cognitive remediation/compensation, Discharge planning, DME/adaptive equipment instruction, Functional mobility training, Neuromuscular re-education, Psychosocial support, Patient/family education, Self Care/advanced ADL retraining, Therapeutic Activities, Therapeutic Exercise, UE/LE Strength taining/ROM, UE/LE Coordination activities, Visual/perceptual remediation/compensation  SLP Interventions Cognitive remediation/compensation, Speech/Language facilitation, Functional tasks, Therapeutic Activities, Cueing hierarchy, Therapeutic Exercise, Medication managment, Patient/family education, Dysphagia/aspiration precaution training  TR Interventions    SW/CM Interventions Discharge Planning, Psychosocial Support, Patient/Family Education    Team Discharge Planning: Destination: PT-Home ,OT- Home , SLP-Skilled Nursing Facility (SNF) (TBD) Projected Follow-up: PT-Home health PT, 24 hour supervision/assistance, OT-  Home health OT, SLP- (pending discharge disposition, ongoing SLP) Projected Equipment Needs: PT-To be determined, OT- Tub/shower seat, 3 in 1 bedside comode, SLP-None recommended by SLP Equipment Details: PT- , OT-  Patient/family involved in discharge planning: PT- Patient,  OT-Patient,  SLP-Patient  MD ELOS: 24-27 days Medical Rehab Prognosis:  Excellent Assessment: The patient has been admitted for CIR therapies with the diagnosis of TBI. The team will be addressing functional mobility, strength, stamina, balance, safety, adaptive techniques and equipment, self-care, bowel and bladder mgt, patient and caregiver education, NMR, spasticity mgt, orthotics, cognition, behavior, pain mgt, community reintegration, ego support. Goals have been set  at min assist with mobility and self-care and mod I to supervision with cognition.    Ranelle OysterZachary T. Swartz, MD, FAAPMR      See Team Conference Notes for weekly updates to the plan of care

## 2015-12-14 ENCOUNTER — Inpatient Hospital Stay (HOSPITAL_COMMUNITY): Payer: Medicare Other | Admitting: Speech Pathology

## 2015-12-14 ENCOUNTER — Inpatient Hospital Stay (HOSPITAL_COMMUNITY): Payer: Medicare Other

## 2015-12-14 ENCOUNTER — Inpatient Hospital Stay (HOSPITAL_COMMUNITY): Payer: Self-pay

## 2015-12-14 ENCOUNTER — Inpatient Hospital Stay (HOSPITAL_COMMUNITY): Payer: Medicare Other | Admitting: Occupational Therapy

## 2015-12-14 DIAGNOSIS — G8111 Spastic hemiplegia affecting right dominant side: Secondary | ICD-10-CM | POA: Insufficient documentation

## 2015-12-14 DIAGNOSIS — R609 Edema, unspecified: Secondary | ICD-10-CM

## 2015-12-14 DIAGNOSIS — M6249 Contracture of muscle, multiple sites: Secondary | ICD-10-CM

## 2015-12-14 DIAGNOSIS — R1310 Dysphagia, unspecified: Secondary | ICD-10-CM

## 2015-12-14 MED ORDER — BACLOFEN 10 MG PO TABS
10.0000 mg | ORAL_TABLET | Freq: Three times a day (TID) | ORAL | Status: DC
  Administered 2015-12-14 – 2015-12-19 (×15): 10 mg via ORAL
  Filled 2015-12-14 (×15): qty 1
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
## 2015-12-14 NOTE — Progress Notes (Signed)
Occupational Therapy Session Note  Patient Details  Name: Sara [name] MRN: [id number] Date of Birth: Dec 27, 1980  Today's Date: 12/14/2015 OT Individual Time: [id number] OT Individual Time Calculation (min): 60 min    Short Term Goals: Week 1:  OT Short Term Goal 1 (Week 1): Pt will complete squat pivot transfers to max A of 1. OT Short Term Goal 2 (Week 1): Pt will don shirt with min A. OT Short Term Goal 3 (Week 1): Pt will don pants with mod A. OT Short Term Goal 4 (Week 1): Pt will perform RUE self ROM with mod cues. OT Short Term Goal 5 (Week 1): Pt will sit to stand at sink with max A of 1.  Skilled Therapeutic Interventions/Progress Updates:    OT session focused on activity tolerance, postural control, body awareness, and ADL retraining. Pt received supine in bed. Completed LB dressing and hygiene at bed level with pt completing rolling to right with min A and rolling to L with max A. Transitioned to w/c with use of stedy. Pt required mod A to pull to standing in stedy, remaining in partial standing for 10 minutes while completing grooming tasks. Pt demonstrated R lead in stedy, requiring verbal and visual cues (mirror) and physical assist to achieve midline. Pt demonstrated 70% accuracy for locating body parts during grooming. Completed UB dressing and bathing from w/c with OT providing verbal and visual cues to attend to right side of body. Pt demonstrated 60% accuracy for identification of body parts during bathing and dressing. Pt left sitting in w/c with half lap tray and all needs in reach.   Therapy Documentation Precautions:  Precautions Precautions: Fall Precaution Comments: R lean, L ankle instability Required Braces or Orthoses: Other Brace/Splint (hand splint and PRAFO at night) Restrictions Weight Bearing Restrictions: No General:   Vital Signs:  Pain:   ADL: ADL ADL Comments: refer to functional navigator Exercises:   Other Treatments:    See Function  Navigator for Current Functional Status.   Therapy/Group: Individual Therapy  Daneil Danerkinson, Rayshaun Needle N 12/14/2015, 11:55 AM

## 2015-12-14 NOTE — Progress Notes (Signed)
Physical Therapy Note  Patient Details  Name: [name] Xxxroyster MRN: [id number] Date of Birth: 05-Sep-1980 Today's Date: 12/14/2015  1420-1450, 30 min individual  No pain reported.  Pt wearing L ankle air cast. neuromuscular re-education via forced use, positioning, manual cues for bil heel cord stretching before standing.  Standing in parallel bars with LUE support during R><L wt shifting with max assist for R knee control, upright trunk, wt shift to R.  No activity noted RLE.  +2 gait for single R/L steps, with therapist under pt's RUE.  Pt's R knee buckled despite assistance at knee. Kinetron in sitting in w/c focusing on alternating movements.   Pt left resting in room in w/c, with all needs within reach. Elaiza Shoberg 12/14/2015, 12:50 PM

## 2015-12-14 NOTE — Progress Notes (Signed)
Speech Language Pathology Daily Session Note  Patient Details  Name: Sara [name] MRN: [id number] Date of Birth: 1980-08-26  Today's Date: 12/14/2015 SLP Individual Time: [id number] SLP Individual Time Calculation (min): 45 min  Short Term Goals: Week 1: SLP Short Term Goal 1 (Week 1): Pt demonstrate tolerance of Dys 3 consistencies to indicate readiness for PO upgrade at mod I. SLP Short Term Goal 2 (Week 1): Pt follow 2-step directives at mod I with 100% acc. SLP Short Term Goal 3 (Week 1): Pt demonstrate attention to R during functional tasks with min A. SLP Short Term Goal 4 (Week 1): Pt complete basic functional math tasks with min A. SLP Short Term Goal 5 (Week 1): Pt complete moderate level reasoning tasks with min A  Skilled Therapeutic Interventions: Skilled treatment session focused on dysphagia and cognitive goals. SLP facilitated session by providing trials of dysphagia 3 textures. Pt with good mastication, mildly extended oral prep time but good oral clearing. Pt will Mod I for rate and bite size. Pt followed 2 step directives with Min A verbal cues that faded to Mod I throughout task. Pt with good initiation of new mildly complex reasoning task. Pt with Min A verbal cues for correct task completion. Pt required mod A verbal and tactial cues for attention to R during functional tasks. Pt was left in bed with alarm set and all needs within reach. Continue current plan of care.   Function:  Eating Eating   Modified Consistency Diet: Yes Eating Assist Level: More than reasonable amount of time;Set up assist for;Supervision or verbal cues   Eating Set Up Assist For: Opening containers;Cutting food       Cognition Comprehension Comprehension assist level: Understands basic 75 - 89% of the time/ requires cueing 10 - 24% of the time  Expression   Expression assist level: Expresses basic 75 - 89% of the time/requires cueing 10 - 24% of the time. Needs helper to occlude  trach/needs to repeat words.  Social Interaction Social Interaction assist level: Interacts appropriately 50 - 74% of the time - May be physically or verbally inappropriate.  Problem Solving Problem solving assist level: Solves basic 50 - 74% of the time/requires cueing 25 - 49% of the time  Memory Memory assist level: Recognizes or recalls 75 - 89% of the time/requires cueing 10 - 24% of the time    Pain    Therapy/Group: Individual Therapy  Storm Dulski 12/14/2015, 10:19 AM
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
## 2015-12-15 NOTE — Progress Notes (Signed)
RT placed patient on CPAP HS. Patient placed on auto 20/5. Patient tolerating well. RT will continue to monitor as needed.
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
## 2015-12-16 ENCOUNTER — Inpatient Hospital Stay (HOSPITAL_COMMUNITY): Payer: Self-pay | Admitting: Physical Therapy

## 2015-12-16 ENCOUNTER — Inpatient Hospital Stay (HOSPITAL_COMMUNITY): Payer: Medicare Other | Admitting: Occupational Therapy

## 2015-12-16 ENCOUNTER — Inpatient Hospital Stay (HOSPITAL_COMMUNITY): Payer: Self-pay

## 2015-12-16 ENCOUNTER — Inpatient Hospital Stay (HOSPITAL_COMMUNITY): Payer: Medicare Other | Admitting: Speech Pathology

## 2015-12-16 DIAGNOSIS — S069X2S Unspecified intracranial injury with loss of consciousness of 31 minutes to 59 minutes, sequela: Secondary | ICD-10-CM
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
## 2015-12-16 NOTE — Progress Notes (Signed)
Physical Therapy Session Note  Patient Details  Name: Sara [name] Xxxroyster MRN: [id number] Date of Birth: 1981-06-26  Today's Date: 12/16/2015 PT Individual Time: 1115-1159 PT Individual Time Calculation (min): 44 min   Short Term Goals: Week 1:  PT Short Term Goal 1 (Week 1): Patient will perform bed <> wheelchair transfers with mod A x 2.  PT Short Term Goal 2 (Week 1): Patient will maintain dynamic standing balance x 1 min with max A.  PT Short Term Goal 3 (Week 1): Patient will perform bed mobility with max A and mod verbal cues for technique.  PT Short Term Goal 4 (Week 1): Patient will perform pre-gait activities with assist of 1 person.  PT Short Term Goal 5 (Week 1): Patient will maintain dynamic sitting balance x 5 min with supervision.   Skilled Therapeutic Interventions/Progress Updates:   PT donned R ankle air cast, bil shoes.  Pt assisted with crossing LLE over R and pressing heel into shoe. Therapeutic activity high sit>< stand using Stedy, focusing on forward wt shifting.  In [place], pt completed PVC pipe tree activity with min assist to scan R for correct pieces.  neuromuscular re-education via forced use, manual placement for R open- hand wt bearing on table in front of her, and = wt bearing in standing with upright trunk and forward gaze.  Hand over hand assistance provided for pt to grasp and release PVC pieces with R hand, visually attend to R.  Pt left resting in w/[name] with all needs in place and sister in room.    Therapy Documentation Precautions:  Precautions Precautions: Fall Precaution Comments: R lean, L ankle instability Required Braces or Orthoses: Other Brace/Splint (hand splint and PRAFO at night) Restrictions Weight Bearing Restrictions: No  Pain: Pain Assessment Pain Assessment: No/denies pain Pain Score: 0-No pain       See Function Navigator for Current Functional Status.   Therapy/Group: Individual Therapy  Kenlynn Houde 12/16/2015,  12:02 PM

## 2015-12-16 NOTE — Progress Notes (Signed)
Occupational Therapy Session Note  Patient Details  Name: Sara [name] MRN: [id number] Date of Birth: 1981/02/24  Today's Date: 12/16/2015 OT Individual Time: 1300-1414 OT Individual Time Calculation (min): 74 min    Short Term Goals: Week 1:  OT Short Term Goal 1 (Week 1): Pt will complete squat pivot transfers to max A of 1. OT Short Term Goal 2 (Week 1): Pt will don shirt with min A. OT Short Term Goal 3 (Week 1): Pt will don pants with mod A. OT Short Term Goal 4 (Week 1): Pt will perform RUE self ROM with mod cues. OT Short Term Goal 5 (Week 1): Pt will sit to stand at sink with max A of 1.  Skilled Therapeutic Interventions/Progress Updates:   Upon entering the room, pt seated in wheelchair awaiting therapist with no c/o pain this session. Pt propelled wheelchair with L UE with max A for safety and direction as feet are unable to touch floor. OT provided paper handout for R UE self ROM exercises. Pt scanning paper and reading instructions for each exercise. OT then performed demonstration and pt given increased time and min verbal cues for proper exercise. Pt performed 5 reps of each exercise for shoulder,elbow,wrist, and hand in all planes of movement. Pt returned to room at end of session in same manner as above. Pt remained in wheelchair with call bell and all needed items within reach upon exiting the room.   Therapy Documentation Precautions:  Precautions Precautions: Fall Precaution Comments: R lean, L ankle instability Required Braces or Orthoses: Other Brace/Splint (hand splint and PRAFO at night) Restrictions Weight Bearing Restrictions: No ADL: ADL ADL Comments: refer to functional navigator  See Function Navigator for Current Functional Status.   Therapy/Group: Individual Therapy  Lowella Gripittman, Hareem Surowiec L 12/16/2015, 2:16 PM

## 2015-12-16 NOTE — Progress Notes (Signed)
Occupational Therapy Session Note  Patient Details  Name: Sara [name] MRN: [id number] Date of Birth: 07-22-1981  Today's Date: 12/16/2015 OT Individual Time:  -  0900-10000      Short Term Goals: Week 1:  OT Short Term Goal 1 (Week 1): Pt will complete squat pivot transfers to max A of 1. OT Short Term Goal 2 (Week 1): Pt will don shirt with min A. OT Short Term Goal 3 (Week 1): Pt will don pants with mod A. OT Short Term Goal 4 (Week 1): Pt will perform RUE self ROM with mod cues. OT Short Term Goal 5 (Week 1): Pt will sit to stand at sink with max A of 1.    Skilled Therapeutic Interventions/Progress Updates:    Pt lying in bed. Addressed bed mobility and sit to stand using the stedy. And RUE NMRE.  Pt went from supine to sit with min assist and scooting to EOB with max assist.  Used stedy to go from sit to stand while performing bathing at sink.   Pt able to lift right leg with max assist and don pants with total assist.  Pt able to take some weight on RLE to don pants on left leg.  Pt transferred to wc.  Performed UB bathing at sink with assist moving RIUE so pt could wash under arm.  Pt undressed/dressed shirt with noted ideational apraxia with activity.  Engaged in RUE NMRE at sink with washing hand under warm water.  Hand went from fisted position to increased open position.  Provided blood removal form parts of hair.   Pt.left in room with all items in reach and safety belt on..    Therapy Documentation Precautions:  Precautions Precautions: Fall Precaution Comments: R lean, L ankle instability Required Braces or Orthoses: Other Brace/Splint (hand splint and PRAFO at night) Restrictions Weight Bearing Restrictions: No    Vital Signs: Therapy Vitals Temp: 97.7 F (36.5 C) Temp Source: Oral Pulse Rate: 79 Resp: 18 BP: (!) 112/55 mmHg Patient Position (if appropriate): Lying Oxygen Therapy SpO2: 100 % O2 Device: CPAP Pain:  none   ADL: ADL ADL Comments:  refer to functional navigator       See Function Navigator for Current Functional Status.   Therapy/Group: Individual Therapy  Humberto Sealsdwards, Ashlie Mcmenamy J 12/16/2015, 7:54 AM

## 2015-12-16 NOTE — Progress Notes (Signed)
Physical Therapy Session Note  Patient Details  Name: Sara [name] MRN: [id number] Date of Birth: [birth year]  Today's Date: 12/16/2015 PT Individual Time: 1506-1600 PT Individual Time Calculation (min): 54 min   Short Term Goals: Week 1:  PT Short Term Goal 1 (Week 1): Patient will perform bed <> wheelchair transfers with mod A x 2.  PT Short Term Goal 2 (Week 1): Patient will maintain dynamic standing balance x 1 min with max A.  PT Short Term Goal 3 (Week 1): Patient will perform bed mobility with max A and mod verbal cues for technique.  PT Short Term Goal 4 (Week 1): Patient will perform pre-gait activities with assist of 1 person.  PT Short Term Goal 5 (Week 1): Patient will maintain dynamic sitting balance x 5 min with supervision.   Skilled Therapeutic Interventions/Progress Updates:   Pt received in w/c with air cast donned on RLE.  Pt transported to gym and performed transfer w/c > Nustep with squat pivot over the back method to maintain forward lean and weight shift to L with +2 A due to pt unable to fully clear buttocks over Nustep seat and pushing Nustep over-required +2 to lift and position buttocks on seat and to scoot posterior.  During transfer back to w/c placed block under feet to allow improved activation and use of LE-performed squat pivot to w/c with max A and improved ability to lift and clear buttocks.  Performed NMR on Nustep, in hall way and on stairs; see below.  Returned to room and performed squat pivot w/c > bed with max A over the back method with block under feet and pt initially unable to clear buttocks but when cued to come to a half stand pt better able to lift and pivot buttocks to bed and scoot along bed.  Required mod A for sit > supine and to bridge in bed to reposition hips.  Pt left in bed with all items within reach.   Therapy Documentation Precautions:  Precautions Precautions: Fall Precaution Comments: R lean, L ankle instability Required Braces  or Orthoses: Other Brace/Splint (hand splint and PRAFO at night) Restrictions Weight Bearing Restrictions: No Vital Signs: Therapy Vitals Temp: 98 F (36.7 C) Temp Source: Oral Pulse Rate: 82 Resp: 18 BP: 128/74 mmHg Patient Position (if appropriate): Sitting Oxygen Therapy SpO2: 100 % O2 Device: Not Delivered Pain: Pain Assessment Pain Assessment: No/denies pain Other Treatments: Treatments Neuromuscular Facilitation: Right;Upper Extremity;Lower Extremity;Forced use;Activity to increase coordination;Activity to increase motor control;Activity to increase timing and sequencing;Activity to increase sustained activation;Activity to increase grading;Activity to increase lateral weight shifting;Activity to increase anterior-posterior weight shifting on Nustep with bilat UE and LE x 6 minutes for reciprocal UE and LE activation and coordination +2 minutes with LE only to focus on extension activation in RLE.  Transitioned to hallway where pt performed sit <> stand with mod-max A, dynamic standing and lateral, anterior<>posterior weight shifting and WB through RLE during LLE forwards and backwards stepping, gait x 25' with LUE support on wall rail as well as one step negotiation forwards to ascend, backwards to descend and therapist on R side providing facilitation at trunk for L trunk elongation, weight shifting and stabilization at RLE in stance with tech assistance to overcome extension and ADD tone in RLE to advance R.  Some quad activation noted in RLE during stance.   See Function Navigator for Current Functional Status.   Therapy/Group: Individual Therapy  Sara CircleHall, Peyson [name] Sara [name] 12/16/2015, 4:56 PM

## 2015-12-16 NOTE — Progress Notes (Signed)
Speech Language Pathology Daily Session Note  Patient Details  Name: Sara [name] MRN: [id number] Date of Birth: Jun 20, 1981  Today's Date: 12/16/2015 SLP Individual Time: 1000-1030 SLP Individual Time Calculation (min): 30 min  Short Term Goals: Week 1: SLP Short Term Goal 1 (Week 1): Pt demonstrate tolerance of Dys 3 consistencies to indicate readiness for PO upgrade at mod I. SLP Short Term Goal 2 (Week 1): Pt follow 2-step directives at mod I with 100% acc. SLP Short Term Goal 3 (Week 1): Pt demonstrate attention to R during functional tasks with min A. SLP Short Term Goal 4 (Week 1): Pt complete basic functional math tasks with min A. SLP Short Term Goal 5 (Week 1): Pt complete moderate level reasoning tasks with min A  Skilled Therapeutic Interventions: Skilled treatment session focused on dysphagia and cognitive-linguistic goals. SLP facilitated session by providing skilled observation with trials of Dys. 3 textures. Patient demonstrated efficient mastication with minimal oral residue that cleared with a liquid wash. Patient consumed trials without overt s/s of aspiration, therefore, recommend upgrade to Dys. 3 textures. SLP also facilitated session by providing Min A question cues and verbal cues for verbal reasoning and specificity with a structured, mildly complex verbal description task. Patient left upright in wheelchair with all needs within reach. Continue with current plan of care.    Function:  Eating Eating   Modified Consistency Diet: Yes Eating Assist Level: Set up assist for   Eating Set Up Assist For: Opening containers       Cognition Comprehension Comprehension assist level: Understands basic 75 - 89% of the time/ requires cueing 10 - 24% of the time  Expression   Expression assist level: Expresses basic 75 - 89% of the time/requires cueing 10 - 24% of the time. Needs helper to occlude trach/needs to repeat words.  Social Interaction Social Interaction  assist level: Interacts appropriately 90% of the time - Needs monitoring or encouragement for participation or interaction.  Problem Solving Problem solving assist level: Solves basic 75 - 89% of the time/requires cueing 10 - 24% of the time  Memory Memory assist level: Recognizes or recalls 75 - 89% of the time/requires cueing 10 - 24% of the time    Pain Pain Assessment Pain Assessment: No/denies pain Pain Score: 0-No pain  Therapy/Group: Individual Therapy  Olivine Hiers 12/16/2015, 10:33 AM

## 2015-12-17 ENCOUNTER — Inpatient Hospital Stay (HOSPITAL_COMMUNITY): Payer: Medicare Other | Admitting: Speech Pathology

## 2015-12-17 ENCOUNTER — Inpatient Hospital Stay (HOSPITAL_COMMUNITY): Payer: Medicare Other | Admitting: Occupational Therapy

## 2015-12-17 ENCOUNTER — Inpatient Hospital Stay (HOSPITAL_COMMUNITY): Payer: Medicare Other

## 2015-12-17 ENCOUNTER — Inpatient Hospital Stay (HOSPITAL_COMMUNITY): Payer: Medicare Other | Admitting: Physical Therapy

## 2015-12-17 NOTE — Progress Notes (Signed)
Physical Therapy Session Note  Patient Details  Name: Sara [name] MRN: [id number] Date of Birth: 10-Oct-1980  Today's Date: 12/17/2015 PT Individual Time: 1550-1630 PT Individual Time Calculation (min): 40 min   Short Term Goals: Week 1:  PT Short Term Goal 1 (Week 1): Patient will perform bed <> wheelchair transfers with mod A x 2.  PT Short Term Goal 2 (Week 1): Patient will maintain dynamic standing balance x 1 min with max A.  PT Short Term Goal 3 (Week 1): Patient will perform bed mobility with max A and mod verbal cues for technique.  PT Short Term Goal 4 (Week 1): Patient will perform pre-gait activities with assist of 1 person.  PT Short Term Goal 5 (Week 1): Patient will maintain dynamic sitting balance x 5 min with supervision.   Skilled Therapeutic Interventions/Progress Updates:    Pt received upright in w/c with no c/o pain and agreeable to therapy session.  Squat/pivot from w/c>therapy mat with max assist and pt noted to be incontinent of urine.  Squat/pivot back to w/c with max assist and multimodal cues for hand placement.  Squat/pivot from w/c>bed with max assist and sit>supine with mod assist.  PT instructed pt in rolling to R and bridging for removal of soiled pants/brief, pericare, and donning clean brief and shorts.  Pt able to roll with supervision and verbal cues and able to bridge with therapist facilitating weight bearing through LLE.  PT instructed pt in PROM stretching for hamstrings, glutes, piriformis, and hip flexors for tone management.  PT instructed pt in LE therex focus on overflow muscle activation for quad sets and glute sets with noted activation of LLE adductors with glute set.  Pt left supine in bed at end of session with call bell in reach and needs met.   Therapy Documentation Precautions:  Precautions Precautions: Fall Precaution Comments: R lean, L ankle instability Required Braces or Orthoses: Other Brace/Splint (hand splint and PRAFO at  night) Restrictions Weight Bearing Restrictions: No   See Function Navigator for Current Functional Status.   Therapy/Group: Individual Therapy  Earnest Conroy Penven-Crew 12/17/2015, 4:57 PM

## 2015-12-17 NOTE — Progress Notes (Signed)
Occupational Therapy Session Note  Patient Details  Name: Sara [name] MRN: [id number] Date of Birth: 1980/09/04  Today's Date: 12/17/2015 OT Individual Time: 1100-1200 OT Individual Time Calculation (min): 60 min    Short Term Goals: Week 1:  OT Short Term Goal 1 (Week 1): Pt will complete squat pivot transfers to max A of 1. OT Short Term Goal 2 (Week 1): Pt will don shirt with min A. OT Short Term Goal 3 (Week 1): Pt will don pants with mod A. OT Short Term Goal 4 (Week 1): Pt will perform RUE self ROM with mod cues. OT Short Term Goal 5 (Week 1): Pt will sit to stand at sink with max A of 1.  Skilled Therapeutic Interventions/Progress Updates:    Pt resting in w/c upon arrival.  Pt stated that she had already changed her pants with RN and during earlier therapy, but she was agreeable to UB bathing and dressing retraining.  Hemi bathing techniques demonstrated for pt.  Pt required hand over hand assistance to complete bathing techniques.  Pt recalled hemi dressing techniques but required min verbal cues for thoroughness. Pt transitioned to RUE NMR while seated in w/c.  Pt requires tot A for all RUE movements with increased tone noted in hand.  Pt engaged in table activities with focus on attending to R and locating items in R field of vision.  Pt also engaged in Dyn[employer] activities while seated with a reaction time on L .25 seconds less than R.  Pt incorporates compensatory strategies when locating items on right.    Therapy Documentation Precautions:  Precautions Precautions: Fall Precaution Comments: R lean, L ankle instability Required Braces or Orthoses: Other Brace/Splint (hand splint and PRAFO at night) Restrictions Weight Bearing Restrictions: No  Pain: Pain Assessment Pain Assessment: No/denies pain Pain Score: 0-No pain ADL: ADL ADL Comments: refer to functional navigator  See Function Navigator for Current Functional Status.   Therapy/Group: Individual  Therapy  [name], [name] 12/17/2015, 12:15 PM
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
## 2015-12-17 NOTE — Progress Notes (Signed)
Occupational Therapy Session Note  Patient Details  Name: Sara [name] MRN: [id number] Date of Birth: 08/25/1980  Today's Date: 12/17/2015 OT Individual Time: 1345-1445 OT Individual Time Calculation (min): 60 min    Short Term Goals: Week 1:  OT Short Term Goal 1 (Week 1): Pt will complete squat pivot transfers to max A of 1. OT Short Term Goal 2 (Week 1): Pt will don shirt with min A. OT Short Term Goal 3 (Week 1): Pt will don pants with mod A. OT Short Term Goal 4 (Week 1): Pt will perform RUE self ROM with mod cues. OT Short Term Goal 5 (Week 1): Pt will sit to stand at sink with max A of 1.  Skilled Therapeutic Interventions/Progress Updates:    Pt seen for skilled OT to facilitate RUE, sitting balance, sit><stand, and standing motor control. Pt received in room in w/c ready for therapy. Pt taken to gym and worked on sq pivot to mat with max A using BLE well during forward wt shift. On mat, lateral wt shifts and trunk rotation to decrease RUE tone and increase trunk control. RUE wt bearing on mat through palm with LUE reaching across midline. Good visual scanning to the R and control of trunk. RUE facilitation with UE ranger and arm slides on table top. No active movement yet, but passive movement fluid. Pt asked to visualize her arm moving on its own during PROM.  Sit to stand from elevated mat with support through R knee and torso. Pt able to lean forward and come into stand with input through upper trunk. 2 x pt able to maintain static stand with max A with full upright posture for 2-3 minutes.  Transferred back to w/c and returned to room with all needs met.  Therapy Documentation Precautions:  Precautions Precautions: Fall Precaution Comments: R lean, L ankle instability Required Braces or Orthoses: Other Brace/Splint (hand splint and PRAFO at night) Restrictions Weight Bearing Restrictions: No  Pain: Pain Assessment Pain Assessment: No/denies pain Pain Score: 0-No  pain ADL: ADL ADL Comments: refer to functional navigator  See Function Navigator for Current Functional Status.   Therapy/Group: Individual Therapy  [place] 12/17/2015, 11:55 AM
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
## 2015-12-17 NOTE — Progress Notes (Signed)
Physical Therapy Session Note  Patient Details  Name: Sara [name] MRN: [id number] Date of Birth: [birth year]  Today's Date: 12/17/2015 PT Individual Time: 0800-0900 PT Individual Time Calculation (min): 60 min   Short Term Goals: Week 1:  PT Short Term Goal 1 (Week 1): Patient will perform bed <> wheelchair transfers with mod A x 2.  PT Short Term Goal 2 (Week 1): Patient will maintain dynamic standing balance x 1 min with max A.  PT Short Term Goal 3 (Week 1): Patient will perform bed mobility with max A and mod verbal cues for technique.  PT Short Term Goal 4 (Week 1): Patient will perform pre-gait activities with assist of 1 person.  PT Short Term Goal 5 (Week 1): Patient will maintain dynamic sitting balance x 5 min with supervision.   Skilled Therapeutic Interventions/Progress Updates:   Patient in bed upon arrival finishing breakfast with NT. Patient with R hand splint and R PRAFO donned with kickstand placed incorrectly resulting in RLE in externally rotated position with increased tightness and pain with repositioning to neutral. Patient and NT educated on importance of PRAFO to maintain RLE in neutral position, both verbalized understanding. Performed PROM RLE hip IR, hamstrings, hip abduction, hip adduction, heel cord. Patient noted to be incontinent of urine, performed rolling to right and left with max verbal/visual cues for sequencing/technique and min-mod A using rails, total A to wash bottom and patient able to wash front with setup to assist with hygiene, total A for clothing management but patient able to verbalize which LE to thread into pants first. Performed supine > sit using rail with mod A overall and max verbal cues for sequencing. Performed bed > wheelchair transfer via [place] with min  A overall, assist for eccentric control with lowering and patient propelled to/from gym using LUE with max A. Patient brushed teeth at wheelchair level with supervision. Instructed in  squat pivot transfer wheelchair <> level mat table with focus on head hips relationship and achieving bottom clearance with max A, improved bottom clearance when returning to wheelchair. Patient instructed in sit > partial stand with arm chair positioned in front of patient x 10 with assist to keep RUE on arm chair and mod A overall to work on bottom clearance with good carryover. Patient returned to room and left sitting in wheelchair with R tray table in place and needs in reach.    Therapy Documentation Precautions:  Precautions Precautions: Fall Precaution Comments: R lean, L ankle instability Required Braces or Orthoses: Other Brace/Splint (hand splint and PRAFO at night) Restrictions Weight Bearing Restrictions: No Pain: Pain Assessment Pain Assessment: No/denies pain Pain Score: 0-No pain  See Function Navigator for Current Functional Status.   Therapy/Group: Individual Therapy  Kerney ElbeVarner, Geoffrey Hynes A 12/17/2015, 9:03 AM
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
## 2015-12-17 NOTE — Progress Notes (Signed)
Speech Language Pathology Daily Session Note  Patient Details  Name: Sara [name] MRN: [id number] Date of Birth: [birth year]  Today's Date: 12/17/2015 SLP Individual Time: 1015-1100 SLP Individual Time Calculation (min): 45 min  Short Term Goals: Week 1: SLP Short Term Goal 1 (Week 1): Pt demonstrate tolerance of Dys 3 consistencies to indicate readiness for PO upgrade at mod I. SLP Short Term Goal 2 (Week 1): Pt follow 2-step directives at mod I with 100% acc. SLP Short Term Goal 3 (Week 1): Pt demonstrate attention to R during functional tasks with min A. SLP Short Term Goal 4 (Week 1): Pt complete basic functional math tasks with min A. SLP Short Term Goal 5 (Week 1): Pt complete moderate level reasoning tasks with min A  Skilled Therapeutic Interventions: Skilled treatment session focused on dysphagia and cognitive-linguistic goals. SLP facilitated session with skilled observation with trials of regular textures. Patient demonstrated efficient mastication with complete oral clearance, therefore, recommend upgrade with continued full supervision to ensure safety. SLP also facilitated session by providing Min A verbal and visual cues for problem solving and emergent awareness of errors during a basic money management task. Patient left upright in wheelchair with all needs within reach. Continue with current plan of care.    Function:  Eating Eating   Modified Consistency Diet: No Eating Assist Level: Set up assist for;Swallowing techniques: self managed   Eating Set Up Assist For: Opening containers       Cognition Comprehension Comprehension assist level: Understands basic 90% of the time/cues < 10% of the time  Expression   Expression assist level: Expresses basic 90% of the time/requires cueing < 10% of the time.  Social Interaction Social Interaction assist level: Interacts appropriately with others with medication or extra time (anti-anxiety, antidepressant).  Problem  Solving Problem solving assist level: Solves basic 75 - 89% of the time/requires cueing 10 - 24% of the time  Memory Memory assist level: Recognizes or recalls 75 - 89% of the time/requires cueing 10 - 24% of the time    Pain Pain Assessment Pain Assessment: No/denies pain Pain Score: 0-No pain  Therapy/Group: Individual Therapy  Keeana Pieratt 12/17/2015, 12:19 PM

## 2015-12-18 ENCOUNTER — Inpatient Hospital Stay (HOSPITAL_COMMUNITY): Payer: Self-pay | Admitting: Physical Therapy

## 2015-12-18 ENCOUNTER — Inpatient Hospital Stay (HOSPITAL_COMMUNITY): Payer: Medicare Other | Admitting: Speech Pathology

## 2015-12-18 ENCOUNTER — Inpatient Hospital Stay (HOSPITAL_COMMUNITY): Payer: Self-pay | Admitting: *Deleted

## 2015-12-18 ENCOUNTER — Inpatient Hospital Stay (HOSPITAL_COMMUNITY): Payer: Medicare Other | Admitting: Physical Therapy

## 2015-12-18 ENCOUNTER — Inpatient Hospital Stay (HOSPITAL_COMMUNITY): Payer: Medicare Other | Admitting: Occupational Therapy
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
## 2015-12-18 NOTE — Progress Notes (Signed)
Occupational Therapy Session Note  Patient Details  Name: Sara [name] MRN: [id number] Date of Birth: February 27, 1981  Today's Date: 12/18/2015 OT Individual Time: 1100-1200 OT Individual Time Calculation (min): 60 min    Short Term Goals: Week 1:  OT Short Term Goal 1 (Week 1): Pt will complete squat pivot transfers to max A of 1. OT Short Term Goal 2 (Week 1): Pt will don shirt with min A. OT Short Term Goal 3 (Week 1): Pt will don pants with mod A. OT Short Term Goal 4 (Week 1): Pt will perform RUE self ROM with mod cues. OT Short Term Goal 5 (Week 1): Pt will sit to stand at sink with max A of 1.  Skilled Therapeutic Interventions/Progress Updates:    Pt seen for skilled OT to facilitate balance, adaptive strategies, and RUE management.  Pt received in w/c and excited to take a shower. Used sara stedy to transfer w/c to end of  tub bench. Pt then needed to pivot leg around to R to face into shower. Pt needed max A to shift hips and position self as tub bench was too high. (after shower tub bench lowered and positioned at a perpendicular angle so pt can just back up to it with stedy for next shower).  On bench, pt demonstrated good attention to R side of body and good trunk control in sitting. Sit to stand with mod A using grab bar.  Pt completed shower with mod A.  Clarise Cruz lift out of shower to w/c.  Focused on controlled movement and R leg stability with sit ><stand and standing balance.  Used bedrail on L side to assist with movement and stability.  Pt used adaptive strategies for dressing with mod cues and A.  Pt in w/c set up for lunch with all needs met.  Therapy Documentation Precautions:  Precautions Precautions: Fall Precaution Comments: R lean, L ankle instability Required Braces or Orthoses: Other Brace/Splint (hand splint and PRAFO at night) Restrictions Weight Bearing Restrictions: No     Pain: Pain Assessment Pain Assessment: No/denies pain Pain Score: 0-No  pain ADL: ADL ADL Comments: refer to functional navigator  See Function Navigator for Current Functional Status.   Therapy/Group: Individual Therapy  [place] 12/18/2015, 12:09 PM

## 2015-12-18 NOTE — Progress Notes (Signed)
Speech Language Pathology Daily Session Note  Patient Details  Name: Sara [name] MRN: [id number] Date of Birth: 11-21-1980  Today's Date: 12/18/2015 SLP Individual Time: 1000-1100 SLP Individual Time Calculation (min): 60 min  Short Term Goals: Week 1: SLP Short Term Goal 1 (Week 1): Pt demonstrate tolerance of Dys 3 consistencies to indicate readiness for PO upgrade at mod I. SLP Short Term Goal 2 (Week 1): Pt follow 2-step directives at mod I with 100% acc. SLP Short Term Goal 3 (Week 1): Pt demonstrate attention to R during functional tasks with min A. SLP Short Term Goal 4 (Week 1): Pt complete basic functional math tasks with min A. SLP Short Term Goal 5 (Week 1): Pt complete moderate level reasoning tasks with min A  Skilled Therapeutic Interventions: Skilled treatment session focused on cognitive goals. SLP facilitated session by providing Min A visual and question cues for complex problem solving and recall during a novel task. Patient attended to her right field of environment and demonstrated selective attention to task in a mildly distracting environment for ~45 minutes with Mod I. Patient also recalled events from previous therapy session with supervision question cues. Patient reported she feels she is tolerating her regular textures without difficulty, RN and NT are in agreement, therefore, patient changed to intermittent supervision with meals. Patient left upright in wheelchair with all needs within reach. Continue with current plan of care.    Function:    Cognition Comprehension Comprehension assist level: Understands basic 90% of the time/cues < 10% of the time  Expression   Expression assist level: Expresses basic 90% of the time/requires cueing < 10% of the time.  Social Interaction Social Interaction assist level: Interacts appropriately with others with medication or extra time (anti-anxiety, antidepressant).  Problem Solving Problem solving assist level:  Solves basic 75 - 89% of the time/requires cueing 10 - 24% of the time  Memory Memory assist level: Recognizes or recalls 75 - 89% of the time/requires cueing 10 - 24% of the time    Pain Pain Assessment Pain Assessment: No/denies pain Pain Score: 0-No pain  Therapy/Group: Individual Therapy  Lateasha Breuer 12/18/2015, 11:53 AM

## 2015-12-18 NOTE — Progress Notes (Signed)
Physical Therapy Session Note  Patient Details  Name: Sara [name] MRN: [id number] Date of Birth: Feb 04, 1981  Today's Date: 12/18/2015 PT Individual Time: 0900-1000 PT Individual Time Calculation (min): 60 min   Short Term Goals: Week 1:  PT Short Term Goal 1 (Week 1): Patient will perform bed <> wheelchair transfers with mod A x 2.  PT Short Term Goal 2 (Week 1): Patient will maintain dynamic standing balance x 1 min with max A.  PT Short Term Goal 3 (Week 1): Patient will perform bed mobility with max A and mod verbal cues for technique.  PT Short Term Goal 4 (Week 1): Patient will perform pre-gait activities with assist of 1 person.  PT Short Term Goal 5 (Week 1): Patient will maintain dynamic sitting balance x 5 min with supervision.   Skilled Therapeutic Interventions/Progress Updates:   Patient in bed upon arrival, performed hygiene with assist for bottom and patient able to cleanse front with setup assist. Performed rolling in bed with focus on sequencing and technique with min-mod A to don new brief, performed bridging with assist to stabilize BLE in hooklying position to pull pants over hips. Donned TED hose, R aircast, and shoes with total A. Patient instructed in rolling R with mod A to come to sitting EOB, max multimodal cues for reciprocal scooting to EOB and Bobath technique to facilitate anterior weight shift and lateral leans. Performed squat pivot transfer to wheelchair with max A and max multimodal cues for head hips relationship and bottom clearance. Patient propelled wheelchair to gym using LUE only with max A for steering. Gait training using L rail in hallway with mirror for visual feedback with max A to advance/place RLE with increased tone and max multimodal cues for upright posture, decreased forward lean, advancing LUE along rail, and sequencing step-through pattern x 20 ft + 10 ft with +2 for wheelchair follow for safety. Stair training up/down one 6" step using L  rail with verbal/visual cues for step-to sequencing and total A to place/stabilize RLE, ascending leading with LLE and descending leading with RLE with increased difficulty with L knee flexion to allow RLE to reach floor progressed to stepping up/down one 6" step using L rail x 5, ascending leading with LLE and descending leading with LLE with patient reporting improved comfort with sequencing. Patient instructed in tall kneeling on mat table using low bench for LUE support with +2A, max verbal/tactile cues for upright posture, hip extension to neutral, decreased forward lean, and decreased pelvic rotation. Patient left sitting in wheelchair with 1/2 lap tray to support RUE and needs in reach.   Therapy Documentation Precautions:  Precautions Precautions: Fall Precaution Comments: R lean, L ankle instability Required Braces or Orthoses: Other Brace/Splint (hand splint and PRAFO at night) Restrictions Weight Bearing Restrictions: No Pain: Pain Assessment Pain Assessment: No/denies pain Pain Score: 0-No pain   See Function Navigator for Current Functional Status.   Therapy/Group: Individual Therapy  Kerney ElbeVarner, Hilliard Borges A 12/18/2015, 10:04 AM

## 2015-12-18 NOTE — Progress Notes (Signed)
Occupational Therapy Note  Patient Details  Name: Sara [name] MRN: [id number] Date of Birth: [birth year]  Today's Date: 12/18/2015 OT Individual Time: 1300-1400 OT Individual Time Calculation (min): 60 min   Pt denied pain Individual Therapy  Pt resting in w/c upon arrival.  Pt initially engaged in sit<>stand at parallel bars with increased reliance on her LUE to pull up and remain standing.  Pt required max multimodal cues to shift weight onto RLE.  Pt performed sit<>stand X 4 but was unable to remove her LUE as support.  Increase clonus noted in pt's RLE whenever foot placed in dorsiflexion.  Pt transitioned to RUE NMR with emphasis on weight bearing while reaching for objects with her LUE.  No active movement/muscle activity noted.  Pt educated on RUE PROM. Pt return demonstrated.  Pt returned to room with half lap tray in place and all needs within reach.    [name], Thorvald Orsino [facility] Center[facility]appell 12/18/2015, 2:50 PM

## 2015-12-18 NOTE — Progress Notes (Signed)
Physical Therapy Session Note  Patient Details  Name: [name] Xxxroyster MRN: [id number] Date of Birth: Nov 08, 1980  Today's Date: 12/18/2015 PT Individual Time: 1431-1500 PT Individual Time Calculation (min): 29 min   Short Term Goals: Week 1:  PT Short Term Goal 1 (Week 1): Patient will perform bed <> wheelchair transfers with mod A x 2.  PT Short Term Goal 2 (Week 1): Patient will maintain dynamic standing balance x 1 min with max A.  PT Short Term Goal 3 (Week 1): Patient will perform bed mobility with max A and mod verbal cues for technique.  PT Short Term Goal 4 (Week 1): Patient will perform pre-gait activities with assist of 1 person.  PT Short Term Goal 5 (Week 1): Patient will maintain dynamic sitting balance x 5 min with supervision.   Skilled Therapeutic Interventions/Progress Updates:    [facility] and agreeable to PT. Transported to gym with total A for time management. PT instructed pt WC<>mat table Squat pivot transfer x6 with max A x 4 and Max A+2 x 2. Max verbal, tactile and visual cues for sequencing, UE placement, improved pushed through LLE, increased activation of trunk muscles for improved hip mobility . Patient demonstrated improved positioning and push through L UE and LE folling instruction from PT WC mobility for 18950ft with LUE propulsion and max A form PT for steering.   Patient left sitting in WC and call bell within reach.   Therapy Documentation Precautions:  Precautions Precautions: Fall Precaution Comments: R lean, L ankle instability Required Braces or Orthoses: Other Brace/Splint (hand splint and PRAFO at night) Restrictions Weight Bearing Restrictions: No General:   Vital Signs: Therapy Vitals Temp: 98.3 F (36.8 C) Temp Source: Oral Pulse Rate: 81 Resp: 18 BP: 123/69 mmHg Patient Position (if appropriate): Sitting Oxygen Therapy SpO2: 100 % O2 Device: Not Delivered   See Function Navigator for Current Functional  Status.   Therapy/Group: Individual Therapy  Golden Popustin E Tahmir Kleckner 12/18/2015, 6:29 PM

## 2015-12-19 ENCOUNTER — Inpatient Hospital Stay (HOSPITAL_COMMUNITY): Payer: Self-pay | Admitting: *Deleted

## 2015-12-19 ENCOUNTER — Inpatient Hospital Stay (HOSPITAL_COMMUNITY): Payer: Medicare Other | Admitting: Physical Therapy

## 2015-12-19 ENCOUNTER — Inpatient Hospital Stay (HOSPITAL_COMMUNITY): Payer: Medicare Other

## 2015-12-19 ENCOUNTER — Encounter (HOSPITAL_COMMUNITY): Payer: Self-pay

## 2015-12-19 ENCOUNTER — Inpatient Hospital Stay (HOSPITAL_COMMUNITY): Payer: Self-pay | Admitting: Occupational Therapy

## 2015-12-19 ENCOUNTER — Inpatient Hospital Stay (HOSPITAL_COMMUNITY): Payer: Medicare Other | Admitting: Speech Pathology

## 2015-12-19 DIAGNOSIS — F4321 Adjustment disorder with depressed mood: Secondary | ICD-10-CM

## 2015-12-19 MED ORDER — BISACODYL 10 MG RE SUPP
10.0000 mg | Freq: Once | RECTAL | Status: AC
  Administered 2015-12-19: 10 mg via RECTAL
  Filled 2015-12-19: qty 1

## 2015-12-19 MED ORDER — BACLOFEN 5 MG HALF TABLET
15.0000 mg | ORAL_TABLET | Freq: Three times a day (TID) | ORAL | Status: DC
  Administered 2015-12-19 – 2015-12-22 (×9): 15 mg via ORAL
  Filled 2015-12-19 (×9): qty 1

## 2015-12-19 NOTE — Progress Notes (Signed)
Social Work [name], female   DOB: 26-Jul-1981, 35 y.o.   MRN: [id number]  Met with pt and sister yesterday to review team conference.  Both aware and agreeable with targeted d/c date of 6/2 and min assist w/c level goals.  They do not have a confirmed d/c location yet but likely to d/c to sister or father's home.  Will need to continue to address this.  Pt pleased with gains so far but "frustrated" by loss of function on right side.  Neuropsychology consult taking place today.  Kynslie Ringle, LCSW

## 2015-12-19 NOTE — Progress Notes (Signed)
Physical Therapy Session Note  Patient Details  Name: Sara [name] MRN: [id number] Date of Birth: April 08, 1981  Today's Date: 12/19/2015 PT Individual Time: 1115-1200 PT Individual Time Calculation (min): 45 min   Short Term Goals: Week 1:  PT Short Term Goal 1 (Week 1): Patient will perform bed <> wheelchair transfers with mod A x 2.  PT Short Term Goal 2 (Week 1): Patient will maintain dynamic standing balance x 1 min with max A.  PT Short Term Goal 3 (Week 1): Patient will perform bed mobility with max A and mod verbal cues for technique.  PT Short Term Goal 4 (Week 1): Patient will perform pre-gait activities with assist of 1 person.  PT Short Term Goal 5 (Week 1): Patient will maintain dynamic sitting balance x 5 min with supervision.   Skilled Therapeutic Interventions/Progress Updates:   Session focused on w/c propulsion using LUE (partial use of LLE but due to height of w/c not helping patient much) with max assist to/from therapy session and neuro re-ed for transfers, postural control, trunk elongation/shortening, standing balance, weightshifting, weightbearing through BLE, and overall orientation to midline. Pt requires max to +2 assist for squat pivot transfers during session with focus on technique, head/hips relationship, and clearing bottom and multimodal cues. Sit to stands with max assist and facilitation for weightshifting and maintaining midline statically and while performing functional reaching tasks. Used mirror for visual feedback during standing and sitting balance neuro re-ed. End of session set-up in w/c with all needs in reach.   Therapy Documentation Precautions:  Precautions Precautions: Fall Precaution Comments: R lean, L ankle instability Required Braces or Orthoses: Other Brace/Splint (hand splint and PRAFO at night) Restrictions Weight Bearing Restrictions: No  Pain: Pain Assessment Pain Assessment: No/denies pain    See Function Navigator for  Current Functional Status.   Therapy/Group: Individual Therapy  Karolee StampsGray, Joaovictor Krone Darrol PokeBrescia  Leny Morozov B. Valine Drozdowski, PT, DPT  12/19/2015, 12:12 PM

## 2015-12-19 NOTE — Progress Notes (Signed)
Occupational Therapy Session Note  Patient Details  Name: Sara [name] MRN: [id number] Date of Birth: Jul 26, 1981  Today's Date: 12/19/2015 OT Individual Time: 1400-1430 OT Individual Time Calculation (min): 30 min    Short Term Goals: Week 1:  OT Short Term Goal 1 (Week 1): Pt will complete squat pivot transfers to max A of 1. OT Short Term Goal 2 (Week 1): Pt will don shirt with min A. OT Short Term Goal 3 (Week 1): Pt will don pants with mod A. OT Short Term Goal 4 (Week 1): Pt will perform RUE self ROM with mod cues. OT Short Term Goal 5 (Week 1): Pt will sit to stand at sink with max A of 1.  Skilled Therapeutic Interventions/Progress Updates:    Pt resting in w/c upon arrival.  Pt engaged in RUE NMR and PROM/SROM.  Pt Pt's RUE continues to exhibit moderate spasticity more notable in hand.  Pt's RLE noted with clonus with any dorsiflexion.  Pt demonstrated SROM from previous day. Pt also engaged in PNF with focus on trunk rotation.  Pt remained in w/c with all needs within reach.   Therapy Documentation Precautions:  Precautions Precautions: Fall Precaution Comments: R lean, L ankle instability Required Braces or Orthoses: Other Brace/Splint (hand splint and PRAFO at night) Restrictions Weight Bearing Restrictions: No  Pain:  Pt denied pain  ADL: ADL ADL Comments: refer to functional navigator  See Function Navigator for Current Functional Status.   Therapy/Group: Individual Therapy  Sara [name], Sara [name] 12/19/2015, 2:40 PM

## 2015-12-19 NOTE — Progress Notes (Signed)
Physical Therapy Session Note  Patient Details  Name: Sara [name] Xxxroyster MRN: [id number] Date of Birth: May 12, 1981  Today's Date: 12/19/2015 PT Individual Time: 0800-0900 and [id number] PT Individual Time Calculation (min): 60 min and 38 min  Short Term Goals: Week 1:  PT Short Term Goal 1 (Week 1): Patient will perform bed <> wheelchair transfers with mod A x 2.  PT Short Term Goal 2 (Week 1): Patient will maintain dynamic standing balance x 1 min with max A.  PT Short Term Goal 3 (Week 1): Patient will perform bed mobility with max A and mod verbal cues for technique.  PT Short Term Goal 4 (Week 1): Patient will perform pre-gait activities with assist of 1 person.  PT Short Term Goal 5 (Week 1): Patient will maintain dynamic sitting balance x 5 min with supervision.   Skilled Therapeutic Interventions/Progress Updates:   Treatment 1: Patient in bed upon arrival reporting breakfast tray just arrived. Donned TED hose and shoes total A and patient performed bridging with assist to stabilize BLE in hooklying, able to pull pants over hips with assist for threading BLE. Patient instructed in rolling to L with focus on sequencing and technique with mod A using rail and transferred sidelying > sit EOB with mod A using rail and mod verbal cues for sequencing. Patient sat edge of bed with supervision to eat breakfast.   Performed lateral scoot transfer with max A and multiple scoots due to decreased bottom clearance with transfer, max multimodal cues for forward weight shift and head hips relationship.   Gait training using L rail in hallway, 3 x 30 ft with seated rest between trials, max A to advance/place RLE and patient able to initiate hip flexion at initial swing phase, initial max multimodal cues for upright posture, decreased forward lean, advancing LUE along rail progressed to min-no cues with +2 for wheelchair follow for safety. Trialled GiveMohr sling on last trial for improved RUE positioning  and WB.   For RUE/RLE neuromuscular re-ed, weightbearing, and tone management, patient performed quadruped with +2A, RUE supported and manual facilitation to maintain proper alignment. Following quadruped position, patient with significant reduction in RUE tone. Patient left sitting in wheelchair with 1/2 lap tray donned and needs in reach.   Treatment 2: Session focused on progression of functional ambulation from use of LUE support on rail x 30 ft to use of L hemiwalker x 30 ft with initial demonstration and mod > min verbal/tactile cues for sequencing with max A to advance/place RLE with increased tone noted, +2 wheelchair follow for safety. Patient instructed in lateral scooting EOM to R and L with emphasis on head hips relationship and bottom clearance, min-max A with multimodal cues. Instructed in reciprocal scooting but patient with no activation noted on R side, in left weightbearing through forearm therapist facilitated reciprocal scooting anterior/posterior R hip with total A. Patient performed squat pivot transfers wheelchair <> mat table with max A to R and mod A to L and max multimodal cues for head hips relationship and bottom clearance with improved technique noted on last transfer. Patient propelled wheelchair to/from gym using LUE with max A for steering. Patient left sitting in wheelchair with sister in room.     Therapy Documentation Precautions:  Precautions Precautions: Fall Precaution Comments: R lean, L ankle instability Required Braces or Orthoses: Other Brace/Splint (hand splint and PRAFO at night) Restrictions Weight Bearing Restrictions: No Pain: Pain Assessment Pain Assessment: No/denies pain   See Function Navigator for Current  Functional Status.   Therapy/Group: Individual Therapy  Kerney Elbe 12/19/2015, 9:26 AM

## 2015-12-19 NOTE — Progress Notes (Signed)
Occupational Therapy Session Note  Patient Details  Name: Sara [name] MRN: [id number] Date of Birth: [birth year]  Today's Date: 12/19/2015 OT Individual Time: 1450-1520 OT Individual Time Calculation (min): 30 min   Short Term Goals: Week 1:  OT Short Term Goal 1 (Week 1): Pt will complete squat pivot transfers to max A of 1. OT Short Term Goal 2 (Week 1): Pt will don shirt with min A. OT Short Term Goal 3 (Week 1): Pt will don pants with mod A. OT Short Term Goal 4 (Week 1): Pt will perform RUE self ROM with mod cues. OT Short Term Goal 5 (Week 1): Pt will sit to stand at sink with max A of 1.  Skilled Therapeutic Interventions/Progress Updates:  Patient found seated in w/c with sister present in room. Engaged patient in a RUE HEP. Administered handout for exercises and educated pt on correct technique for each exercises. Exercises are AAROM. Encouraged pt to complete exercises twice a day 10X each. At end of session, left pt seated in w/c with all needs within reach.   Therapy Documentation Precautions:  Precautions Precautions: Fall Precaution Comments: R lean, L ankle instability Required Braces or Orthoses: Other Brace/Splint (hand splint and PRAFO at night) Restrictions Weight Bearing Restrictions: No  Vital Signs: Therapy Vitals Temp: 98.5 F (36.9 C) Temp Source: Oral Pulse Rate: 82 Resp: 18 BP: 122/64 mmHg Patient Position (if appropriate): Lying Oxygen Therapy SpO2: 100 % O2 Device: Not Delivered  ADL: ADL ADL Comments: refer to functional navigator  See Function Navigator for Current Functional Status.  Therapy/Group: Individual Therapy  Edwin CapPatricia [name] , MS, OTR/L, CLT Pager: [phone]  12/19/2015, 4:20 PM

## 2015-12-19 NOTE — Progress Notes (Signed)
Speech Language Pathology Daily Session Note  Patient Details  Name: Sara [name] MRN: [id number] Date of Birth: [birth year]  Today's Date: 12/19/2015 SLP Individual Time: 1000-1100 SLP Individual Time Calculation (min): 60 min  Short Term Goals: Week 1: SLP Short Term Goal 1 (Week 1): Pt demonstrate tolerance of Dys 3 consistencies to indicate readiness for PO upgrade at mod I. SLP Short Term Goal 2 (Week 1): Pt follow 2-step directives at mod I with 100% acc. SLP Short Term Goal 3 (Week 1): Pt demonstrate attention to R during functional tasks with min A. SLP Short Term Goal 4 (Week 1): Pt complete basic functional math tasks with min A. SLP Short Term Goal 5 (Week 1): Pt complete moderate level reasoning tasks with min A  Skilled Therapeutic Interventions: Skilled treatment session focused on cognitive goals. SLP facilitated session by providing Min-Mod A question and visual cues for problem solving during a basic money management task, supervision question cues to self-monitor and correct errors during a basic medication management task and was Mod I for basic reading comprehension tasks. Patient also required supervision verbal cues for attention to right field of environment with tasks. Patient left upright in wheelchair with all needs within reach. Continue with current plan of care.     Function:  Cognition Comprehension Comprehension assist level: Understands basic 90% of the time/cues < 10% of the time  Expression   Expression assist level: Expresses basic 90% of the time/requires cueing < 10% of the time.  Social Interaction Social Interaction assist level: Interacts appropriately with others with medication or extra time (anti-anxiety, antidepressant).  Problem Solving Problem solving assist level: Solves basic 75 - 89% of the time/requires cueing 10 - 24% of the time  Memory Memory assist level: Recognizes or recalls 75 - 89% of the time/requires cueing 10 - 24% of the  time    Pain No/Denies Pain   Therapy/Group: Individual Therapy  Nyzier Boivin 12/19/2015, 4:11 PM
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
## 2015-12-20 ENCOUNTER — Inpatient Hospital Stay (HOSPITAL_COMMUNITY): Payer: Medicare Other

## 2015-12-20 ENCOUNTER — Inpatient Hospital Stay (HOSPITAL_COMMUNITY): Payer: Medicare Other | Admitting: Physical Therapy

## 2015-12-20 ENCOUNTER — Inpatient Hospital Stay (HOSPITAL_COMMUNITY): Payer: Self-pay | Admitting: Physical Therapy

## 2015-12-20 ENCOUNTER — Inpatient Hospital Stay (HOSPITAL_COMMUNITY): Payer: Medicare Other | Admitting: Speech Pathology

## 2015-12-20 DIAGNOSIS — F4321 Adjustment disorder with depressed mood: Secondary | ICD-10-CM | POA: Insufficient documentation

## 2015-12-20 NOTE — Progress Notes (Signed)
Occupational Therapy Weekly Progress Note  Patient Details  Name: Sara [name] MRN: [id number] Date of Birth: 08/15/1980  Beginning of progress report period: Dec 13, 2015 End of progress report period: Dec 20, 2015  Patient has met 3 of 5 short term goals. Pt made steady progress with BADLs since admission.  Pt continues to require max A for UB/LB dressing tasks but has quickly incorporated hemi bathing and dressing strategies/techniques into her BADLs.  Pt requires max A for squat pivot transfers.  Pt performs sit<>stand with mod A using her LUE as support but is unable, at this time, to use LUE to assist with BADLs when standing. Pt exhibits behaviors consistent with [place] Level VII.  Patient continues to demonstrate the following deficits: R hemiparesis with hypertone, decreased trunk control in standing (maintains well in sitting and semi stand in stedy lift), decreased memory and slow processing skills, decreased R side awareness, limited standing tolerance and max A with standing balance and therefore will continue to benefit from skilled OT intervention to enhance overall performance with BADL.  Patient progressing toward long term goals..  Continue plan of care.  OT Short Term Goals Week 1:  OT Short Term Goal 1 (Week 1): Pt will complete squat pivot transfers to max A of 1. OT Short Term Goal 1 - Progress (Week 1): Met OT Short Term Goal 2 (Week 1): Pt will don shirt with min A. OT Short Term Goal 2 - Progress (Week 1): Progressing toward goal OT Short Term Goal 3 (Week 1): Pt will don pants with mod A. OT Short Term Goal 3 - Progress (Week 1): Progressing toward goal OT Short Term Goal 4 (Week 1): Pt will perform RUE self ROM with mod cues. OT Short Term Goal 4 - Progress (Week 1): Met OT Short Term Goal 5 (Week 1): Pt will sit to stand at sink with max A of 1. OT Short Term Goal 5 - Progress (Week 1): Met Week 2:  OT Short Term Goal 1 (Week 2): Pt will don shirt with min  A OT Short Term Goal 2 (Week 2): Pt will don pants with mod A OT Short Term Goal 3 (Week 2): Pt will perform BSC transfers with max A OT Short Term Goal 4 (Week 2): Pt will perform toileting tasks with max A      Therapy Documentation Precautions:  Precautions Precautions: Fall Precaution Comments: R lean, L ankle instability Required Braces or Orthoses: Other Brace/Splint (hand splint and PRAFO at night) Restrictions Weight Bearing Restrictions: No    ADL: ADL ADL Comments: refer to functional navigator  See Function Navigator for Current Functional Status.    Leotis Shames [facility] 12/20/2015, 2:54 PM
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
## 2015-12-20 NOTE — Progress Notes (Signed)
Physical Therapy Session Note  Patient Details  Name: Sara [name] MRN: [id number] Date of Birth: 10/26/1980  Today's Date: 12/20/2015 PT Individual Time: 5277-8242 PT Individual Time Calculation (min): 30 min   Short Term Goals: Week 1:  PT Short Term Goal 1 (Week 1): Patient will perform bed <> wheelchair transfers with mod A x 2.  PT Short Term Goal 1 - Progress (Week 1): Met PT Short Term Goal 2 (Week 1): Patient will maintain dynamic standing balance x 1 min with max A.  PT Short Term Goal 2 - Progress (Week 1): Met PT Short Term Goal 3 (Week 1): Patient will perform bed mobility with max A and mod verbal cues for technique.  PT Short Term Goal 3 - Progress (Week 1): Met PT Short Term Goal 4 (Week 1): Patient will perform pre-gait activities with assist of 1 person.  PT Short Term Goal 4 - Progress (Week 1): Met PT Short Term Goal 5 (Week 1): Patient will maintain dynamic sitting balance x 5 min with supervision.  PT Short Term Goal 5 - Progress (Week 1): Met  Skilled Therapeutic Interventions/Progress Updates:    Session focused on neuro re-ed for rolling, bridging and bed mobility and stretching to RLE and RUE in all planes for tone management to aid with overall mobility. Verbal and manual cues for rolling technique especially to the L (mod assist) as pt able to roll with supervision to the R using rail and then without rail. Cues to activate through trunk. End of session put PRAFO and R hand splint back on for positioning until next session.   Therapy Documentation Precautions:  Precautions Precautions: Fall Precaution Comments: R lean, L ankle instability Required Braces or Orthoses: Other Brace/Splint (hand splint and PRAFO at night) Restrictions Weight Bearing Restrictions: No   Pain: Denies pain.   See Function Navigator for Current Functional Status.   Therapy/Group: Individual Therapy  Canary Brim Ivory Broad, PT, DPT  12/20/2015, 8:41 AM

## 2015-12-20 NOTE — Progress Notes (Signed)
Occupational Therapy Session Note  Patient Details  Name: Sara [name] MRN: [id number] Date of Birth: 1981-05-13  Today's Date: 12/20/2015 OT Individual Time: 0900-1000 OT Individual Time Calculation (min): 60 min    Short Term Goals: Week 1:  OT Short Term Goal 1 (Week 1): Pt will complete squat pivot transfers to max A of 1. OT Short Term Goal 2 (Week 1): Pt will don shirt with min A. OT Short Term Goal 3 (Week 1): Pt will don pants with mod A. OT Short Term Goal 4 (Week 1): Pt will perform RUE self ROM with mod cues. OT Short Term Goal 5 (Week 1): Pt will sit to stand at sink with max A of 1.  Skilled Therapeutic Interventions/Progress Updates:    Pt resting in bed upon arrival.  Pt engaged in BADL retraining including bathing and dressing with sit<>stand from w/c at sink.  Pt required mod A for supine>sit EOB and max A for squat pivot transfer bed>w/c.  Pt educated on hemi bathing and dressing techniques.  Pt requires assistance with threading her RUE and RLE in clothing.  Pt requires mod A for sit<>stand at sink using LUE for support.  Pt unable to remove LUE from support to assist with bathing buttocks or pulling up pants.  Focus on activity tolerance, bed mobility, functional transfers, sitting balance, sit<>stand, standing balance, increased RUE, R attention, and safety awareness to increase independence with BADLs.   Therapy Documentation Precautions:  Precautions Precautions: Fall Precaution Comments: R lean, L ankle instability Required Braces or Orthoses: Other Brace/Splint (hand splint and PRAFO at night) Restrictions Weight Bearing Restrictions: No  Pain:  Pt denied pain ADL: ADL ADL Comments: refer to functional navigator  See Function Navigator for Current Functional Status.   Therapy/Group: Individual Therapy  [name], Jenny Omdahl Chappell 12/20/2015, 10:05 AM

## 2015-12-20 NOTE — Progress Notes (Signed)
Physical Therapy Weekly Progress Note  Patient Details  Name: Sara [name] MRN: [id number] Date of Birth: May 09, 1981  Beginning of progress report period: Dec 13, 2015 End of progress report period: Dec 20, 2015  Today's Date: 12/20/2015 PT Individual Time: 1310-1403 PT Individual Time Calculation (min): 53 min   Patient has met 5 of 5 short term goals. Patient has made excellent progress this reporting period, as demonstrated by performing functional transfers with assist of one person, functional ambulation using hemiwalker, initiation of stair training, and improved standing balance. Patient remains limited by premorbid L ankle injury/decreased ankle mobility, R hemiplegia, and increased tone. Patient is highly motivated and patient and family education is ongoing.   Patient continues to demonstrate the following deficits: R hemiplegia, weakness, decreased endurance, impaired timing and sequencing, abnormal tone, unbalanced muscle activation, decreased coordination, decreased midline orientation, decreased attention to R, decreased attention, decreased awareness, decreased problem solving, decreased safety awareness, decreased memory, delayed processing, decreased sitting balance, decreased standing balance, decreased postural control, and decreased balance strategies and therefore will continue to benefit from skilled PT intervention to enhance overall performance with activity tolerance, balance, postural control, ability to compensate for deficits, functional use of  right upper extremity and right lower extremity, awareness and coordination.  Patient progressing toward long term goals.  Continue plan of care.  PT Short Term Goals Week 1:  PT Short Term Goal 1 (Week 1): Patient will perform bed <> wheelchair transfers with mod A x 2.  PT Short Term Goal 1 - Progress (Week 1): Met PT Short Term Goal 2 (Week 1): Patient will maintain dynamic standing balance x 1 min with max A.  PT  Short Term Goal 2 - Progress (Week 1): Met PT Short Term Goal 3 (Week 1): Patient will perform bed mobility with max A and mod verbal cues for technique.  PT Short Term Goal 3 - Progress (Week 1): Met PT Short Term Goal 4 (Week 1): Patient will perform pre-gait activities with assist of 1 person.  PT Short Term Goal 4 - Progress (Week 1): Met PT Short Term Goal 5 (Week 1): Patient will maintain dynamic sitting balance x 5 min with supervision.  PT Short Term Goal 5 - Progress (Week 1): Met Week 2:  PT Short Term Goal 1 (Week 2): Patient will perform bed mobility with consistent min A with use of rails.  PT Short Term Goal 2 (Week 2): Patient will transfer bed <> wheelchair with mod A.  PT Short Term Goal 3 (Week 2): Patient will perform sit <> stand with mod A.  PT Short Term Goal 4 (Week 2): Patient will ambulate x 50 ft with max A using LRAD.  PT Short Term Goal 5 (Week 2): Patient will maintain dynamic standing balance x 3 min with mod A.   Skilled Therapeutic Interventions/Progress Updates:   Patient sitting in wheelchair upon arrival. Propelled wheelchair to/from gym using LUE with max A. Patient transferred from wheelchair <> tall kneeling using small bench on mat table with +2A for safety. In tall kneeling, therapist facilitated RUE weightbearing on bench during functional reaching task with LUE forward and slightly to R with max multimodal cues for hip extension to neutral and anterior weight shift instead of dropping hips backward to reach forward. Patient required max A for sit <> stand from wheelchair with heavy lateral lean to R. Gait training in straight path using hemiwalker x 35 ft with max-total A to advance/place RLE with +2 WC follow  for safety. Patient demonstrating increased ability to advance RLE but limited by increased extensor tone. Progressed to functional ambulation incorporating turns to perform TUG using hemiwalker = 4 minutes 38 seconds with max verbal cues for sequencing  use of HW. In gym, attempted squat pivot transfer to mat table multiple times with focus on bottom clearance but patient only scooting anterior in chair, therefore attempted stand pivot transfer using hemiwalker with posterior LOB requiring +2A to return safely due to chair. Anticipate patient experiencing increased difficulty with transfers at end of session due to fatigue. Patient left sitting in wheelchair with all needs in reach, 1/2 lap tray donned.    Therapy Documentation Precautions:  Precautions Precautions: Fall Precaution Comments: R lean, L ankle instability Required Braces or Orthoses: Other Brace/Splint (hand splint and PRAFO at night) Restrictions Weight Bearing Restrictions: No Pain: Pain Assessment Pain Assessment: No/denies pain  See Function Navigator for Current Functional Status.  Therapy/Group: Individual Therapy  Laretta Alstrom 12/20/2015, 2:10 PM
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
## 2015-12-20 NOTE — Progress Notes (Signed)
Speech Language Pathology Daily Session Note  Patient Details  Name: Sara [name] MRN: [id number] Date of Birth: 22-Mar-1981  Today's Date: 12/20/2015 SLP Individual Time: 1130-1200 SLP Individual Time Calculation (min): 30 min  Short Term Goals: Week 2: SLP Short Term Goal 1 (Week 2): Patient will demonstrate complex problem solving for functional and familiar tasks with supervision verbal cues.  SLP Short Term Goal 2 (Week 2): Patient will recall new, daily information with supervision verbal cues.  SLP Short Term Goal 3 (Week 2): Patient will self-monitor and correct erros during functional tasks with supervision verbal cues.  SLP Short Term Goal 4 (Week 2): Patient will attend to right field of enviornment with functional tasks with supervision verbal cues.   Skilled Therapeutic Interventions: Pt seen for cognitive-linguistic therapy with focus on information processing and awareness to the L. After initial cueing to attend to the L, pt able to include entire visual field at mod I. Single digit subtraction completed with 70% when presented vertically. Unable to solve math problems with horizontal presentation secondary to decreased working memory.    Function:  Eating Eating     Eating Assist Level: Set up assist for   Eating Set Up Assist For: Opening containers;Cutting food       Cognition Comprehension Comprehension assist level: Understands complex 90% of the time/cues 10% of the time  Expression   Expression assist level: Expresses complex 90% of the time/cues < 10% of the time  Social Interaction Social Interaction assist level: Interacts appropriately 90% of the time - Needs monitoring or encouragement for participation or interaction.  Problem Solving Problem solving assist level: Solves basic problems with no assist  Memory Memory assist level: Recognizes or recalls 90% of the time/requires cueing < 10% of the time    Pain Pain Assessment Pain Assessment:  No/denies pain  Therapy/Group: Individual Therapy  Joaquin CourtsKara E TurnerMA, CCC-SLP 12/20/2015, 1:03 PM

## 2015-12-20 NOTE — Progress Notes (Signed)
Speech Language Pathology Weekly Progress and Session Note  Patient Details  Name: Sara [name] MRN: [id number] Date of Birth: 1981-02-05  Beginning of progress report period: Dec 13, 2015 End of progress report period: Dec 20, 2015  Today's Date: 12/20/2015 SLP Individual Time: 1000-1100 SLP Individual Time Calculation (min): 60 min  Short Term Goals: Week 1: SLP Short Term Goal 1 (Week 1): Pt demonstrate tolerance of Dys 3 consistencies to indicate readiness for PO upgrade at mod I. SLP Short Term Goal 1 - Progress (Week 1): Met SLP Short Term Goal 2 (Week 1): Pt follow 2-step directives at mod I with 100% acc. SLP Short Term Goal 2 - Progress (Week 1): Met SLP Short Term Goal 3 (Week 1): Pt demonstrate attention to R during functional tasks with min A. SLP Short Term Goal 3 - Progress (Week 1): Met SLP Short Term Goal 4 (Week 1): Pt complete basic functional math tasks with min A. SLP Short Term Goal 4 - Progress (Week 1): Met SLP Short Term Goal 5 (Week 1): Pt complete moderate level reasoning tasks with min A SLP Short Term Goal 5 - Progress (Week 1): Met    New Short Term Goals: Week 2: SLP Short Term Goal 1 (Week 2): Patient will demonstrate complex problem solving for functional and familiar tasks with supervision verbal cues.  SLP Short Term Goal 2 (Week 2): Patient will recall new, daily information with supervision verbal cues.  SLP Short Term Goal 3 (Week 2): Patient will self-monitor and correct erros during functional tasks with supervision verbal cues.  SLP Short Term Goal 4 (Week 2): Patient will attend to right field of enviornment with functional tasks with supervision verbal cues.   Weekly Progress Updates: Patient has made functional gains and has met 5 of 5 STG's this reporting period. Currently, patient is consuming regular textures with thin liquids and is Mod I for use of swallowing strategies, therefore, all dysphagia goals are met. Patient also completes  functional and mildly complex safely in regards to supervision-Min A verbal and question cues to complete functional and familiar tasks safely in regards to recall, problem solving, awareness and attention to right field of environment. Patient education is ongoing. Patient would benefit from continued skilled SLP intervention to maximize her cognitive function and overall functional independence prior to discharge home.     Intensity: Minumum of 1-2 x/day, 30 to 90 minutes Frequency: 3 to 5 out of 7 days Duration/Length of Stay: 6/2 Treatment/Interventions: Cognitive remediation/compensation;Speech/Language facilitation;Functional tasks;Therapeutic Activities;Cueing hierarchy;Therapeutic Exercise;Medication managment;Patient/family education;Dysphagia/aspiration precaution training   Daily Session  Skilled Therapeutic Interventions: Skilled treatment session focused on cognitive goals. SLP facilitated session by providing extra time for utilization of memory compensatory strategies and recall during a novel recall task and Min A verbal cues for comprehension to procedures of task. Patient also completed generative naming task with Mod I. Patient attended to all tasks in a moderately distracting environment for ~20 minutes with Mod I and attended to the right field of her environment with supervision verbal cues. Patient left upright in wheelchair with all needs within reach. Continue with current plan of care.        Function:   Cognition Comprehension Comprehension assist level: Understands complex 90% of the time/cues 10% of the time  Expression   Expression assist level: Expresses complex 90% of the time/cues < 10% of the time  Social Interaction Social Interaction assist level: Interacts appropriately 90% of the time - Needs monitoring or encouragement for  participation or interaction.  Problem Solving Problem solving assist level: Solves complex 90% of the time/cues < 10% of the time   Memory Memory assist level: Recognizes or recalls 90% of the time/requires cueing < 10% of the time   Pain No/Denies Pain   Therapy/Group: Individual Therapy  Mackey Varricchio 12/20/2015, 12:11 PM

## 2015-12-20 NOTE — Progress Notes (Signed)
Physical Therapy Session Note  Patient Details  Name: Sara [name] MRN: [id number] Date of Birth: 1981/04/12  Today's Date: 12/20/2015 PT Missed Time: 43 Minutes Missed Time Reason: Patient fatigue  Pt sleeping in bed on arrival.  Reports feeling very tired from previous therapy session and declining therapy at this time.  Will continue to follow.  Call bell in reach and needs met.   Baillie Mohammad E Penven-Crew 12/20/2015, 3:08 PM

## 2015-12-21 ENCOUNTER — Inpatient Hospital Stay (HOSPITAL_COMMUNITY): Payer: Medicare Other | Admitting: *Deleted

## 2015-12-21 NOTE — Progress Notes (Signed)
Physical Therapy Session Note  Patient Details  Name: Sara [name] MRN: [id number] Date of Birth: 02/02/1981  Today's Date: 12/21/2015 PT Individual Time: 1258-1328 PT Individual Time Calculation (min): 30 min    Skilled Therapeutic Interventions/Progress Updates:  Patient in bed at the beginning of session, complains of pain in R calf, no redness or swelling noted but TTP with positive Homan sign. Reported to RN. Patient transferred vis sit to stand frame with max A , sit to stand in frame x 2.  [name]/c mobility on level surface and on carpet with mod to max A for  Path following and navigating through doors and narrow spaces.  Transfer via stand pivot to mat -max A.  Seated balance training with reaching for objects OBOS,with crossing mid line.  Returned to [name]/c and tech returned patient to room.   Therapy Documentation Precautions:  Precautions Precautions: Fall Precaution Comments: R lean, L ankle instability Required Braces or Orthoses: Other Brace/Splint (hand splint and PRAFO at night) Restrictions Weight Bearing Restrictions: No Vital Signs: Therapy Vitals Temp: 98.6 F (37 C) Temp Source: Oral Pulse Rate: 90 Resp: 18 BP: 118/71 mmHg Patient Position (if appropriate): Sitting Oxygen Therapy SpO2: 100 % O2 Device: Not Delivered Pain: Pain Assessment Pain Assessment: 0-10 Pain Score: 6  Pain Type: Acute pain Pain Location: Leg Pain Orientation: Right Pain Descriptors / Indicators: Aching Pain Onset: Gradual Pain Intervention(s): Medication (See eMAR)   See Function Navigator for Current Functional Status.   Therapy/Group: Individual Therapy  Dorna MaiCzajkowska, Sara [name] 12/21/2015, 3:35 PM
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
## 2015-12-22 ENCOUNTER — Inpatient Hospital Stay (HOSPITAL_COMMUNITY): Payer: Medicare Other | Admitting: Physical Therapy

## 2015-12-22 MED ORDER — BACLOFEN 10 MG PO TABS
20.0000 mg | ORAL_TABLET | Freq: Three times a day (TID) | ORAL | Status: DC
  Administered 2015-12-22 – 2016-01-10 (×57): 20 mg via ORAL
  Filled 2015-12-22 (×58): qty 2
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
## 2015-12-22 NOTE — Progress Notes (Signed)
Physical Therapy Session Note  Patient Details  Name: Sara [name] MRN: [id number] Date of Birth: 10-07-1980  Today's Date: 12/22/2015 PT Individual Time: 1120-1200 PT Individual Time Calculation (min): 40 min   Short Term Goals: Week 2:  PT Short Term Goal 1 (Week 2): Patient will perform bed mobility with consistent min A with use of rails.  PT Short Term Goal 2 (Week 2): Patient will transfer bed <> wheelchair with mod A.  PT Short Term Goal 3 (Week 2): Patient will perform sit <> stand with mod A.  PT Short Term Goal 4 (Week 2): Patient will ambulate x 50 ft with max A using LRAD.  PT Short Term Goal 5 (Week 2): Patient will maintain dynamic standing balance x 3 min with mod A.   Skilled Therapeutic Interventions/Progress Updates:   Patient in wheelchair upon arrival, propelled to/from gym using LUE with max A. Performed squat pivot transfer to right max A with improved bottom clearance and technique. Performed functional reaching tasks in standing with intermittent LUE support on HW and therapist providing max A with focus on reaching forward and slightly outside BOS to L to facilitate lateral weight shift L for midline positioning and upright posture, mirror for visual feedback. During rest breaks, performed seated reaching tasks with supervision. Gait training using L hemiwalker from seated EOM out gym door including 2 turns x 40 ft + 30 ft with max A, cues for step-through pattern due to decreased L step length, max-total A to advance/place RLE due to increased tone, +2 for wheelchair follow. Patient left in wheelchair with needs in reach.   Therapy Documentation Precautions:  Precautions Precautions: Fall Precaution Comments: R lean, L ankle instability Required Braces or Orthoses: Other Brace/Splint (hand splint and PRAFO at night) Restrictions Weight Bearing Restrictions: No Pain: Pain Assessment Pain Assessment: No/denies pain   See Function Navigator for Current  Functional Status.   Therapy/Group: Individual Therapy  Kerney ElbeVarner, Zethan Alfieri A 12/22/2015, 12:34 PM

## 2015-12-23 ENCOUNTER — Inpatient Hospital Stay (HOSPITAL_COMMUNITY): Payer: Medicare Other | Admitting: Speech Pathology

## 2015-12-23 ENCOUNTER — Inpatient Hospital Stay (HOSPITAL_COMMUNITY): Payer: Medicare Other | Admitting: Physical Therapy

## 2015-12-23 ENCOUNTER — Inpatient Hospital Stay (HOSPITAL_COMMUNITY): Payer: Self-pay | Admitting: Occupational Therapy

## 2015-12-23 ENCOUNTER — Inpatient Hospital Stay (HOSPITAL_COMMUNITY): Payer: Medicare Other

## 2015-12-23 LAB — BASIC METABOLIC PANEL
ANION GAP: 8 (ref 5–15)
BUN: 6 mg/dL (ref 6–20)
CHLORIDE: 105 mmol/L (ref 101–111)
CO2: 27 mmol/L (ref 22–32)
Calcium: 9.3 mg/dL (ref 8.9–10.3)
Creatinine, Ser: 0.78 mg/dL (ref 0.44–1.00)
Glucose, Bld: 144 mg/dL — ABNORMAL HIGH (ref 65–99)
POTASSIUM: 4.1 mmol/L (ref 3.5–5.1)
SODIUM: 140 mmol/L (ref 135–145)

## 2015-12-23 LAB — CBC
HCT: 29 % — ABNORMAL LOW (ref 36.0–46.0)
HEMOGLOBIN: 8.9 g/dL — AB (ref 12.0–15.0)
MCH: 27.1 pg (ref 26.0–34.0)
MCHC: 30.7 g/dL (ref 30.0–36.0)
MCV: 88.4 fL (ref 78.0–100.0)
PLATELETS: 653 10*3/uL — AB (ref 150–400)
RBC: 3.28 MIL/uL — AB (ref 3.87–5.11)
RDW: 13.6 % (ref 11.5–15.5)
WBC: 7 10*3/uL (ref 4.0–10.5)

## 2015-12-23 MED ORDER — GABAPENTIN 300 MG PO CAPS
900.0000 mg | ORAL_CAPSULE | Freq: Every day | ORAL | Status: DC
  Administered 2015-12-24 – 2016-01-09 (×17): 900 mg via ORAL
  Filled 2015-12-23 (×17): qty 3
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
## 2015-12-23 NOTE — Progress Notes (Signed)
Speech Language Pathology Daily Session Note  Patient Details  Name: [name] Xxxroyster MRN: [id number] Date of Birth: 1981/07/18  Today's Date: 12/23/2015 SLP Individual Time: 0800-0900 SLP Individual Time Calculation (min): 60 min  Short Term Goals: Week 2: SLP Short Term Goal 1 (Week 2): Patient will demonstrate complex problem solving for functional and familiar tasks with supervision verbal cues.  SLP Short Term Goal 2 (Week 2): Patient will recall new, daily information with supervision verbal cues.  SLP Short Term Goal 3 (Week 2): Patient will self-monitor and correct erros during functional tasks with supervision verbal cues.  SLP Short Term Goal 4 (Week 2): Patient will attend to right field of enviornment with functional tasks with supervision verbal cues.   Skilled Therapeutic Interventions:  Pt was seen for skilled ST targeting cognitive goals.  Pt was awake, alert, and pleasantly interactive upon arrival.  Pt recalled details of previous ST therapy sessions and goals of therapies with supervision question cues.   Pt directed her care with supervision question cues to facilitate safe transfer to wheelchair with assistance from nurse tech andStedy.  SLP facilitated the session with a semi-complex scheduling task targeting functional problem solving.  Pt required min assist verbal cues for working memory to complete the abovementioned task for 100% accuracy.  Pt was returned to room and left in wheelchair with call bell within reach.  Continue per current plan of care.     Function:  Eating Eating                 Cognition Comprehension Comprehension assist level: Understands complex 90% of the time/cues 10% of the time  Expression   Expression assist level: Expresses complex 90% of the time/cues < 10% of the time  Social Interaction Social Interaction assist level: Interacts appropriately 90% of the time - Needs monitoring or encouragement for participation or interaction.   Problem Solving Problem solving assist level: Solves basic 90% of the time/requires cueing < 10% of the time  Memory Memory assist level: Recognizes or recalls 75 - 89% of the time/requires cueing 10 - 24% of the time    Pain Pain Assessment Pain Assessment: No/denies pain  Therapy/Group: Individual Therapy  Abryana Lykens, Melanee SpryNicole L 12/23/2015, 12:56 PM

## 2015-12-23 NOTE — Progress Notes (Signed)
Occupational Therapy Session Note  Patient Details  Name: Sara [name] MRN: [id number] Date of Birth: 1981-04-04  Today's Date: 12/23/2015 OT Individual Time: 0900-1000 OT Individual Time Calculation (min): 60 min    Short Term Goals: Week 2:  OT Short Term Goal 1 (Week 2): Pt will don shirt with min A OT Short Term Goal 2 (Week 2): Pt will don pants with mod A OT Short Term Goal 3 (Week 2): Pt will perform BSC transfers with max A OT Short Term Goal 4 (Week 2): Pt will perform toileting tasks with max A  Skilled Therapeutic Interventions/Progress Updates:    Pt resting in w/c upon arrival.  Pt stated that her pants were clean and the NT had just finished cleaning her LB.  Pt engaged in UB bathing and dressing tasks seated in w/c at sink.  Pt initiated all bathing and dressing tasks with min multimodal cues and demonstration of hemi bathing and dressing techniques previously demonstrated.  Pt transitioned to therapy gym and performed squat pivot transfer to mat with max A and max verbal cues for sequencing.  Pt initially attempted to transfer before pushing through BLE adequately to clear her buttocks and required +2 assistance for safety in repositioning in w/c.  Pt was successful in second attempt with transfer.  Pt engaged in RUE NMR including weight bearing and facilitated reaching and balancing stool.  No active muscle activity detected.  Continued RUE spacticity noted (hand>arm). Pt performed squat pivot transfer to w/c with max A.  Pt returned to room and remained in w/c with half lap tray in place and all needs within reach.  Focus on activity tolerance, sitting balance, functional transfers, hemi bathing/dressing techniques, RUE NMR, and safety awareness to increase independence with BADLs.   Therapy Documentation Precautions:  Precautions Precautions: Fall Precaution Comments: R lean, L ankle instability Required Braces or Orthoses: Other Brace/Splint (hand splint and PRAFO at  night) Restrictions Weight Bearing Restrictions: No   Pain:  Pt denied pain ADL: ADL ADL Comments: refer to functional navigator  See Function Navigator for Current Functional Status.   Therapy/Group: Individual Therapy  [name], Sara [name] 12/23/2015, 11:15 AM

## 2015-12-23 NOTE — Progress Notes (Signed)
Speech Language Pathology Daily Session Note  Patient Details  Name: Sara [name] MRN: [id number] Date of Birth: [birth year]  Today's Date: 12/23/2015 SLP Individual Time: 1400-1500 SLP Individual Time Calculation (min): 60 min  Short Term Goals: Week 2: SLP Short Term Goal 1 (Week 2): Patient will demonstrate complex problem solving for functional and familiar tasks with supervision verbal cues.  SLP Short Term Goal 2 (Week 2): Patient will recall new, daily information with supervision verbal cues.  SLP Short Term Goal 3 (Week 2): Patient will self-monitor and correct erros during functional tasks with supervision verbal cues.  SLP Short Term Goal 4 (Week 2): Patient will attend to right field of enviornment with functional tasks with supervision verbal cues.   Skilled Therapeutic Interventions: Skilled treatment session focused on addressing cognition goals. SLP facilitated session by providing a quiet environment and extra time to locate, organize, and calculate mildly complex information.  Patient with improved ability to self-monitor and correct errors this session!  Patient attended to the right of the environment with Supervision level verbal cues.  Continue with current plan of care.   Function:  Cognition Comprehension Comprehension assist level: Understands complex 90% of the time/cues 10% of the time  Expression   Expression assist level: Expresses complex 90% of the time/cues < 10% of the time  Social Interaction Social Interaction assist level: Interacts appropriately 90% of the time - Needs monitoring or encouragement for participation or interaction.  Problem Solving Problem solving assist level: Solves basic 90% of the time/requires cueing < 10% of the time  Memory Memory assist level: Recognizes or recalls 90% of the time/requires cueing < 10% of the time    Pain Pain Assessment Pain Assessment: No/denies pain  Therapy/Group: Individual Therapy  Sara [name],  M.A., CCC-SLP [phone]  Sara [name] 12/23/2015, 4:18 PM

## 2015-12-23 NOTE — Progress Notes (Signed)
Physical Therapy Session Note  Patient Details  Name: Sara [name] MRN: [id number] Date of Birth: [birth year]  Today's Date: 12/23/2015 PT Individual Time: 1105-1200 PT Individual Time Calculation (min): 55 min   Short Term Goals: Week 2:  PT Short Term Goal 1 (Week 2): Patient will perform bed mobility with consistent min A with use of rails.  PT Short Term Goal 2 (Week 2): Patient will transfer bed <> wheelchair with mod A.  PT Short Term Goal 3 (Week 2): Patient will perform sit <> stand with mod A.  PT Short Term Goal 4 (Week 2): Patient will ambulate x 50 ft with max A using LRAD.  PT Short Term Goal 5 (Week 2): Patient will maintain dynamic standing balance x 3 min with mod A.   Skilled Therapeutic Interventions/Progress Updates:    Pt received seated in w/c with RN present administering medications; denies pain and agreeable to treatment. W/c propulsion with LUE only for strengthening and endurance, mod/maxA for navigation. Gait training with hemiwalker 2x15' each with maxA and w/c follow. Cues for L hip anterior/lateral translation to improve weight shift for RLE advancement. Requires totalA for RLE progression due to tone, poor weight shift. Second trial performed with ace wrap dorsiflexion assist to improve toe clearance with mild improvement. Trial with PLS AFO, however sustained clonus and significant increase in knee flexion due to limited ankle ROM. NMR sit <>stand balance with L lateral reaching with LUE to retrieve horseshoes to facilitate L weight shift and midline control; required modA initially, decreased to standbyA. Returned to w/c with stand pivot maxA. Remained seated in w/c at completion of session, all needs within reach.   Therapy Documentation Precautions:  Precautions Precautions: Fall Precaution Comments: R lean, L ankle instability Required Braces or Orthoses: Other Brace/Splint (hand splint and PRAFO at night) Restrictions Weight Bearing Restrictions:  No Pain: Pain Assessment Pain Assessment: No/denies pain   See Function Navigator for Current Functional Status.   Therapy/Group: Individual Therapy  Vista Lawmanlizabeth J Tygielski 12/23/2015, 3:41 PM

## 2015-12-24 ENCOUNTER — Inpatient Hospital Stay (HOSPITAL_COMMUNITY): Payer: Medicare Other

## 2015-12-24 ENCOUNTER — Inpatient Hospital Stay (HOSPITAL_COMMUNITY): Payer: Self-pay | Admitting: Speech Pathology

## 2015-12-24 ENCOUNTER — Inpatient Hospital Stay (HOSPITAL_COMMUNITY): Payer: Medicare Other | Admitting: Occupational Therapy

## 2015-12-24 ENCOUNTER — Inpatient Hospital Stay (HOSPITAL_COMMUNITY): Payer: Medicare Other | Admitting: Physical Therapy

## 2015-12-24 NOTE — Progress Notes (Signed)
Occupational Therapy Session Note  Patient Details  Name: Sara [name] MRN: [id number] Date of Birth: June 01, 1981  Today's Date: 12/24/2015 OT Individual Time: 8280-0349 OT Individual Time Calculation (min): 43 min    Short Term Goals: Week 1:  OT Short Term Goal 1 (Week 1): Pt will complete squat pivot transfers to max A of 1. OT Short Term Goal 1 - Progress (Week 1): Met OT Short Term Goal 2 (Week 1): Pt will don shirt with min A. OT Short Term Goal 2 - Progress (Week 1): Progressing toward goal OT Short Term Goal 3 (Week 1): Pt will don pants with mod A. OT Short Term Goal 3 - Progress (Week 1): Progressing toward goal OT Short Term Goal 4 (Week 1): Pt will perform RUE self ROM with mod cues. OT Short Term Goal 4 - Progress (Week 1): Met OT Short Term Goal 5 (Week 1): Pt will sit to stand at sink with max A of 1. OT Short Term Goal 5 - Progress (Week 1): Met Week 2:  OT Short Term Goal 1 (Week 2): Pt will don shirt with min A OT Short Term Goal 2 (Week 2): Pt will don pants with mod A OT Short Term Goal 3 (Week 2): Pt will perform BSC transfers with max A OT Short Term Goal 4 (Week 2): Pt will perform toileting tasks with max A      Skilled Therapeutic Interventions/Progress Updates:    Pt seen this am for skilled OT to facilitate RUE/RLE with NMR and estim. Pt requested to brush her teeth, positioned at sink and shown how to open toothpaste and apply to brush one handed. Pt repeat demonstrated without difficulty.  Pt transported to gym, transferred to mat squat pivot with mod A of 1. RUE facilitation to reduce tone with gentle stretching using UE ranger.  Estim for 15 min attended (see parameters below).  Pt had good response during estim, but no active movement after. Pt does not have active movement in scapula but tried to facilitate with scapular retraction. RUE wt bearing and hand over hand facilitation with grasping and releasing cones.   Sit to stand with static stand with  no UE support. Therapist facilitated R knee extension and trunk lift. Pt cued to squeeze her glute and appeared to have some active movement.  Stood 2x for 2 min each with slight wt shifts to L. Transferred back to w/c, taken back to room with all needs met.  Therapy Documentation Precautions:  Precautions Precautions: Fall Precaution Comments: R lean, L ankle instability Required Braces or Orthoses: Other Brace/Splint (hand splint and PRAFO at night) Restrictions Weight Bearing Restrictions: No  Pain: no c/o pain   ADL: ADL ADL Comments: refer to functional navigator    Other Treatments: Modalities Modalities: Systems analyst Stimulation Location: dorsal forearm Electrical Stimulation Action: wrist and finger extension Electrical Stimulation Parameters: 10 sec on and 5 off, 35 pps, intesity 30 Electrical Stimulation Goals: Tone;Neuromuscular facilitation  See Function Navigator for Current Functional Status.   Therapy/Group: Individual Therapy  [place] 12/24/2015, 10:10 AM

## 2015-12-24 NOTE — Progress Notes (Signed)
Occupational Therapy Note  Patient Details  Name: Sara [name] MRN: [id number] Date of Birth: October 19, 1980  Today's Date: 12/24/2015 OT Individual Time: 1100-1200 OT Individual Time Calculation (min): 60 min   Pt denied pain Individual therapy  Pt resting in w/c upon arrival. Pt performed squat/scoot transfer to mat and engaged in RUE NMR including weight bearing and facilitated movement.  Pt unable to elicit RUE movement, shoulder shrugs, or scapula movement.  Pt transitioned to sit<>stand and standing balance activities. Pt requires min A for sit<>from mat and min A for standing balance with support provided at R knee.  Focus on improved/increased RUE movement and standing balance to increase independence with BADLs.    [name], Telia Amundson [facility]ptl[facility]appell 12/24/2015, 12:44 PM

## 2015-12-24 NOTE — Progress Notes (Signed)
Occupational Therapy Session Note  Patient Details  Name: Sara [name] MRN: [id number] Date of Birth: 02-Feb-1981  Today's Date: 12/24/2015 OT Individual Time: 0800-0900 OT Individual Time Calculation (min): 60 min    Short Term Goals: Week 2:  OT Short Term Goal 1 (Week 2): Pt will don shirt with min A OT Short Term Goal 2 (Week 2): Pt will don pants with mod A OT Short Term Goal 3 (Week 2): Pt will perform BSC transfers with max A OT Short Term Goal 4 (Week 2): Pt will perform toileting tasks with max A  Skilled Therapeutic Interventions/Progress Updates:    Pt engaged in BADL retraining including bathing at shower level and dressing with sit<>stand from w/c at sink.  Pt transferred to/from shower seat with use of Stedy.  Pt incorporated use of hemi techniques for bathing and dressing tasks.  Pt stood at sink with min A and required mod A for standing balance to facilitate pulling up pants.  Pt's RUE continues to exhibit increased tone when engaged in functional tasks.  Pt completed grooming tasks while seated in w/c at sink.  Focus on bed mobility, sitting balance, sit<>stand, standing balance, hemi bathing/dressing techniques, and safety awareness to increase independence with BADLs.   Therapy Documentation Precautions:  Precautions Precautions: Fall Precaution Comments: R lean, L ankle instability Required Braces or Orthoses: Other Brace/Splint (hand splint and PRAFO at night) Restrictions Weight Bearing Restrictions: No  Pain:  Pt denied pain  ADL: ADL ADL Comments: refer to functional navigator  See Function Navigator for Current Functional Status.   Therapy/Group: Individual Therapy  Sara [name], Sara [name] 12/24/2015, 9:07 AM

## 2015-12-24 NOTE — Progress Notes (Signed)
Physical Therapy Session Note  Patient Details  Name: Sara [name] Xxxroyster MRN: [id number] Date of Birth: 1980-09-12  Today's Date: 12/24/2015 PT Individual Time: 1305-1400 PT Individual Time Calculation (min): 55 min   Short Term Goals: Week 2:  PT Short Term Goal 1 (Week 2): Patient will perform bed mobility with consistent min A with use of rails.  PT Short Term Goal 2 (Week 2): Patient will transfer bed <> wheelchair with mod A.  PT Short Term Goal 3 (Week 2): Patient will perform sit <> stand with mod A.  PT Short Term Goal 4 (Week 2): Patient will ambulate x 50 ft with max A using LRAD.  PT Short Term Goal 5 (Week 2): Patient will maintain dynamic standing balance x 3 min with mod A.   Skilled Therapeutic Interventions/Progress Updates:   Patient sitting in wheelchair upon arrival, propelled to gym using LUE with max A. Performed squat pivot transfer wheelchair <> NuStep with max A. NuStep using BLE and LUE x 2 min progressed to BLE only x 8 min at level 3 for RLE NMR, forced use RLE, and tone reduction. Gait training using L hemiwalker x 55 ft and max A in straight path with verbal cues for increased L step length to facilitate step-through pattern, max-total A to advance/place RLE due to increased tone, +2 for wheelchair follow. Forced use neuro re-ed RLE performing step taps to 6" step to fatigue using L rail with max A progressed to step taps to fatigue using L hemiwalker for LUE support with max faded to mod A faded to min A with verbal/questioning cues for weight shift to L to maintain midline due to lateral lean R. Patient initially provided min tactile cues for R knee extension in stance progressed to no cues. Patient left sitting in wheelchair with 1/2 lap tray and needs in reach.   Therapy Documentation Precautions:  Precautions Precautions: Fall Precaution Comments: R lean, L ankle instability Required Braces or Orthoses: Other Brace/Splint (hand splint and PRAFO at  night) Restrictions Weight Bearing Restrictions: No Pain: Pain Assessment Pain Assessment: No/denies pain   See Function Navigator for Current Functional Status.   Therapy/Group: Individual Therapy  Kerney ElbeVarner, Rastus Borton A 12/24/2015, 2:03 PM
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
## 2015-12-24 NOTE — Progress Notes (Signed)
Speech Language Pathology Daily Session Note  Patient Details  Name: Sara [name] MRN: [id number] Date of Birth: January 27, 1981  Today's Date: 12/24/2015 SLP Individual Time: 1540-1610 SLP Individual Time Calculation (min): 30 min  Short Term Goals: Week 2: SLP Short Term Goal 1 (Week 2): Patient will demonstrate complex problem solving for functional and familiar tasks with supervision verbal cues.  SLP Short Term Goal 2 (Week 2): Patient will recall new, daily information with supervision verbal cues.  SLP Short Term Goal 3 (Week 2): Patient will self-monitor and correct erros during functional tasks with supervision verbal cues.  SLP Short Term Goal 4 (Week 2): Patient will attend to right field of enviornment with functional tasks with supervision verbal cues.   Skilled Therapeutic Interventions: Skilled treatment session focused on addressing cognition goals. SLP facilitated session by providing Min verbal cues to recall previously taught semi-complex problem solving task.  Patient was able to independently recall most details.  SLP discussed use of memory compensatory strategies for transition home as well as continued home management.  Patient and her mother verbalized understanding.  Continue with current plan of care.    Function:  Cognition Comprehension Comprehension assist level: Understands complex 90% of the time/cues 10% of the time  Expression   Expression assist level: Expresses complex 90% of the time/cues < 10% of the time  Social Interaction Social Interaction assist level: Interacts appropriately 90% of the time - Needs monitoring or encouragement for participation or interaction.  Problem Solving Problem solving assist level: Solves basic 90% of the time/requires cueing < 10% of the time  Memory Memory assist level: Recognizes or recalls 90% of the time/requires cueing < 10% of the time    Pain Pain Assessment Pain Assessment: No/denies pain  Therapy/Group:  Individual Therapy  Charlane FerrettiMelissa Indra Wolters, M.A., CCC-SLP [phone]  August Longest 12/24/2015, 4:56 PM

## 2015-12-25 ENCOUNTER — Inpatient Hospital Stay (HOSPITAL_COMMUNITY): Payer: Medicare Other | Admitting: Physical Therapy

## 2015-12-25 ENCOUNTER — Inpatient Hospital Stay (HOSPITAL_COMMUNITY): Payer: Medicare Other

## 2015-12-25 ENCOUNTER — Inpatient Hospital Stay (HOSPITAL_COMMUNITY): Payer: Medicare Other | Admitting: Speech Pathology

## 2015-12-25 NOTE — Progress Notes (Signed)
Occupational Therapy Session Note  Patient Details  Name: Sara [name] MRN: [id number] Date of Birth: Feb 28, 1981  Today's Date: 12/25/2015 OT Individual Time: 0900-1000 OT Individual Time Calculation (min): 60 min    Short Term Goals: Week 2:  OT Short Term Goal 1 (Week 2): Pt will don shirt with min A OT Short Term Goal 2 (Week 2): Pt will don pants with mod A OT Short Term Goal 3 (Week 2): Pt will perform BSC transfers with max A OT Short Term Goal 4 (Week 2): Pt will perform toileting tasks with max A  Skilled Therapeutic Interventions/Progress Updates:    Pt engaged in BADL retraining including bathing and dressing at w/c level.  Pt required max A for squat pivot transfer bed>w/c to pt's R.  Pt required mod A for sit<>stand at sink to pull up pants.  Pt incorporating hemi bathing/dressing techniques independently but requires assistance to complete tasks.  Focus on activity tolerance, functional transfers, sit<>stand, standing balance, hemi bathing/dressing techniques, and safety awareness to increase independence with BADLs and reduce burden of care.   Therapy Documentation Precautions:  Precautions Precautions: Fall Precaution Comments: R lean, L ankle instability Required Braces or Orthoses: Other Brace/Splint (hand splint and PRAFO at night) Restrictions Weight Bearing Restrictions: No  Pain: Pain Assessment Pain Assessment: No/denies pain ADL: ADL ADL Comments: refer to functional navigator  See Function Navigator for Current Functional Status.   Therapy/Group: Individual Therapy  [name], [name] 12/25/2015, 12:11 PM

## 2015-12-25 NOTE — Progress Notes (Signed)
Speech Language Pathology Daily Session Note  Patient Details  Name: Sara [name] MRN: [id number] Date of Birth: January 09, 1981  Today's Date: 12/25/2015 SLP Individual Time: 1000-1030 SLP Individual Time Calculation (min): 30 min  Short Term Goals: Week 2: SLP Short Term Goal 1 (Week 2): Patient will demonstrate complex problem solving for functional and familiar tasks with supervision verbal cues.  SLP Short Term Goal 2 (Week 2): Patient will recall new, daily information with supervision verbal cues.  SLP Short Term Goal 3 (Week 2): Patient will self-monitor and correct erros during functional tasks with supervision verbal cues.  SLP Short Term Goal 4 (Week 2): Patient will attend to right field of enviornment with functional tasks with supervision verbal cues.   Skilled Therapeutic Interventions: Skilled treatment session focused on cognitive goals. SLP facilitated session by providing supervision question cues for a mildly complex problem solving task that focused on comparing/contasting large amounts of information and generating a list within specific constraints. Patient left upright in the wheelchair with all needs within reach. Continue with current plan of care.     Function:  Cognition Comprehension Comprehension assist level: Understands complex 90% of the time/cues 10% of the time  Expression   Expression assist level: Expresses complex 90% of the time/cues < 10% of the time  Social Interaction Social Interaction assist level: Interacts appropriately 90% of the time - Needs monitoring or encouragement for participation or interaction.  Problem Solving Problem solving assist level: Solves basic 90% of the time/requires cueing < 10% of the time  Memory Memory assist level: Recognizes or recalls 90% of the time/requires cueing < 10% of the time    Pain No/Denies Pain   Therapy/Group: Individual Therapy  Sara [name] 12/25/2015, 5:26 PM
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
## 2015-12-25 NOTE — Progress Notes (Signed)
Social Work [name], female   DOB: 09/20/1980, 34 y.o.   MRN: [id number]   Met with pt yesterday afternoon to review team conference.  Pt pleased with gains so far and aware that targeted d/c date remains for 6/2 with min assist goals.  Discussed need to confirm a d/c location and she asks that I talk with her mother.  Spoke with mother this morning who reports that they were thinking about having pt come to her apt, however, she is aware of the concern about stairs.  I have spoken with mother about this before and reiterated today that her apt is not a realistic d/c plan as she will absolutely not be able to do get up a flight of steps.  She agrees (again) and then reports that she will speak pt's sister and father to see what other options there may be.  She is to confirm plan with me this week.  , , LCSW  

## 2015-12-25 NOTE — Progress Notes (Signed)
Speech Language Pathology Daily Session Note  Patient Details  Name: [name] Xxxroyster MRN: [id number] Date of Birth: 1981/03/14  Today's Date: 12/25/2015 SLP Individual Time: 0830-0900 SLP Individual Time Calculation (min): 30 min  Short Term Goals: Week 2: SLP Short Term Goal 1 (Week 2): Patient will demonstrate complex problem solving for functional and familiar tasks with supervision verbal cues.  SLP Short Term Goal 2 (Week 2): Patient will recall new, daily information with supervision verbal cues.  SLP Short Term Goal 3 (Week 2): Patient will self-monitor and correct erros during functional tasks with supervision verbal cues.  SLP Short Term Goal 4 (Week 2): Patient will attend to right field of enviornment with functional tasks with supervision verbal cues.   Skilled Therapeutic Interventions: Pt seen for cognitive-linguistic therapy with focus on working memory and mathematic function. Pt was 95% acc with self-correction with single digit mixed-function math. Mod I to min A for working memory task involving manipulation of 3 items. Simple word problem required mod A to solve.Improvements noted today as compared with previous sessions re: working [employer] fast streamermemory. Encouraged pt with report of progress. .    Function:  Eating Eating   Modified Consistency Diet: No Eating Assist Level: Set up assist for   Eating Set Up Assist For: Opening containers       Cognition Comprehension Comprehension assist level: Understands complex 90% of the time/cues 10% of the time  Expression   Expression assist level: Expresses complex 90% of the time/cues < 10% of the time  Social Interaction Social Interaction assist level: Interacts appropriately 90% of the time - Needs monitoring or encouragement for participation or interaction.  Problem Solving Problem solving assist level: Solves basic 90% of the time/requires cueing < 10% of the time  Memory Memory assist level: Recognizes or recalls 90% of the  time/requires cueing < 10% of the time    Pain Pain Assessment Pain Assessment: No/denies pain  Therapy/Group: Individual Therapy  Rocky CraftsKara E Ortha Metts MA, CCC-SLP 12/25/2015, 1:12 PM

## 2015-12-25 NOTE — Progress Notes (Signed)
Physical Therapy Session Note  Patient Details  Name: Sara [name] MRN: [id number] Date of Birth: January 30, 1981  Today's Date: 12/25/2015 PT Individual Time: 1115-1200 and 1505-1530 PT Individual Time Calculation (min): 45 min and 25 min  Short Term Goals: Week 2:  PT Short Term Goal 1 (Week 2): Patient will perform bed mobility with consistent min A with use of rails.  PT Short Term Goal 2 (Week 2): Patient will transfer bed <> wheelchair with mod A.  PT Short Term Goal 3 (Week 2): Patient will perform sit <> stand with mod A.  PT Short Term Goal 4 (Week 2): Patient will ambulate x 50 ft with max A using LRAD.  PT Short Term Goal 5 (Week 2): Patient will maintain dynamic standing balance x 3 min with mod A.   Skilled Therapeutic Interventions/Progress Updates:   Treatment 1: Patient in wheelchair upon arrival, donned shoes with total A. Patient propelled using LUE to/from gym with max A for steering. Transitioned to tall kneeling on mat table with small bench +2A for safety with therapist facilitating RUE weightbearing on mat table and instructed in tall kneeling squats x 10 with focus on upright posture and hip extension to neutral. Patient fatigued and requesting seated rest after tall kneeling. Performed sit <> stand transfers from wheelchair using hemiwalker with min A overall. Patient reported enjoying volleyball as a hobby, performed dynamic standing balance hitting beach ball back and forth with min-mod A overall and tactile cues at hip and L knee to facilitate upright posture and equal WB up to 2-3 min at a time before requiring rest break due to fatigue. Gait training using L hemiwalker in controlled environment x 35 ft with mod A for balance and max-total A to advance/place RLE due to increased tone with improved placement of hemiwalker with no cues for sequencing, +2 for WC follow. Performed blocked practice squat pivot transfer wheelchair <> mat table with focus on wheelchair setup,  head hips relationship, and bottom clearance with mod faded to min A for transfers. Patient left sitting in wheelchair with all needs in reach.   Treatment 2: Session focused on functional squat pivot transfers wheelchair <> NuStep with mod-max A and RLE NMR and forced use via NuStep level 1-3 with RLE attachment to maintain neutral alignment x 10 min. Patient left sitting in wheelchair with all needs in reach.    Therapy Documentation Precautions:  Precautions Precautions: Fall Precaution Comments: R lean, L ankle instability Required Braces or Orthoses: Other Brace/Splint (hand splint and PRAFO at night) Restrictions Weight Bearing Restrictions: No Pain: Pain Assessment Pain Assessment: No/denies pain   See Function Navigator for Current Functional Status.   Therapy/Group: Individual Therapy  Kerney ElbeVarner, Sonnet Rizor A 12/25/2015, 12:12 PM
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
## 2015-12-25 NOTE — Progress Notes (Signed)
Warm soaks placed on surgical incision site twice prior to patient going to bed for the night. Scabbed area remains firmly in place.

## 2015-12-25 NOTE — Progress Notes (Signed)
Occupational Therapy Note  Patient Details  Name: Sara [name] MRN: [id number] Date of Birth: Oct 01, 1980  Today's Date: 12/25/2015 OT Individual Time: 1300-1400 OT Individual Time Calculation (min): 60 min   Pt denied pain Individual Therapy  Pt engaged in RUE NMR including weight bearing onto elbow and through RUE to facilitate active movement with RUE to assist with BADLs.  Pt not currently able to activate muscle groups in RUE/shoulder complex.  Attempted shoulder shrugs and scapula retraction/protraction sitting, supine, and side lying with not muscle activity noted.  Pt educated in SROM for RUE while seated and in supine.  Pt able to demonstrate successfully.  Pt engaged in bed mobility activities including rolling onto right and left as well as supine>-sit EOM.  Pt demonstrated proficiency in rolling to her left using appropriate techniques without assistance.  Pt performed squat pivot transfers to right and left X 2 with mod/max A and improved control through transfer.  Pt returned to room and remained in w/c with all needs within reach.   [name], Kainoa Swoboda [facility] 12/25/2015, 2:10 PM
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
## 2015-12-26 ENCOUNTER — Inpatient Hospital Stay (HOSPITAL_COMMUNITY): Payer: Self-pay

## 2015-12-26 ENCOUNTER — Inpatient Hospital Stay (HOSPITAL_COMMUNITY): Payer: Medicare Other | Admitting: Speech Pathology

## 2015-12-26 ENCOUNTER — Telehealth: Payer: Self-pay | Admitting: Family Medicine

## 2015-12-26 ENCOUNTER — Inpatient Hospital Stay (HOSPITAL_COMMUNITY): Payer: Medicare Other

## 2015-12-26 ENCOUNTER — Inpatient Hospital Stay (HOSPITAL_COMMUNITY): Payer: Medicare Other | Admitting: Physical Therapy

## 2015-12-26 ENCOUNTER — Inpatient Hospital Stay (HOSPITAL_COMMUNITY): Payer: Medicare Other | Admitting: *Deleted

## 2015-12-26 MED ORDER — CEPHALEXIN 250 MG PO CAPS
250.0000 mg | ORAL_CAPSULE | Freq: Three times a day (TID) | ORAL | Status: DC
  Administered 2015-12-26 – 2016-01-07 (×37): 250 mg via ORAL
  Filled 2015-12-26 (×37): qty 1

## 2015-12-26 NOTE — Progress Notes (Signed)
Occupational Therapy Session Note  Patient Details  Name: Sara [name] MRN: [id number] Date of Birth: [birth year]  Today's Date: 12/26/2015 OT Individual Time: 0900-1000 OT Individual Time Calculation (min): 60 min    Short Term Goals: Week 2:  OT Short Term Goal 1 (Week 2): Pt will don shirt with min A OT Short Term Goal 2 (Week 2): Pt will don pants with mod A OT Short Term Goal 3 (Week 2): Pt will perform BSC transfers with max A OT Short Term Goal 4 (Week 2): Pt will perform toileting tasks with max A  Skilled Therapeutic Interventions/Progress Updates:    Pt resting in bed upon arrival.  Pt engaged in BADL retraining including bathing and dressing with sit<>stand from w/c at sink.  Pt performed SPT bed>w/c with mod A and max verbal cues for sequencing. Pt independently initiates all hemi dressing techniques for bathing and dressing.  Pt able to don pullover shirt at supervision level this morning.  Pt required min A for sit<>stand at sink but unable to use LUE to assist with pulling up pants. Pt is able to cross her LLE and don sock and shoe but unable to thread her pants on her RLE or don sock and shoe.  Focus on activity tolerance, bed mobility, sit<>stand, standing balance, hemi bathing/dressing techniques and safety awareness to increase independence with BADLs.    Therapy Documentation Precautions:  Precautions Precautions: Fall Precaution Comments: R lean, L ankle instability Required Braces or Orthoses: Other Brace/Splint (hand splint and PRAFO at night) Restrictions Weight Bearing Restrictions: No  Pain:  Pt denied pain ADL: ADL ADL Comments: refer to functional navigator  See Function Navigator for Current Functional Status.   Therapy/Group: Individual Therapy  [name], Krysti Hickling Chappell 12/26/2015, 11:59 AM

## 2015-12-26 NOTE — Progress Notes (Signed)
Physical Therapy Weekly Progress Note  Patient Details  Name: Sara [name] MRN: [id number] Date of Birth: Aug 27, 1980  Beginning of progress report period: Dec 20, 2015 End of progress report period: Dec 26, 2015  Today's Date: 12/26/2015 PT Individual Time: 1007-1100 and 1430-1520 PT Individual Time Calculation (min): 53 min and 50 min  Patient has met 3 of 5 short term goals. Patient continues to make steady progress towards goals with improved functional transfers, ambulation with patient initiating advancement of RLE this date for first time, and progression to hemi height wheelchair resulting in increased functional independence with wheelchair propulsion. Patient remains limited by R hemiplegia and increased tone. Patient education ongoing and patient educated on need for all members of family who plan to provide assistance at discharge to complete training, patient verbalized understanding.   Patient continues to demonstrate the following deficits: R hemiplegia, weakness, decreased endurance, impaired timing and sequencing, abnormal tone, unbalanced muscle activation, decreased coordination, decreased midline orientation, decreased attention to R, decreased attention, decreased awareness, decreased problem solving, decreased safety awareness, decreased memory, delayed processing, decreased sitting balance, decreased standing balance, decreased postural control, and decreased balance strategies and therefore will continue to benefit from skilled PT intervention to enhance overall performance with activity tolerance, balance, postural control, ability to compensate for deficits, functional use of  right upper extremity and right lower extremity, awareness and coordination.  Patient progressing toward long term goals.  Continue plan of care.  PT Short Term Goals Week 2:  PT Short Term Goal 1 (Week 2): Patient will perform bed mobility with consistent min A with use of rails.  PT Short  Term Goal 1 - Progress (Week 2): Progressing toward goal PT Short Term Goal 2 (Week 2): Patient will transfer bed <> wheelchair with mod A.  PT Short Term Goal 2 - Progress (Week 2): Progressing toward goal PT Short Term Goal 3 (Week 2): Patient will perform sit <> stand with mod A.  PT Short Term Goal 3 - Progress (Week 2): Met PT Short Term Goal 4 (Week 2): Patient will ambulate x 50 ft with max A using LRAD.  PT Short Term Goal 4 - Progress (Week 2): Met PT Short Term Goal 5 (Week 2): Patient will maintain dynamic standing balance x 3 min with mod A.  PT Short Term Goal 5 - Progress (Week 2): Met  Skilled Therapeutic Interventions/Progress Updates:   Treatment 1: Session focused on functional squat pivot transfers to variety of surfaces with max A > mod A to R and L with focus on head hips relationship and overall technique, transition to hemi height wheelchair with supervision for propulsion via L hemi technique with min-mod verbal cues for sequencing in controlled environment including weaving between obstacles, and bed mobility on regular bed in ADL apartment with min-mod A overall and max verbal/visual cues for sequencing and technique. Patient left sitting in wheelchair with all needs in reach for OT session.   Treatment 2: Session focused on wheelchair propulsion via L hemi technique in controlled environment 2 x 250 ft with supervision, simulated car transfer to sedan height with max A and assist for safe wheelchair setup, functional ambulation using L hemiwalker x 15 ft with max A and patient able to advance RLE for first time but requiring total A for safe placement due to increased R adductor/extensor tone and max multimodal cues for sequencing turning with hemiwalker. Performed scooting along edge of mat to R side with mod cues for technique and bottom  clearance and min A overall. Transferred sit > supine with mod A to bring BLE on bed. Instructed in supine bridging with tactile cues to  activate R glute 2 x 10. Continued discharge planning with emphasis on ramp being built at father's home, DME needs, and obtaining home measurements. Patient requesting to return to bed, transferred sit > supine with mod A and patient assisted with repositioning higher in bed. Patient left semi reclined with all needs in reach.   Therapy Documentation Precautions:  Precautions Precautions: Fall Precaution Comments: R lean, L ankle instability Required Braces or Orthoses: Other Brace/Splint (hand splint and PRAFO at night) Restrictions Weight Bearing Restrictions: No Pain: Pain Assessment Pain Assessment: No/denies pain  See Function Navigator for Current Functional Status.  Therapy/Group: Individual Therapy  Laretta Alstrom 12/26/2015, 12:50 PM

## 2015-12-26 NOTE — Telephone Encounter (Signed)
Patients mom is calling to get a rx for a wheel chair if possible called into [facility]  Her name is [name] if any questions [phone]

## 2015-12-26 NOTE — Progress Notes (Signed)
Speech Language Pathology Daily Session Note  Patient Details  Name: Sara [name] MRN: [id number] Date of Birth: 28-Apr-1981  Today's Date: 12/26/2015 SLP Individual Time: 1345-1415 SLP Individual Time Calculation (min): 30 min  Short Term Goals: Week 2: SLP Short Term Goal 1 (Week 2): Patient will demonstrate complex problem solving for functional and familiar tasks with supervision verbal cues.  SLP Short Term Goal 2 (Week 2): Patient will recall new, daily information with supervision verbal cues.  SLP Short Term Goal 3 (Week 2): Patient will self-monitor and correct erros during functional tasks with supervision verbal cues.  SLP Short Term Goal 4 (Week 2): Patient will attend to right field of enviornment with functional tasks with supervision verbal cues.   Skilled Therapeutic Interventions: Skilled treatment session focused on cognition goals. SLP facilitated session by providing supervision verbal cues for familiar mildly complex problem solving task. Pt required supervision verbal cues for self-monitoring and correcting errors during task. Pt independently attended to right field during task. Patient was returned to room, left upright in wheelchair with all needs within reach. Continue with current plan of care.   Function:   Cognition Comprehension Comprehension assist level: Follows complex conversation/direction with extra time/assistive device  Expression   Expression assist level: Expresses complex ideas: With extra time/assistive device  Social Interaction Social Interaction assist level: Interacts appropriately with others with medication or extra time (anti-anxiety, antidepressant).  Problem Solving Problem solving assist level: Solves basic problems with no assist  Memory Memory assist level: Recognizes or recalls 90% of the time/requires cueing < 10% of the time    Pain Pain Assessment Pain Assessment: No/denies pain  Therapy/Group: Individual Therapy  Lajarvis Italiano 12/26/2015, 2:31 PM

## 2015-12-26 NOTE — Progress Notes (Signed)
Occupational Therapy Session Note  Patient Details  Name: Sara [name] MRN: [id number] Date of Birth: January 17, 1981  Today's Date: 12/26/2015 OT Individual Time: 1100-1200 OT Individual Time Calculation (min): 60 min   Short Term Goals: Week 2:  OT Short Term Goal 1 (Week 2): Pt will don shirt with min A OT Short Term Goal 2 (Week 2): Pt will don pants with mod A OT Short Term Goal 3 (Week 2): Pt will perform BSC transfers with max A OT Short Term Goal 4 (Week 2): Pt will perform toileting tasks with max A  Skilled Therapeutic Interventions/Progress Updates: NMES of RUE: forearm-based for wrist/finger/thumb extensor strengthening and upper-arm based for triceps extension x 15 min.    Using arm skate at table top, pt was able to visualized forward reach as NMES was applied to upper arm, facilitating extension at shoulder and elbow using arm skate.    Additional 15 min of NMES at forearm to facilitate wrist/finger/thumb extension.   Pt aware of placement of electrodes and able to maintain attention to device during treatment.   NMR of RUE with focus on bilateral activities, SROM and support of RUE during functional task.   Educated pt on transfer from w/c to straight chair however pt declined attempted transfer d/t fatigue.     Therapy Documentation Precautions:  Precautions Precautions: Fall Precaution Comments: R lean, L ankle instability Required Braces or Orthoses: Other Brace/Splint (hand splint and PRAFO at night) Restrictions Weight Bearing Restrictions: No  Pain: Pain Assessment Pain Assessment: No/denies pain   ADL: ADL ADL Comments: refer to functional navigator  See Function Navigator for Current Functional Status.   Therapy/Group: Individual Therapy  Reshma Hoey 12/26/2015, 2:46 PM
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
## 2015-12-27 ENCOUNTER — Inpatient Hospital Stay (HOSPITAL_COMMUNITY): Payer: Medicare Other | Admitting: Physical Therapy

## 2015-12-27 ENCOUNTER — Inpatient Hospital Stay (HOSPITAL_COMMUNITY): Payer: Medicare Other

## 2015-12-27 ENCOUNTER — Inpatient Hospital Stay (HOSPITAL_COMMUNITY): Payer: Medicare Other | Admitting: Speech Pathology

## 2015-12-27 NOTE — Progress Notes (Signed)
Physical Therapy Session Note  Patient Details  Name: Sara [name] MRN: [id number] Date of Birth: [birth year]  Today's Date: 12/27/2015 PT Individual Time: 1100-1158 PT Individual Time Calculation (min): 58 min   Short Term Goals: Week 3:  PT Short Term Goal 1 (Week 3): Patient will perform bed mobility with mod A and min cues for sequencing and technique.  PT Short Term Goal 2 (Week 3): Patient will consistently transfer bed <> wheelchair with mod A.  PT Short Term Goal 3 (Week 3): Patient will ambulate x 50 ft with max A using LRAD and assist to place RLE 50% of time. PT Short Term Goal 4 (Week 3): Patient will perform sit <> stand with consistent min A.  PT Short Term Goal 5 (Week 3): Patient will maintain dynamic standing balance x 5 min with min A.   Skilled Therapeutic Interventions/Progress Updates:    Pt received in w/c in handoff from SLP; pt agreeable to PT denying c/o pain. Gait training in hallway x 35 ft with L hemiwalker & Max A to correct R lean & advance RLE. Pt able to weight shift to L with maximum verbal cuing & tactile cuing; pt unable to activate quads advance RLE & pt limited by increased tone with movement. At steps pt performed quick stepping with 1 or BUE support, small steps with L, progressing to larger steps & then stepping up onto single 6 inch step to increase weight bearing on RLE. Pt required cuing for shoulder shift to R & R knee stabilization to prevent buckling & hyperextension. Continued gait training in hallway x 35 ft with L hemiwalker & pt able to increase weight shift to L & decreased tone in RLE to allow PT increased ease of assisting with step. Pt educated on w/c mobility over ramp in [employer] & ramp in ortho gym. Pt able to negotiate north tower ramp with supervision & controlled descent, but pt required Mod A to negotiate ramp in ortho gym x 3 trials. Provided maximum cuing for pt to utilize LLE to assist with steering but pt with difficulty on  steep incline. Pt self propelled w/c ortho gym>room with L hemi technique. Pt left in w/c with RUE elevated on pillow & all needs within reach.   Therapy Documentation Precautions:  Precautions Precautions: Fall Precaution Comments: R lean, L ankle instability Required Braces or Orthoses: Other Brace/Splint (hand splint and PRAFO at night) Restrictions Weight Bearing Restrictions: No  Pain: Pain Assessment Pain Assessment: No/denies pain   See Function Navigator for Current Functional Status.   Therapy/Group: Individual Therapy  Sandi MariscalVictoria M Jahmeek Shirk 12/27/2015, 12:02 PM

## 2015-12-27 NOTE — Progress Notes (Signed)
Speech Language Pathology Weekly Progress and Session Note  Patient Details  Name: Sara [name] MRN: [id number] Date of Birth: 1980-10-13  Beginning of progress report period: Dec 20, 2015 End of progress report period: Dec 27, 2015  Today's Date: 12/27/2015 SLP Individual Time: 1000-1100  SLP Individual Time: 9449-6759 SLP Individual Time Calculation (min): 90 min  Short Term Goals: Week 2: SLP Short Term Goal 1 (Week 2): Patient will demonstrate complex problem solving for functional and familiar tasks with supervision verbal cues.  SLP Short Term Goal 1 - Progress (Week 2): Progressing toward goal SLP Short Term Goal 2 (Week 2): Patient will recall new, daily information with supervision verbal cues.  SLP Short Term Goal 2 - Progress (Week 2): Met SLP Short Term Goal 3 (Week 2): Patient will self-monitor and correct erros during functional tasks with supervision verbal cues.  SLP Short Term Goal 3 - Progress (Week 2): Progressing toward goal SLP Short Term Goal 4 (Week 2): Patient will attend to right field of enviornment with functional tasks with supervision verbal cues.  SLP Short Term Goal 4 - Progress (Week 2): Met    New Short Term Goals: Week 3: SLP Short Term Goal 1 (Week 3): Pt will navigate the use the computer/internet with min A.  SLP Short Term Goal 2 (Week 3): Patient will demonstrate complex reasoning and problem solving for novel tasks with mod A.  SLP Short Term Goal 3 (Week 3): Patient will self-monitor and correct errors during complex tasks with mod A.  SLP Short Term Goal 4 (Week 3): Patient will attend to right field of environment with complex visual tasks at mod I.   Weekly Progress Updates:  Pt has continued to make excellent gains in speech therapy for improving independence with functional tasks.   Intensity: Minumum of 1-2 x/day, 30 to 90 minutes Frequency: 3 to 5 out of 7 days Duration/Length of Stay: 6/2 Treatment/Interventions: Cognitive  remediation/compensation;Speech/Language facilitation;Functional tasks;Therapeutic Activities;Cueing hierarchy;Therapeutic Exercise;Medication managment;Patient/family education   Daily Session  Skilled Therapeutic Interventions: Pt seen for 2 sessions. First session focused on functional math and reasoning. Pt required min- mod A for reasoning with word problems. Pt was generally able to identify errors, but was unable to problem solve how to fix the errors. Able to recall pertinent information from previous sessions.  Second session focused on self awareness for goal setting. Pt was able to identify areas where she continues to need support. Pt benefited from min A to initiate ideas, however she was able to independently expound upon the initial thoughts provided.     Function:   Eating Eating                 Cognition Comprehension Comprehension assist level: Understands complex 90% of the time/cues 10% of the time  Expression   Expression assist level: Expresses complex ideas: With extra time/assistive device  Social Interaction Social Interaction assist level: Interacts appropriately with others with medication or extra time (anti-anxiety, antidepressant).  Problem Solving Problem solving assist level: Solves basic problems with no assist  Memory Memory assist level: Recognizes or recalls 90% of the time/requires cueing < 10% of the time   General    Pain Pain Assessment Pain Assessment: No/denies pain  Therapy/Group: Individual Therapy  Vinetta Bergamo MA, CCC-SLP 12/27/2015, 3:18 PM

## 2015-12-27 NOTE — Progress Notes (Signed)
Occupational Therapy Weekly Progress Note  Patient Details  Name: Sara [name] MRN: [id number] Date of Birth: 1981-06-08  Beginning of progress report period: Dec 20, 2015 End of progress report period: Dec 27, 2015   Patient has met 2 of 4 short term goals.  Pt made steady progress with BADLs this past week but continues to require tot A for toileting tasks and max A for donning pants with sit<>stand from w/c.  Pt independently incorporates learned hemi bathing and dressing techniques to increase independence with BADLs.  Pt continues to exhibit significant tone in her RUE with not active movement noted.   Patient continues to demonstrate the following deficits: RUE hemiparesis with hypertone, decreased standing balance due to RLE hypertone, slow processing and memory  and therefore will continue to benefit from skilled OT intervention to enhance overall performance with BADL.  Patient progressing toward long term goals..  Continue plan of care.  OT Short Term Goals Week 2:  OT Short Term Goal 1 (Week 2): Pt will don shirt with min A OT Short Term Goal 2 (Week 2): Pt will don pants with mod A OT Short Term Goal 2 - Progress (Week 2): Progressing toward goal OT Short Term Goal 3 (Week 2): Pt will perform BSC transfers with max A OT Short Term Goal 3 - Progress (Week 2): Met OT Short Term Goal 4 (Week 2): Pt will perform toileting tasks with max A OT Short Term Goal 4 - Progress (Week 2): Progressing toward goal Week 3:  OT Short Term Goal 1 (Week 3): Pt will don pants with mod A OT Short Term Goal 2 (Week 3): Pt will perform toileting tasks with max A OT Short Term Goal 3 (Week 3): Pt will complete bathing tasks with mod A sit<>stand from w/c at sink OT Short Term Goal 4 (Week 3): Pt will perform BSC transfers with mod A      Therapy Documentation Precautions:  Precautions Precautions: Fall Precaution Comments: R lean, L ankle instability Required Braces or Orthoses: Other  Brace/Splint (hand splint and PRAFO at night) Restrictions Weight Bearing Restrictions: No  Pain: Pain Assessment Pain Assessment: No/denies pain ADL: ADL ADL Comments: refer to functional navigator  Leroy Libman 12/27/2015, 3:09 PM

## 2015-12-27 NOTE — Progress Notes (Signed)
Occupational Therapy Note  Patient Details  Name: Sara [name] MRN: [id number] Date of Birth: 05/11/1981  Today's Date: 12/27/2015 OT Individual Time: 1300-1400 OT Individual Time Calculation (min): 60 min   Pt denied pain Individual Therapy  Pt resting in w/c upon arrival with sister and mother present.  Pt's sister and mother observed therapy session this morning.  Pt propelled to gym and performed SPT to therapy mat with max A.  Pt engaged in sitting balance activities, sit<>stand, and standing balance tasks using her LUE while her R knee/RLE supported by therapist.  Increased tone and clonus noted in pt's RLE.  Pt was scheduled for Baclofen after therapy session.  Pt exhibited improved standing balance this afternoon.     Sara [name], Sara [name] [facility] 12/27/2015, 2:54 PM

## 2015-12-27 NOTE — Progress Notes (Signed)
Occupational Therapy Session Note  Patient Details  Name: Sara [name] MRN: [id number] Date of Birth: October 30, 1980  Today's Date: 12/27/2015 OT Individual Time: 0900-1000 OT Individual Time Calculation (min): 60 min    Short Term Goals: Week 2:  OT Short Term Goal 1 (Week 2): Pt will don shirt with min A OT Short Term Goal 2 (Week 2): Pt will don pants with mod A OT Short Term Goal 3 (Week 2): Pt will perform BSC transfers with max A OT Short Term Goal 4 (Week 2): Pt will perform toileting tasks with max A  Skilled Therapeutic Interventions/Progress Updates:    Pt resting in bed upon arrival.  Pt engaged in BADL retraining including bed mobility, functional transfers (bed>w/c), and bathing/dressing with sit<>stand from w/c at sink.  Pt continues to exhibit increased use of hemi bathing and dressing techniques with improved success.  Pt stood at sink to bathe buttocks and attempt to pull up pants.  Pt required mod A for standing with support provided at R knee while standing.  Focus on activity tolerance, sit<>stand, standing balance, functional transfers, and safety awareness to increase independence with BADLs.  Therapy Documentation Precautions:  Precautions Precautions: Fall Precaution Comments: R lean, L ankle instability Required Braces or Orthoses: Other Brace/Splint (hand splint and PRAFO at night) Restrictions Weight Bearing Restrictions: No Pain: Pain Assessment Pain Assessment: No/denies pain ADL: ADL ADL Comments: refer to functional navigator  See Function Navigator for Current Functional Status.   Therapy/Group: Individual Therapy  [name], Suesan Mohrmann Chappell 12/27/2015, 10:05 AM
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
## 2015-12-28 ENCOUNTER — Inpatient Hospital Stay (HOSPITAL_COMMUNITY): Payer: Self-pay | Admitting: Physical Therapy
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
## 2015-12-28 NOTE — Procedures (Signed)
Placed patient on CPAP for the night.  Patient is tolerating well at this time. 

## 2015-12-28 NOTE — Progress Notes (Signed)
Physical Therapy Session Note  Patient Details  Name: Sara [name] MRN: [id number] Date of Birth: 11/03/1980  Today's Date: 12/28/2015 PT Individual Time: 1300-1330 PT Individual Time Calculation (min): 30 min   Short Term Goals: Week 3:  PT Short Term Goal 1 (Week 3): Patient will perform bed mobility with mod A and min cues for sequencing and technique.  PT Short Term Goal 2 (Week 3): Patient will consistently transfer bed <> wheelchair with mod A.  PT Short Term Goal 3 (Week 3): Patient will ambulate x 50 ft with max A using LRAD and assist to place RLE 50% of time. PT Short Term Goal 4 (Week 3): Patient will perform sit <> stand with consistent min A.  PT Short Term Goal 5 (Week 3): Patient will maintain dynamic standing balance x 5 min with min A.   Skilled Therapeutic Interventions/Progress Updates:   Pt received resting in w/c with family present and agreeable to therapy session.  Oral care at sink independently prior to leaving room.  Pt propels w/c to/from therapy gym overall mod I.  Attempted gait training x2' with hemiwalker but pt significantly inhibited in RLE swing through by tone.  Transitioned to RLE NMR via forced weight bearing for rapid step ups onto 6" step with L foot and max assist to maintain balance.  Pt completed 2 trials of rapid step ups until fatigue (~40-50 step ups each trial).  Gait training with hemiwalker x20' with mild improvement in tone, better with increased gait speed, but pt became dizzy and required to slow down and tone increased.  PT provided pt and family education throughout session regarding tone and techniques to manage including hip flexion, tapping, stroking, rotation, side lying, etc.  Pt returned to room in w/c at end of session and left up right with needs met.   Therapy Documentation Precautions:  Precautions Precautions: Fall Precaution Comments: R lean, L ankle instability Required Braces or Orthoses: Other Brace/Splint (hand splint  and PRAFO at night) Restrictions Weight Bearing Restrictions: No   See Function Navigator for Current Functional Status.   Therapy/Group: Individual Therapy   E Penven-Crew 12/28/2015, 3:45 PM  

## 2015-12-29 ENCOUNTER — Inpatient Hospital Stay (HOSPITAL_COMMUNITY): Payer: Medicare Other | Admitting: Occupational Therapy

## 2015-12-29 NOTE — Progress Notes (Signed)
Occupational Therapy Session Note  Patient Details  Name: Sara [name] MRN: [id number] Date of Birth: 03-09-1981  Today's Date: 12/29/2015 OT Individual Time:  -   1015-1100  (45 min)      Short Term Goals: Week 1:  OT Short Term Goal 1 (Week 1): Pt will complete squat pivot transfers to max A of 1. OT Short Term Goal 1 - Progress (Week 1): Met OT Short Term Goal 2 (Week 1): Pt will don shirt with min A. OT Short Term Goal 2 - Progress (Week 1): Progressing toward goal OT Short Term Goal 3 (Week 1): Pt will don pants with mod A. OT Short Term Goal 3 - Progress (Week 1): Progressing toward goal OT Short Term Goal 4 (Week 1): Pt will perform RUE self ROM with mod cues. OT Short Term Goal 4 - Progress (Week 1): Met OT Short Term Goal 5 (Week 1): Pt will sit to stand at sink with max A of 1. OT Short Term Goal 5 - Progress (Week 1): Met Week 2:  OT Short Term Goal 1 (Week 2): Pt will don shirt with min A OT Short Term Goal 2 (Week 2): Pt will don pants with mod A OT Short Term Goal 2 - Progress (Week 2): Progressing toward goal OT Short Term Goal 3 (Week 2): Pt will perform BSC transfers with max A OT Short Term Goal 3 - Progress (Week 2): Met OT Short Term Goal 4 (Week 2): Pt will perform toileting tasks with max A OT Short Term Goal 4 - Progress (Week 2): Progressing toward goal Week 3:  OT Short Term Goal 1 (Week 3): Pt will don pants with mod A OT Short Term Goal 2 (Week 3): Pt will perform toileting tasks with max A OT Short Term Goal 3 (Week 3): Pt will complete bathing tasks with mod A sit<>stand from w/c at sink OT Short Term Goal 4 (Week 3): Pt will perform BSC transfers with mod A  Skilled Therapeutic Interventions/Progress Updates:    Pt supine.  Practiced rolling to left and coming up to sitting with mod assist.  Stand pivot to wc with mod to min assist.  Pt performed bathing at dressing at sink.  Did standing balance for 2-3 minutes with manual support to RLE during  activity.  Practiced WB thru RUE throughout session for increased proprioception.  Addressed transfers, sit to stand, NMRE, strength, ROM.  Pt.left in room with all items in reach.   Therapy Documentation Precautions:  Precautions Precautions: Fall Precaution Comments: R lean, L ankle instability Required Braces or Orthoses: Other Brace/Splint (hand splint and PRAFO at night) Restrictions Weight Bearing Restrictions: No    Vital Signs: Therapy Vitals Temp: 98.4 F (36.9 C) Temp Source: Oral Pulse Rate: 63 Resp: 18 BP: (!) 102/40 mmHg Patient Position (if appropriate): Lying Oxygen Therapy SpO2: 100 % O2 Device: Not Delivered Pain: Pain Assessment Pain Assessment: 0-10 Pain Score: 4  Pain Type: Acute pain Pain Location: Head Pain Descriptors / Indicators: Aching Pain Frequency: Intermittent Patients Stated Pain Goal: 4 Pain Intervention(s): Medication (See eMAR) ADL: ADL ADL Comments: refer to functional navigator     See Function Navigator for Current Functional Status.   Therapy/Group: Individual Therapy  Lisa Roca 12/29/2015, 8:00 AM
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
## 2015-12-30 ENCOUNTER — Inpatient Hospital Stay (HOSPITAL_COMMUNITY): Payer: Self-pay | Admitting: *Deleted

## 2015-12-30 ENCOUNTER — Inpatient Hospital Stay (HOSPITAL_COMMUNITY): Payer: Self-pay | Admitting: Physical Therapy

## 2015-12-30 ENCOUNTER — Inpatient Hospital Stay (HOSPITAL_COMMUNITY): Payer: Medicare Other

## 2015-12-30 ENCOUNTER — Inpatient Hospital Stay (HOSPITAL_COMMUNITY): Payer: Medicare Other | Admitting: Speech Pathology

## 2015-12-30 NOTE — Progress Notes (Signed)
Speech Language Pathology Daily Session Note  Patient Details  Name: Sara [name] MRN: [id number] Date of Birth: 09/16/1980  Today's Date: 12/30/2015 SLP Individual Time: 1500-1530 SLP Individual Time Calculation (min): 30 min and Today's Date: 12/30/2015 SLP Missed Time: 30 Minutes Missed Time Reason: Unavailable (Comment)  Short Term Goals: Week 3: SLP Short Term Goal 1 (Week 3): Pt will navigate the use the computer/internet with min A.  SLP Short Term Goal 2 (Week 3): Patient will demonstrate complex reasoning and problem solving for novel tasks with mod A.  SLP Short Term Goal 3 (Week 3): Patient will self-monitor and correct errors during complex tasks with mod A.  SLP Short Term Goal 4 (Week 3): Patient will attend to right field of environment with complex visual tasks at mod I.   Skilled Therapeutic Interventions: Skilled treatment session focused on cognitive goals. Patient independently recalled 30% her current medications but was able to recall the functions of all her medications with 100% accuracy. Patient also initiated organizing a 4 time per day pill box with supervision verbal cues which will be completed at next visit. Patient propelled herself to and from the dayroom with Mod I. Patient missed initial 30 minutes of session due to meeting with a detective. Patient left with all needs within reach. Continue with current plan of care.    Function:  Cognition Comprehension Comprehension assist level: Understands complex 90% of the time/cues 10% of the time  Expression   Expression assist level: Expresses complex ideas: With extra time/assistive device  Social Interaction Social Interaction assist level: Interacts appropriately with others with medication or extra time (anti-anxiety, antidepressant).  Problem Solving Problem solving assist level: Solves basic problems with no assist  Memory Memory assist level: Recognizes or recalls 90% of the time/requires cueing < 10%  of the time    Pain Pain Assessment Pain Assessment: No/denies pain  Therapy/Group: Individual Therapy  Caily Rakers 12/30/2015, 4:09 PM

## 2015-12-30 NOTE — Progress Notes (Signed)
Physical Therapy Session Note  Patient Details  Name: Sara [name] MRN: [id number] Date of Birth: [birth year]  Today's Date: 12/30/2015 PT Individual Time: [id number] PT Individual Time Calculation (min): 30 min   Short Term Goals: Week 3:  PT Short Term Goal 1 (Week 3): Patient will perform bed mobility with mod A and min cues for sequencing and technique.  PT Short Term Goal 2 (Week 3): Patient will consistently transfer bed <> wheelchair with mod A.  PT Short Term Goal 3 (Week 3): Patient will ambulate x 50 ft with max A using LRAD and assist to place RLE 50% of time. PT Short Term Goal 4 (Week 3): Patient will perform sit <> stand with consistent min A.  PT Short Term Goal 5 (Week 3): Patient will maintain dynamic standing balance x 5 min with min A.   Skilled Therapeutic Interventions/Progress Updates:  Tx focused on NMR and stretching for tone reduction to increased independence with all funcitonal mobiltiy. [facility]  NMR via reciprocal scooting to edge of chair.  Squat-pivot transfer with Mod A, pt directing self cares, but needing cues to sustain squat and anterior translation over BOS.  Sit>supine with min A for 1 LE. [facility].   NMR performed via rhythmic rocking for tone reduction at all joints of RLE. Pt's tone was most easily broken after stretching R ankle into DF.  Therex via stretching all joints of RLE in supine and sidelying x772min each. Pt able to assist in tailor's stretch, long sitting HS stretch, and hip to opposite shoulder.  Pt left in bed with all needs in reach.      Therapy Documentation Precautions:  Precautions Precautions: Fall Precaution Comments: R lean, L ankle instability Required Braces or Orthoses: Other Brace/Splint (hand splint and PRAFO at night) Restrictions Weight Bearing Restrictions: No General:   Vital Signs:  Pain: Pain Assessment Pain Assessment: No/denies pain   See Function Navigator for  Current Functional Status.   Therapy/Group: Individual Therapy  [name], [name] 12/30/2015, 2:29 PM

## 2015-12-30 NOTE — Progress Notes (Signed)
Occupational Therapy Note  Patient Details  Name: Sara [name] MRN: [id number] Date of Birth: 1981/01/10  Today's Date: 12/30/2015 OT Individual Time: 1300-1400 OT Individual Time Calculation (min): 60 min   Pt denied pain Individual therapy  Pt resting in w/c upon arrival with sister and mother present (observing therapy). Pt propelled w/c to therapy gym.  Practiced scoot transfers to mat with emphasis on technique and sequencing.  Pt continues to require max verbal cues to assure proper anterior weight shift to enable her to clear her buttocks adequately to scoot.  Pt engaged in dynamic sitting tasks, weight bearing through her RUE, bed mobility on mat, lateral scooting, and sit<>stand/standing from EOM. Pt requires mod verbal cues for technique with sit<>stand and lateral scooting. Pt propelled back to her room and remained in w/c with all needs within reach and family present.    [name], Kandis [facility] Hospital[facility]appell 12/30/2015, 3:06 PM
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
## 2015-12-30 NOTE — Progress Notes (Signed)
Occupational Therapy Session Note  Patient Details  Name: Sara [name] MRN: [id number] Date of Birth: 10-09-1980  Today's Date: 12/30/2015 OT Individual Time: 0900-1000 OT Individual Time Calculation (min): 60 min    Short Term Goals: Week 3:  OT Short Term Goal 1 (Week 3): Pt will don pants with mod A OT Short Term Goal 2 (Week 3): Pt will perform toileting tasks with max A OT Short Term Goal 3 (Week 3): Pt will complete bathing tasks with mod A sit<>stand from w/c at sink OT Short Term Goal 4 (Week 3): Pt will perform BSC transfers with mod A  Skilled Therapeutic Interventions/Progress Updates:    Pt engaged in BADL retraining including bathing at shower level and dressing with sit<>stand from w/c at sink.  Pt independently incorporate hemi bathing and dressing techniques during bathing and dressing.  Pt requires mod A for sit<>stand to pull up pants but continues to require assistance with pulling up pants.  Pt completes grooming tasks with supervision while seated in w/c at sink.  Focus on activity tolerance, functional transfers, sit<>stand, standing balance, sitting balance, and safety awareness to increase independence with BADLs.   Therapy Documentation Precautions:  Precautions Precautions: Fall Precaution Comments: R lean, L ankle instability Required Braces or Orthoses: Other Brace/Splint (hand splint and PRAFO at night) Restrictions Weight Bearing Restrictions: No   Pain:  Pt denied pain ADL: ADL ADL Comments: refer to functional navigator  See Function Navigator for Current Functional Status.   Therapy/Group: Individual Therapy  [name], [name] 12/30/2015, 10:06 AM

## 2015-12-31 ENCOUNTER — Inpatient Hospital Stay (HOSPITAL_COMMUNITY): Payer: Medicare Other | Admitting: Physical Therapy

## 2015-12-31 ENCOUNTER — Inpatient Hospital Stay (HOSPITAL_COMMUNITY): Payer: Medicare Other | Admitting: Speech Pathology

## 2015-12-31 ENCOUNTER — Inpatient Hospital Stay (HOSPITAL_COMMUNITY): Payer: Self-pay | Admitting: Physical Therapy

## 2015-12-31 ENCOUNTER — Inpatient Hospital Stay (HOSPITAL_COMMUNITY): Payer: Self-pay

## 2015-12-31 DIAGNOSIS — S0100XD Unspecified open wound of scalp, subsequent encounter: Secondary | ICD-10-CM

## 2015-12-31 NOTE — Progress Notes (Signed)
Physical Therapy Session Note  Patient Details  Name: Sara [name] Xxxroyster MRN: [id number] Date of Birth: 1980/12/04  Today's Date: 12/31/2015 PT Individual Time: 1050-1116 PT Individual Time Calculation (min): 26 min   Short Term Goals: Week 3:  PT Short Term Goal 1 (Week 3): Patient will perform bed mobility with mod A and min cues for sequencing and technique.  PT Short Term Goal 2 (Week 3): Patient will consistently transfer bed <> wheelchair with mod A.  PT Short Term Goal 3 (Week 3): Patient will ambulate x 50 ft with max A using LRAD and assist to place RLE 50% of time. PT Short Term Goal 4 (Week 3): Patient will perform sit <> stand with consistent min A.  PT Short Term Goal 5 (Week 3): Patient will maintain dynamic standing balance x 5 min with min A.   Skilled Therapeutic Interventions/Progress Updates:   Pt received on the toilet with the Stedy placed in front of her.  Pt requesting assistance for hygiene and to transfer off toilet.  Pt performed sit > stand from toilet with Stedy and min A.  Pt able to maintain standing with Stedy and supervision with verbal cues for weight shifting while therapist assisted with hygiene and donning of clothing.  Once pt back in w/[name] proceeded with exercises to focus on visual impairments including accomodation and convergence/divergence.  Pt performed monocular R and L eye accomodation/convergence while reading lines of Snellen eye chart at 50% of original size at near focus and then regular size at 706ft away.  As font became smaller pt noted to be skipping every other letter.  Also educated pt on and performed x 1 viewing in sitting with target 3 ft away to focus on gaze stabilization during head movements.  Handouts to be given to pt by primary PT and OT.  Will continued to address visual impairments and visual-vestibular interaction impairments.  Therapy Documentation Precautions:  Precautions Precautions: Fall Precaution Comments: R lean, L ankle  instability Required Braces or Orthoses: Other Brace/Splint (hand splint and PRAFO at night) Restrictions Weight Bearing Restrictions: No   See Function Navigator for Current Functional Status.   Therapy/Group: Individual Therapy  [name], Libia Fazzini [facility] 12/31/2015, 12:13 PM
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
## 2015-12-31 NOTE — Progress Notes (Signed)
Physical Therapy Session Note  Patient Details  Name: Sara SchaumannDebra C Frieden MRN: [id number] Date of Birth: [birth year]  Today's Date: 12/31/2015 PT Individual Time: 1300-1410 PT Individual Time Calculation (min): 70 min   Short Term Goals: Week 3:  PT Short Term Goal 1 (Week 3): Patient will perform bed mobility with mod A and min cues for sequencing and technique.  PT Short Term Goal 2 (Week 3): Patient will consistently transfer bed <> wheelchair with mod A.  PT Short Term Goal 3 (Week 3): Patient will ambulate x 50 ft with max A using LRAD and assist to place RLE 50% of time. PT Short Term Goal 4 (Week 3): Patient will perform sit <> stand with consistent min A.  PT Short Term Goal 5 (Week 3): Patient will maintain dynamic standing balance x 5 min with min A.   Skilled Therapeutic Interventions/Progress Updates:   Patient in wheelchair, propelled to/from gym via L hemi technique with mod I in controlled environment. Sit <> stand from EOM blocked practice using hemiwalker x 10 with supervision-steady assist and verbal/tactile cues for R foot placement and equal WB BLE. Patient's father present to observe session. Gait training in controlled environment using L hemiwalker x 55 ft including 2 turns with max A due to increased RLE extensor/adductor tone, patient able to initiate minimal R hip flexion during swing phase. Initiated family education with patient and father on goals and current progress and discussed that patient will most likely be ambulating with therapy only and not family at DC due to increased tone, verbalized understanding. Education provided on RLE tone, clonus, and tone reduction strategies. Practiced bed mobility on mat table to L with min A progressed to supervision overall and max faded to min cues for sequencing and technique including attention to RUE. Discussed cues for reciprocal scooting, bed mobility, and sit <> stand transfers with patient and father. Patient's father deferred  hands-on training at this time. Performed blocked practice squat pivot transfers to R and L wheelchair <> mat table with mod > min A and cues for head hips relationship and anterior translation over BOS. Patient requesting to return to bed, transferred to bed with mod A and sit > supine with min A to bring RLE on bed. Performed bridging with total A to position R knee in hooklying position to reposition in bed. Patient left semi reclined with needs in reach, father and RN present.     Therapy Documentation Precautions:  Precautions Precautions: Fall Precaution Comments: R lean, L ankle instability Required Braces or Orthoses: Other Brace/Splint (hand splint and PRAFO at night) Restrictions Weight Bearing Restrictions: No Pain: Pain Assessment Pain Assessment: No/denies pain  See Function Navigator for Current Functional Status.   Therapy/Group: Individual Therapy  Kerney ElbeVarner, Jarrid Lienhard A 12/31/2015, 5:13 PM

## 2015-12-31 NOTE — Progress Notes (Signed)
Speech Language Pathology Daily Session Note  Patient Details  Name: Sara [name] MRN: [id number] Date of Birth: 16-Dec-1980  Today's Date: 12/31/2015 SLP Individual Time: 1000-1045 SLP Individual Time Calculation (min): 45 min  Short Term Goals: Week 3: SLP Short Term Goal 1 (Week 3): Pt will navigate the use the computer/internet with min A.  SLP Short Term Goal 2 (Week 3): Patient will demonstrate complex reasoning and problem solving for novel tasks with mod A.  SLP Short Term Goal 3 (Week 3): Patient will self-monitor and correct errors during complex tasks with mod A.  SLP Short Term Goal 4 (Week 3): Patient will attend to right field of environment with complex visual tasks at mod I.   Skilled Therapeutic Interventions: Skilled treatment session focused on cognitive goals. SLP facilitated session by providing supervision verbal cues for problem solving while completing a complex medication management task with a four time per day pill box and was independent for recall of medication to restart task from yesterday' session. Patient demonstrated divided attention between functional conversation and sorting task with Mod I. Patient left sitting upright in wheelchair with all needs within reach. Continue with current plan of care.    Function:  Cognition Comprehension Comprehension assist level: Understands complex 90% of the time/cues 10% of the time  Expression   Expression assist level: Expresses complex ideas: With extra time/assistive device  Social Interaction Social Interaction assist level: Interacts appropriately with others with medication or extra time (anti-anxiety, antidepressant).  Problem Solving Problem solving assist level: Solves complex 90% of the time/cues < 10% of the time  Memory Memory assist level: Recognizes or recalls 90% of the time/requires cueing < 10% of the time    Pain No/Denies Pain   Therapy/Group: Individual Therapy  Tashiya Souders,  Kiyoshi Schaab 12/31/2015, 12:40 PM

## 2015-12-31 NOTE — Progress Notes (Signed)
Occupational Therapy Session Note  Patient Details  Name: Sara [name] MRN: [id number] Date of Birth: February 20, 1981  Today's Date: 12/31/2015 OT Individual Time: 0900-1000 OT Individual Time Calculation (min): 60 min    Short Term Goals: Week 3:  OT Short Term Goal 1 (Week 3): Pt will don pants with mod A OT Short Term Goal 2 (Week 3): Pt will perform toileting tasks with max A OT Short Term Goal 3 (Week 3): Pt will complete bathing tasks with mod A sit<>stand from w/c at sink OT Short Term Goal 4 (Week 3): Pt will perform BSC transfers with mod A  Skilled Therapeutic Interventions/Progress Updates:    Pt resting in bed upon arrival.  Pt c/o increased pain in R secondary to increased tone. Pt engaged in BADL retraining including bathing and dressing with sit<>stand from w/c at sink. Pt incorporates hemi dressing and bathing techniques independently.  Pt was able to don her bra and shirt without assistance this morning.  Pt continues to require assistance with sit<>stand and pulling up her pants. Focus on activity tolerance, sit<>stand, standing balance, bed mobility, and safety awareness to increase independence with BADLs.   Therapy Documentation Precautions:  Precautions Precautions: Fall Precaution Comments: R lean, L ankle instability Required Braces or Orthoses: Other Brace/Splint (hand splint and PRAFO at night) Restrictions Weight Bearing Restrictions: No  Pain:  Pt c/o soreness/pain in R hand this morning.  Pt's R hand with increased flexor tone in digits; stretching, repositioning, and emotional support provided ADL: ADL ADL Comments: refer to functional navigator  See Function Navigator for Current Functional Status.   Therapy/Group: Individual Therapy  [name], Param Capri Chappell 12/31/2015, 12:01 PM

## 2015-12-31 NOTE — Progress Notes (Signed)
Occupational Therapy Note  Patient Details  Name: Sara [name] MRN: [id number] Date of Birth: 1980-11-20  Today's Date: 12/31/2015 OT Individual Time: 1300-1400 OT Individual Time Calculation (min): 60 min   Pt denied pain Individual Therapy  Pt propelled to ADL apartment and practiced squat pivot transfers w/c<>bed X 3 and bed mobility (rolling right and left and supine<>sit EOB rolling to pt's right). Pt also practiced donning shoe on her L foot and practiced fastening with use of shoe button.  Pt required min A for bed mobility tasks and mod A for transfers.   Sara [name], Sara [name] [facility] 12/31/2015, 3:10 PM

## 2015-12-31 NOTE — Progress Notes (Signed)
The scabs on top of the patient's head were covered with warm wet washcloths and allowed to soak for 10 minutes. After the washcloths were removed, sterile 4x4 gauze was lightly rubbed over the area resulting in the removal of most of the dried blood. Several staples that could not be seen were revealed and the stitches are now mostly identifiable. Patient tolerated this well.

## 2015-12-31 NOTE — Progress Notes (Signed)
RT placed patient on CPAP of 9. Patient tolerating well. No O2 bleed in needed.
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
## 2016-01-01 ENCOUNTER — Inpatient Hospital Stay (HOSPITAL_COMMUNITY): Payer: Medicare Other | Admitting: Physical Therapy

## 2016-01-01 ENCOUNTER — Inpatient Hospital Stay (HOSPITAL_COMMUNITY): Payer: Medicare Other | Admitting: Speech Pathology

## 2016-01-01 ENCOUNTER — Inpatient Hospital Stay (HOSPITAL_COMMUNITY): Payer: Medicare Other | Admitting: *Deleted
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
## 2016-01-01 NOTE — Progress Notes (Signed)
Physical Therapy Session Note  Patient Details  Name: Sara [name] MRN: [id number] Date of Birth: 1981-04-20  Today's Date: 01/01/2016 PT Individual Time: 1130-1200 and 1300-1400 PT Individual Time Calculation (min): 30 min and 60 min  Short Term Goals: Week 3:  PT Short Term Goal 1 (Week 3): Patient will perform bed mobility with mod A and min cues for sequencing and technique.  PT Short Term Goal 2 (Week 3): Patient will consistently transfer bed <> wheelchair with mod A.  PT Short Term Goal 3 (Week 3): Patient will ambulate x 50 ft with max A using LRAD and assist to place RLE 50% of time. PT Short Term Goal 4 (Week 3): Patient will perform sit <> stand with consistent min A.  PT Short Term Goal 5 (Week 3): Patient will maintain dynamic standing balance x 5 min with min A.    Skilled Therapeutic Interventions/Progress Updates:   Treatment 1: Patient propelled wheelchair to/from gym using L hemi technique with mod I in controlled environment. Performed squat pivot transfer wheelchair > NuStep with mod A. For RLE NMR and forced use, performed NuStep using BLE only at level 3 x 12 min. Gait training using L hemiwalker x 55 ft with max A to advance/place RLE due to increased tone this date limiting patient's ability to initiate hip flexion including two turns and max cues to sequence turning to back up to wheelchair. Patient left sitting in wheelchair with lunch meal setup and call bell in reach.   Treatment 2: Focus on RLE/RUE neuro re-ed, forced use, and weightbearing with brief tall kneeling progressed to quadruped progressed to repetitive taps to target using LUE to facilitate increased RUE WB in quadruped, R sidelying propped on elbow with therapist facilitating R shoulder joint positioning/approximation progressed to reaching with LUE in R sidelying to facilitate lateral trunk shortening/elongation, supine bridging with hip adductor ball squeeze to facilitate co-contraction to fatigue,  supine lower lumbar rotation in hooklying to R and L, PROM RLE figure 4 stretch, hamstrings, heel cord, and hip adductors, reciprocal scooting with manual facilitation for RLE/trunk and max multimodal cues for sequencing and technique with BLE supported progressed to BLE unsupported, seated activity with focus on R pelvic protraction pushing into therapist's hands, and seated kinetron from wheelchair level using BLE only at 30 cm/sec x 4 min. Patient tolerated well and left sitting in wheelchair with needs in reach.   Therapy Documentation Precautions:  Precautions Precautions: Fall Precaution Comments: R lean, L ankle instability Required Braces or Orthoses: Other Brace/Splint (hand splint and PRAFO at night) Restrictions Weight Bearing Restrictions: No Pain: Pain Assessment Pain Assessment: No/denies pain Pain Score: 0-No pain   See Function Navigator for Current Functional Status.   Therapy/Group: Individual Therapy  Kerney ElbeVarner, Jolon Degante A 01/01/2016, 12:20 PM

## 2016-01-01 NOTE — Progress Notes (Signed)
Ortho splint placed on right hand with no difficulty. Met slightly more resistance when attempting to straighten fingers than last week. Patient educated on splint importance and encouraged to discuss further with OT. Brace to RLE also placed. Patient c/o continued to pain to RLE, below knee. No edema, redness or warmth noted. Patient describes pain as cramping-like in nature. Will continue to monitor.

## 2016-01-01 NOTE — Progress Notes (Signed)
RT placed patient on CPAP of 9. Patient tolerating well. No O2 bleed in needed. 

## 2016-01-01 NOTE — Progress Notes (Signed)
Speech Language Pathology Daily Session Note  Patient Details  Name: Sara SchaumannDebra C Beaulac MRN: [id number] Date of Birth: 01-12-1981  Today's Date: 01/01/2016 SLP Individual Time: 1000-1100 SLP Individual Time Calculation (min): 60 min  Short Term Goals: Week 3: SLP Short Term Goal 1 (Week 3): Pt will navigate the use the computer/internet with min A.  SLP Short Term Goal 2 (Week 3): Patient will demonstrate complex reasoning and problem solving for novel tasks with mod A.  SLP Short Term Goal 3 (Week 3): Patient will self-monitor and correct errors during complex tasks with mod A.  SLP Short Term Goal 4 (Week 3): Patient will attend to right field of environment with complex visual tasks at mod I.   Skilled Therapeutic Interventions: Skilled treatment session focused on cognitive goals. SLP facilitated session by providing supervision verbal cues for comprehension to procedures for a complex, novel task in which she completed with extra time. SLP also facilitated session by re-administering the MoCA (version 7.2) in which the patient scored 26/30 points with a score of 26 or above considered normal. Patient continues to demonstrate impairments in the areas of organization with complex information. Patient propelled herself to her room with Mod I. Patient left upright in wheelchair with all needs within reach. Continue with current plan of care.    Function:  Cognition Comprehension Comprehension assist level: Understands complex 90% of the time/cues 10% of the time  Expression   Expression assist level: Expresses complex ideas: With extra time/assistive device  Social Interaction Social Interaction assist level: Interacts appropriately with others with medication or extra time (anti-anxiety, antidepressant).  Problem Solving Problem solving assist level: Solves complex 90% of the time/cues < 10% of the time  Memory Memory assist level: Recognizes or recalls 90% of the time/requires cueing < 10%  of the time    Pain No/Denies Pain   Therapy/Group: Individual Therapy  Ajahni Nay 01/01/2016, 4:29 PM
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
## 2016-01-01 NOTE — Progress Notes (Signed)
Occupational Therapy Session Note  Patient Details  Name: Dennard SchaumannDebra C Chancellor MRN: [id number] Date of Birth: 1981/01/20  Today's Date: 01/01/2016 OT Individual Time: 0900-1000 OT Individual Time Calculation (min): 60 min    Short Term Goals: Week 3:  OT Short Term Goal 1 (Week 3): Pt will don pants with mod A OT Short Term Goal 2 (Week 3): Pt will perform toileting tasks with max A OT Short Term Goal 3 (Week 3): Pt will complete bathing tasks with mod A sit<>stand from w/c at sink OT Short Term Goal 4 (Week 3): Pt will perform BSC transfers with mod A  Skilled Therapeutic Interventions/Progress Updates:    Pt engaged in BADL retraining including bathing and dressing with sit<>stand from w/c at sink.  Pt declined shower this morning but agreeable to shower tomorrow morning.  Pt elected to wear a "standard bra" with a fastener in back this morning and required assistance with fastening her bra.  Pt continues to demonstrate increase use of compensatory strategies/techniques to increase independence with bathing/dressing tasks.  Pt performs sit>stand with mod A and requires mod A for standing balance to pull up pants.  Pt propelled w/c to ADL apartment to discuss DME options for bathing at home and bathroom layout at home.  Focus on activity tolerance, sit<>stand, standing balance, BADL retraining, sitting balance, and safety awareness to increase independence with BADLs.   Therapy Documentation Precautions:  Precautions Precautions: Fall Precaution Comments: R lean, L ankle instability Required Braces or Orthoses: Other Brace/Splint (hand splint and PRAFO at night) Restrictions Weight Bearing Restrictions: No Pain: Pain Assessment Pain Assessment: No/denies pain Pain Score: 0-No pain ADL: ADL ADL Comments: refer to functional navigator  See Function Navigator for Current Functional Status.   Therapy/Group: Individual Therapy  [name], [place] Klem Chappell 01/01/2016, 11:00 AM
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
## 2016-01-01 NOTE — Progress Notes (Signed)
Physical Therapy Session Note  Patient Details  Name: Sara [name] MRN: [id number] Date of Birth: January 23, 1981  Today's Date: 01/01/2016 PT Individual Time: 0800-0900 PT Individual Time Calculation (min): 60 min   Short Term Goals: Week 3:  PT Short Term Goal 1 (Week 3): Patient will perform bed mobility with mod A and min cues for sequencing and technique.  PT Short Term Goal 2 (Week 3): Patient will consistently transfer bed <> wheelchair with mod A.  PT Short Term Goal 3 (Week 3): Patient will ambulate x 50 ft with max A using LRAD and assist to place RLE 50% of time. PT Short Term Goal 4 (Week 3): Patient will perform sit <> stand with consistent min A.  PT Short Term Goal 5 (Week 3): Patient will maintain dynamic standing balance x 5 min with min A.   Skilled Therapeutic Interventions/Progress Updates:    Patient received sitting in WC finishing breakfast. PT assisted patient in donning pants with max A to don R LE and min A to don LLE. Min-Mod A for sit<>stand to pull pants up. WC mobility to rehab gym at supervision Level and min cues for doorway management. WC mobility also performed at mod I level for 176f in controlled environment. Patient utilized hemi technique for all WC mobility.    blocked sit<>stand training with min A from PT x 5: 29 seconds for first trial.  Second trial and mod A to prevent lateral LOB  due to increased adduction tone in the LLE. Constant cues for improve hip extension in standing and increased weight shift over RLE to prevent LOB.   NMR: standing balance with anterior reach to grab 8 cards off velcro board x 2 with mod A to prevent lateral LOB. PT provided mod multimodal cues for improved weight shift to L and increased bilateral hip activation to improve hip stability.   Gait training for 180fx 2 with hemi wakler and max A from PT for R LE advancement. Patient noted to have minimal RLE hip flexion to prepare for foot advancement, but unable to clear  floor without Assist from PT. Min Cues also provided for proper sequencing of Hemi walker with turns to prepare to stand to sit.   Patient left sitting in WC at end of PT session with call bell within reach and all needs met.        Therapy Documentation Precautions:  Precautions Precautions: Fall Precaution Comments: R lean, L ankle instability Required Braces or Orthoses: Other Brace/Splint (hand splint and PRAFO at night) Restrictions Weight Bearing Restrictions: No General:   Vital Signs: Therapy Vitals Temp: 98.1 F (36.7 C) Temp Source: Oral Pulse Rate: 71 Resp: 18 BP: 115/67 mmHg Patient Position (if appropriate): Lying Oxygen Therapy SpO2: 98 % O2 Device: Not Delivered Pain: Pain Assessment Pain Assessment: No/denies pain Pain Score: 0-No pain   See Function Navigator for Current Functional Status.   Therapy/Group: Individual Therapy  AuLorie Phenix/24/2017, 10:01 AM

## 2016-01-02 ENCOUNTER — Inpatient Hospital Stay (HOSPITAL_COMMUNITY): Payer: Medicare Other | Admitting: Speech Pathology

## 2016-01-02 ENCOUNTER — Inpatient Hospital Stay (HOSPITAL_COMMUNITY): Payer: Medicare Other | Admitting: Physical Therapy

## 2016-01-02 ENCOUNTER — Inpatient Hospital Stay (HOSPITAL_COMMUNITY): Payer: Medicare Other

## 2016-01-02 ENCOUNTER — Encounter (HOSPITAL_COMMUNITY): Payer: Self-pay

## 2016-01-02 MED ORDER — AMANTADINE HCL 100 MG PO CAPS
100.0000 mg | ORAL_CAPSULE | Freq: Every day | ORAL | Status: DC
  Administered 2016-01-03 – 2016-01-10 (×8): 100 mg via ORAL
  Filled 2016-01-02 (×8): qty 1

## 2016-01-02 NOTE — Progress Notes (Signed)
Patient noted to have ortho splint removed from right hand. Patient stated she "took it off around 2 am." Warm soak applied to scabbed area of incision on scalp. Patient c/o continued pain to RLE, below knee and to toes, but states "it's not as bad." Encouraged to alert MD on rounds.

## 2016-01-02 NOTE — Progress Notes (Signed)
Speech Language Pathology Daily Session Note  Patient Details  Name: Sara [name] MRN: [id number] Date of Birth: 1981-03-23  Today's Date: 01/02/2016 SLP Individual Time: 1400-1500 SLP Individual Time Calculation (min): 60 min  Short Term Goals: Week 3: SLP Short Term Goal 1 (Week 3): Pt will navigate the use the computer/internet with min A.  SLP Short Term Goal 2 (Week 3): Patient will demonstrate complex reasoning and problem solving for novel tasks with mod A.  SLP Short Term Goal 3 (Week 3): Patient will self-monitor and correct errors during complex tasks with mod A.  SLP Short Term Goal 4 (Week 3): Patient will attend to right field of environment with complex visual tasks at mod I.   Skilled Therapeutic Interventions: Skilled treatment session focused on cognition goals. SLP facilitated the session by allowing the pt extra time to navigate the use of the computer to find directions for "UNO" card game. Extra time may have been needed secondary to pt is limited to using non-dominate left hand to type. Pt able to recall novel directions to game and completed complex reasoning and problem solving for novel game with Min A to supervision cues. Pt required Min A verbal cues to self-monitor for errors. On one occasion, pt didn't see cards to her left and needed a verbal reminder to located cards. Pt able to propel herself back to room with supervision. Patient left upright in wheelchair with all needs within reach. Continue current plan of care.   Function:    Cognition Comprehension Comprehension assist level: Understands complex 90% of the time/cues 10% of the time  Expression   Expression assist level: Expresses complex ideas: With extra time/assistive device  Social Interaction Social Interaction assist level: Interacts appropriately with others with medication or extra time (anti-anxiety, antidepressant).  Problem Solving Problem solving assist level: Solves complex 90% of the  time/cues < 10% of the time  Memory Memory assist level: Recognizes or recalls 90% of the time/requires cueing < 10% of the time    Pain Pain Assessment Pain Assessment: No/denies pain  Therapy/Group: Individual Therapy  Gladie Gravette 01/02/2016, 3:06 PM

## 2016-01-02 NOTE — Progress Notes (Signed)
Occupational Therapy Session Note  Patient Details  Name: Sara SchaumannDebra C [name] MRN: [id number] Date of Birth: 1981/05/07  Today's Date: 01/02/2016 OT Individual Time: 1000-1100 OT Individual Time Calculation (min): 60 min    Short Term Goals: Week 3:  OT Short Term Goal 1 (Week 3): Pt will don pants with mod A OT Short Term Goal 2 (Week 3): Pt will perform toileting tasks with max A OT Short Term Goal 3 (Week 3): Pt will complete bathing tasks with mod A sit<>stand from w/c at sink OT Short Term Goal 4 (Week 3): Pt will perform BSC transfers with mod A  Skilled Therapeutic Interventions/Progress Updates:    Pt resting in w/c upon arrival with sister present.  Pt engaged in bathing at shower level and dressing with sit<>stand from w/c at sink.  Pt's sister present for active participation and education.  Pt's sister provided proper assistance (with instructions/directions) throughout session.  Pt's sister assisted pt with sit<>stand at sink for LB clothing assistance.  Focus on BADL retraining, sit<>stand, standing balance, discharge planning, and education in preparation for discharge next week.   Therapy Documentation Precautions:  Precautions Precautions: Fall Precaution Comments: R lean, L ankle instability Required Braces or Orthoses: Other Brace/Splint (hand splint and PRAFO at night) Restrictions Weight Bearing Restrictions: No  Pain:  Pt c/o "stretching pain" in R biceps; massage and repositioning ADL: ADL ADL Comments: refer to functional navigator  See Function Navigator for Current Functional Status.   Therapy/Group: Individual Therapy  [name], Angelgabriel Willmore Chappell 01/02/2016, 11:07 AM

## 2016-01-02 NOTE — Progress Notes (Signed)
Occupational Therapy Note  Patient Details  Name: Dennard SchaumannDebra C Chaudhari MRN: [id number] Date of Birth: 06-03-1981  Today's Date: 01/02/2016 OT Individual Time: 1300-1400 OT Individual Time Calculation (min): 60 min   Pt denied pain Individual Therapy  Pt initially engaged in practicing w/c<>drop arm BSC transfers with max A X 4. Pt continues to required mod verbal cues for sequencing.  Discussed importance of correct technique during transfer to reduce risk of injury.  Pt verbalized understanding. Pt also practiced w/c<>tub bench transfers X 4 with max A.  Pt transitioned to sit<>stand tasks and lateral scooting.  Pt remained in w/c with all needs within reach.      [name], Salwa Bai [facility] 01/02/2016, 3:04 PM

## 2016-01-02 NOTE — Progress Notes (Signed)
Social Work [name], female   DOB: April 30, 1981, 35 y.o.   MRN: [id number]   Met with pt and sister this morning to review team conference.  Sister attending tx sessions to begin education. Both aware that team continues to target 6/2 for d/c and confirm plan is for pt to d/c to father's home.  All family to share in providing 24/7 assistance.  Pt remains very motivated and affect bright.  Reports good session with neuropsychology this morning.  Continue to follow.  Sara Jonsson, LCSW
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
## 2016-01-02 NOTE — Progress Notes (Signed)
Physical Therapy Weekly Progress Note  Patient Details  Name: Sara [name] MRN: [id number] Date of Birth: 11/24/1980  Beginning of progress report period: Dec 26, 2015 End of progress report period: Jan 02, 2016  Today's Date: 01/02/2016 PT Individual Time: 1100-1200 and 1545-1610 PT Individual Time Calculation (min): 60 min and 25 min  Patient has met 3 of 5 short term goals. Patient continues to make steady progress towards goals but still requires cuing for sequencing and technique for transfers and bed mobility. Initiated family education with patient's sister and father, will continue training in preparation for discharge to father's home next week. Per sister, ramp is almost complete for home access. Family educated on recommendation for ambulation with therapy only at this time due to continued increased R extensor and adductor tone requiring max A for household distance gait, all verbalized understanding.   Patient continues to demonstrate the following deficits: R hemiplegia, weakness, decreased endurance, impaired timing and sequencing, abnormal tone, unbalanced muscle activation, decreased coordination, decreased midline orientation, decreased attention to R, decreased attention, decreased awareness, decreased problem solving, decreased safety awareness, decreased memory, delayed processing, decreased sitting balance, decreased standing balance, decreased postural control, and decreased balance strategies and therefore will continue to benefit from skilled PT intervention to enhance overall performance with activity tolerance, balance, postural control, ability to compensate for deficits, functional use of  right upper extremity and right lower extremity and coordination.  Patient not progressing toward long term goals.  See goal revision.  Plan of care revisions: car transfer and ambulation goals downgraded to max A.  PT Short Term Goals Week 3:  PT Short Term Goal 1 (Week 3):  Patient will perform bed mobility with mod A and min cues for sequencing and technique.  PT Short Term Goal 1 - Progress (Week 3): Met PT Short Term Goal 2 (Week 3): Patient will consistently transfer bed <> wheelchair with mod A.  PT Short Term Goal 2 - Progress (Week 3): Met PT Short Term Goal 3 (Week 3): Patient will ambulate x 50 ft with max A using LRAD and assist to place RLE 50% of time. PT Short Term Goal 3 - Progress (Week 3): Progressing toward goal PT Short Term Goal 4 (Week 3): Patient will perform sit <> stand with consistent min A.  PT Short Term Goal 4 - Progress (Week 3): Progressing toward goal PT Short Term Goal 5 (Week 3): Patient will maintain dynamic standing balance x 5 min with min A.  PT Short Term Goal 5 - Progress (Week 3): Met Week 4:  PT Short Term Goal 1 (Week 4): = LTGs due to anticipated LOS  Skilled Therapeutic Interventions/Progress Updates:   Treatment 1: Session focused on initiation of hands-on family training with patient and sister Sara [name] for squat pivot transfers wheelchair <> mat table and wheelchair <> regular bed in ADL apartment with focus on head hips relationship and anterior translation over BOS to facilitate pivot, bed mobility to L on regular bed in ADL apartment with min A to bring RLE on/off bed and verbal cues for sequencing and technique, and car transfer to seat height simulating patient's father's truck (average height) via stand pivot x 2 with initial demonstration by therapist followed by sister providing safe and appropriate return demonstration with cuing. Patient self-directs care and wheelchair setup for mobility appropriately and propelled wheelchair on rehab unit via L hemi technique with mod I. Patient left in room with sister and RN present.   Treatment 2: Focus  on community wheelchair propulsion via L hemi technique throughout hospital and outdoors on brick and concrete with supervision except min A to clear elevator threshold,  functional transfers wheelchair <> outdoor bench with mod A, and stair negotiation outdoors up/down 4 concrete stairs using L rail with max A to advance/place RLE. Patient left sitting in wheelchair with needs in reach.   Therapy Documentation Precautions:  Precautions Precautions: Fall Precaution Comments: R lean, L ankle instability Required Braces or Orthoses: Other Brace/Splint (hand splint and PRAFO at night) Restrictions Weight Bearing Restrictions: No Pain: Pain Assessment Pain Assessment: No/denies pain   See Function Navigator for Current Functional Status.  Therapy/Group: Individual Therapy  Laretta Alstrom 01/02/2016, 12:15 PM
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
## 2016-01-03 ENCOUNTER — Inpatient Hospital Stay (HOSPITAL_COMMUNITY): Payer: Medicare Other | Admitting: Speech Pathology

## 2016-01-03 ENCOUNTER — Inpatient Hospital Stay (HOSPITAL_COMMUNITY): Payer: Medicare Other | Admitting: Occupational Therapy

## 2016-01-03 ENCOUNTER — Inpatient Hospital Stay (HOSPITAL_COMMUNITY): Payer: Medicare Other | Admitting: Physical Therapy

## 2016-01-03 NOTE — Progress Notes (Signed)
Speech Language Pathology Weekly Progress and Session Note  Patient Details  Name: Sara [name] MRN: [id number] Date of Birth: 07-30-1981  Beginning of progress report period: Dec 27, 2015 End of progress report period: Jan 03, 2016  Today's Date: 01/03/2016 SLP Individual Time: 1255-1355 SLP Individual Time Calculation (min): 60 min  Short Term Goals: Week 3: SLP Short Term Goal 1 (Week 3): Pt will navigate the use the computer/internet with min A.  SLP Short Term Goal 1 - Progress (Week 3): Met SLP Short Term Goal 2 (Week 3): Patient will demonstrate complex reasoning and problem solving for novel tasks with mod A.  SLP Short Term Goal 2 - Progress (Week 3): Met SLP Short Term Goal 3 (Week 3): Patient will self-monitor and correct errors during complex tasks with mod A.  SLP Short Term Goal 3 - Progress (Week 3): Met SLP Short Term Goal 4 (Week 3): Patient will attend to right field of environment with complex visual tasks at mod I.  SLP Short Term Goal 4 - Progress (Week 3): Met    New Short Term Goals: Week 4: SLP Short Term Goal 1 (Week 4): Pt will navigate/use the computer/internet with supervision verbal cues.  SLP Short Term Goal 2 (Week 4): Patient will demonstrate complex reasoning and problem solving for novel tasks with supervision verbal cues.  SLP Short Term Goal 3 (Week 4): Patient will self-monitor and correct errors during complex tasks with min A verbal cues.    Weekly Progress Updates: Patient has made excellent gains and has met 4 of 4 STGs this reporting period due to improved cognitive functioning. Currently, patient is demonstrating behaviors consistent with a Rancho Level VIII and requires overall supervision-Min A verbal and question cues to complete mildly complex tasks in regards to problem solving, recall and emergent awareness of errors. Patient and family education is ongoing. Patient would benefit from continued skilled SLP intervention to maximize her  cognitive function and overall functional independence prior to discharge home.     Intensity: Minumum of 1-2 x/day, 30 to 90 minutes Frequency: 3 to 5 out of 7 days Duration/Length of Stay: 6/2 Treatment/Interventions: Cognitive remediation/compensation;Functional tasks;Therapeutic Activities;Cueing hierarchy;Therapeutic Exercise;Patient/family education;Environmental controls;Internal/external aids   Daily Session  Skilled Therapeutic Interventions: Skilled treatment session focused on cognitive goals. SLP facilitated session by providing extra time and supervision-Min A verbal and question cues for problem solving and organization during a complex, novel task. SLP also facilitated session by providing Min A verbal cues along with extra time for computer navigation task. Patient recalled events from previous therapy sessions with Mod I. Patient propelled herself back to her room with Mod I and left with family and friends present. Continue with current plan of care.      Function:   Cognition Comprehension Comprehension assist level: Understands complex 90% of the time/cues 10% of the time  Expression Expression assistive device: Communication board Expression assist level: Expresses complex ideas: With extra time/assistive device  Social Interaction Social Interaction assist level: Interacts appropriately with others with medication or extra time (anti-anxiety, antidepressant).  Problem Solving Problem solving assist level: Solves basic problems with no assist  Memory Memory assist level: Recognizes or recalls 90% of the time/requires cueing < 10% of the time   Pain No/Denies Pain  Therapy/Group: Individual Therapy  Liberti Appleton, [place] 01/03/2016, 4:10 PM

## 2016-01-03 NOTE — Progress Notes (Signed)
Patient noted to have CPAP off. Refused assistance with placing it back on.

## 2016-01-03 NOTE — Progress Notes (Signed)
Speech Language Pathology Daily Session Note  Patient Details  Name: Sara SchaumannDebra C Roma MRN: [id number] Date of Birth: 03-15-1981  Today's Date: 01/03/2016 SLP Individual Time: 0800-0900 SLP Individual Time Calculation (min): 60 min  Short Term Goals: Week 4: SLP Short Term Goal 1 (Week 4): Pt will navigate/use the computer/internet with supervision verbal cues.  SLP Short Term Goal 2 (Week 4): Patient will demonstrate complex reasoning and problem solving for novel tasks with supervision verbal cues.  SLP Short Term Goal 3 (Week 4): Patient will self-monitor and correct errors during complex tasks with min A verbal cues.    Skilled Therapeutic Interventions: Pt seen for cognitive-linguistic therapy with focus on reasoning, functional computer use and mathematic function. Moderate level word problems required mod A to solve. Improvements continue compared with previous sessions re: reasoning and mathematic function. Able to use calculator at mod I for increased time. Min A for functional internet navigation- finding pertinent information.   Function:  Eating Eating                 Cognition Comprehension Comprehension assist level: Understands complex 90% of the time/cues 10% of the time  Expression   Expression assist level: Expresses complex ideas: With extra time/assistive device  Social Interaction Social Interaction assist level: Interacts appropriately with others with medication or extra time (anti-anxiety, antidepressant).  Problem Solving Problem solving assist level: Solves basic problems with no assist  Memory Memory assist level: Recognizes or recalls 90% of the time/requires cueing < 10% of the time    Pain Pain Assessment Pain Assessment: No/denies pain  Therapy/Group: Individual Therapy  Rocky CraftsKara E Myliah Medel MA, CCC-SLP 01/03/2016, 11:26 AM

## 2016-01-03 NOTE — Progress Notes (Signed)
Occupational Therapy Weekly Progress Note  Patient Details  Name: Sara [name] MRN: [id number] Date of Birth: [birth year]  Beginning of progress report period: Dec 27, 2015 End of progress report period: Jan 03, 2016  Today's Date: 01/03/2016 OT Individual Time: 1100-1200 OT Individual Time Calculation (min): 60 min    Patient has met 3 of 4 short term goals.  Pt is making excellent progress with her cognition (awareness, speed of processing, problem solving), perception, decreasing tone in RUE, adaptive strategies for ADLs. Pt is currently functioning at a [place].  Patient continues to demonstrate the following deficits:  R hemiplegia with increased tone in fingers, limited postural control and balance in standing and therefore will continue to benefit from skilled OT intervention to enhance overall performance with BADL.  Patient not progressing toward all of her long term goals.  See goal revision..  Plan of care revisions: LB dressing and tub transfers downgraded to mod A. Toileting downgraded to max A..  Otherwise, pt is on track to reach min A level with bathing, toilet transfers, UB dressing, standing balance.  OT Short Term Goals Week 3:  OT Short Term Goal 1 (Week 3): Pt will don pants with mod A OT Short Term Goal 1 - Progress (Week 3): Met OT Short Term Goal 2 (Week 3): Pt will perform toileting tasks with max A OT Short Term Goal 2 - Progress (Week 3): Progressing toward goal OT Short Term Goal 3 (Week 3): Pt will complete bathing tasks with mod A sit<>stand from w/c at sink OT Short Term Goal 3 - Progress (Week 3): Met OT Short Term Goal 4 (Week 3): Pt will perform BSC transfers with mod A OT Short Term Goal 4 - Progress (Week 3): Met Week 4:  OT Short Term Goal 1 (Week 4): STGs = LTGs  Skilled Therapeutic Interventions/Progress Updates:    Pt seen this session to address balance and RUE. Pt declined bathing as she was already dressed for the day. Pt propelled self  to the gym and worked on squat pivot transfer to mat with guiding A to maintain forward wt shift throughout the transfer.  Pt worked on static standing holding balance without UE support for up to 30 seconds at a time for 10-12 reps.  Gradual wt shifts to L with guiding support. As pt shifts to far to R she has to rely on L UE to correct balance. Worked on trying to wt shift with hips/legs only but very difficult for her at this time.  Sit to partial squats.  estim to R finger / wrist ext at 30 intensity, 35 pps, 10 sec on and 10 off with assist to fully extend fingers.  Pt had hand placed around cone in relax phase and used contract phase to visualize releasing the cone. Post estim, pt worked on B hand holds with reaching and rotation of trunk.  Pt transferred back to w/c and self propelled back to room. Assisted pt with setting up her lunch tray including cutting chicken. Pt with all needs met. Call light in reach.  Therapy Documentation Precautions:  Precautions Precautions: Fall Precaution Comments: R lean, L ankle instability Required Braces or Orthoses: Other Brace/Splint (hand splint and PRAFO at night) Restrictions Weight Bearing Restrictions: No  Pain: Pain Assessment Pain Assessment: No/denies pain ADL: ADL ADL Comments: refer to functional navigator  See Function Navigator for Current Functional Status.   Therapy/Group: Individual Therapy  Hermann 01/03/2016, 12:36 PM

## 2016-01-03 NOTE — Progress Notes (Signed)
Physical Therapy Session Note  Patient Details  Name: Sara SchaumannDebra C [name] MRN: [id number] Date of Birth: Feb 04, 1981  Today's Date: 01/03/2016 PT Individual Time: 1000-1100 and 1500-1530 PT Individual Time Calculation (min): 60 min and 30 min  Short Term Goals: Week 4:  PT Short Term Goal 1 (Week 4): = LTGs due to anticipated LOS  Skilled Therapeutic Interventions/Progress Updates:   Treatment 1: Patient in wheelchair, donned socks and shoes total A and patient propelled to/from gym via L hemi technique with mod I in controlled environment. Focus on squat pivot transfer training blocked practice wheelchair <> mat table to R and L with wheelchair set up from both directions, patient required no cues to recall sequencing and technique but required min cues to translate weight anterior over BOS when transferring wheelchair > mat to L and mat > wheelchair to R with min A and assist for R foot placement and progressing from min A to supervision with transfers from wheelchair > mat to R and mat > wheelchair to L. Gait training using L hemiwalker 2 x 50 ft with max A and max multimodal cues for increased L step length to facilitate reciprocal gait pattern and weight shift to L in order to advance RLE, total A to advance/place RLE due to increased adductor/extensor tone. Patient demonstrated improved sequencing with turns this date. In day room, patient instructed in seated gaze stabilization exercises with VOR x 1 viewing holding target at arm's length, binocular and monocular with R/L eye patched for horizontal head turns 30 sec each with no c/o dizziness progressed to vertical head nods with increased difficulty keeping gaze fixated on target. Will continue to practice. Patient left in wheelchair for OT session.  Therapy Documentation Treatment 2: Patient in wheelchair with mom and friend present. Patient requesting to toilet, performed squat pivot transfer to toilet using grab bar with min guard, assist for  clothing management with patient performing hygiene in standing, and stand pivot back to wheelchair with min A and assist for RLE placement. Performed hand hygiene from wheelchair. In gym, initiated hands-on family training with patient and mother for squat pivot transfers with min A to R and L and in room for sit > supine with min A to lift RLE onto bed with initial demonstration with therapist progressed to patient's mother assisting patient. Discussed foot placement, head hips relationship, sequencing, and wheelchair setup. Patient and mother verbalized understanding, left semi reclined in bed with all needs in reach.    Precautions:  Precautions Precautions: Fall Precaution Comments: R lean, L ankle instability Required Braces or Orthoses: Other Brace/Splint (hand splint and PRAFO at night) Restrictions Weight Bearing Restrictions: No Pain: Pain Assessment Pain Assessment: No/denies pain  See Function Navigator for Current Functional Status.   Therapy/Group: Individual Therapy  Kerney ElbeVarner, [name] 01/03/2016, 10:51 AM

## 2016-01-03 NOTE — Progress Notes (Signed)
Patient c/o "tightness" and "discomfort" in RLE, below the knee. This has been a continued complaint. RLE placed in ortho brace, as well as right hand in ortho splint. Patient previously given tylenol for generalized discomfort. Denies need for anything stronger. Will continue to monitor.
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
## 2016-01-03 NOTE — Progress Notes (Signed)
Patient rested well throughout the night. One episode of urinary incontinence while sleeping. Able to tolerate ortho splint to right hand. Brace to RLE also in place. Patient continues to c/o discomfort to right leg, below the knee. Palpable muscle spasms noted. No acute distress.

## 2016-01-04 ENCOUNTER — Inpatient Hospital Stay (HOSPITAL_COMMUNITY): Payer: Medicare Other | Admitting: Physical Therapy

## 2016-01-04 ENCOUNTER — Inpatient Hospital Stay (HOSPITAL_COMMUNITY): Payer: Medicare Other | Admitting: Occupational Therapy

## 2016-01-04 NOTE — Progress Notes (Signed)
Physical Therapy Session Note  Patient Details  Name: Sara [name] MRN: [id number] Date of Birth: 02-11-1981  Today's Date: 01/04/2016 PT Individual Time: 1000-1030 PT Individual Time Calculation (min): 30 min   Short Term Goals: Week 1:  PT Short Term Goal 1 (Week 1): Patient will perform bed <> wheelchair transfers with mod A x 2.  PT Short Term Goal 1 - Progress (Week 1): Met PT Short Term Goal 2 (Week 1): Patient will maintain dynamic standing balance x 1 min with max A.  PT Short Term Goal 2 - Progress (Week 1): Met PT Short Term Goal 3 (Week 1): Patient will perform bed mobility with max A and mod verbal cues for technique.  PT Short Term Goal 3 - Progress (Week 1): Met PT Short Term Goal 4 (Week 1): Patient will perform pre-gait activities with assist of 1 person.  PT Short Term Goal 4 - Progress (Week 1): Met PT Short Term Goal 5 (Week 1): Patient will maintain dynamic sitting balance x 5 min with supervision.  PT Short Term Goal 5 - Progress (Week 1): Met  Skilled Therapeutic Interventions/Progress Updates:  Pt was seen bedside in the am. Pt propelled w/c about 125 feet with L UE and LE and no assist with increased time. Pt performed multiple sit to stand transfers with armrest and min A with verbal cues. Pt ambulated 50' and 25' x 2 with hemiwalker and mod to max A . Utilized L heel lift and R PRAFO during ambulation with + results. Pt still required assist to advance R LE. Pt propelled w/c back to room about 125 feet with L UE and LE and no assist with increased time. Pt may benefit from AFO on R LE and shoe lift on L. Pt left sitting up in room with call bell within reach.   Therapy Documentation Precautions:  Precautions Precautions: Fall Precaution Comments: R lean, L ankle instability Required Braces or Orthoses: Other Brace/Splint (hand splint and PRAFO at night) Restrictions Weight Bearing Restrictions: No General:   Pain: No c/o pain.   See Function Navigator  for Current Functional Status.   Therapy/Group: Individual Therapy  Dub Amis 01/04/2016, 12:40 PM
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
## 2016-01-04 NOTE — Progress Notes (Signed)
Occupational Therapy Session Note  Patient Details  Name: Sara [name] MRN: [id number] Date of Birth: [birth year]  Today's Date: 01/04/2016 OT Individual Time:  -    [id number]  (71 min)      Short Term Goals: Week 1:  OT Short Term Goal 1 (Week 1): Pt will complete squat pivot transfers to max A of 1. OT Short Term Goal 1 - Progress (Week 1): Met OT Short Term Goal 2 (Week 1): Pt will don shirt with min A. OT Short Term Goal 2 - Progress (Week 1): Progressing toward goal OT Short Term Goal 3 (Week 1): Pt will don pants with mod A. OT Short Term Goal 3 - Progress (Week 1): Progressing toward goal OT Short Term Goal 4 (Week 1): Pt will perform RUE self ROM with mod cues. OT Short Term Goal 4 - Progress (Week 1): Met OT Short Term Goal 5 (Week 1): Pt will sit to stand at sink with max A of 1. OT Short Term Goal 5 - Progress (Week 1): Met Week 2:  OT Short Term Goal 1 (Week 2): Pt will don shirt with min A OT Short Term Goal 2 (Week 2): Pt will don pants with mod A OT Short Term Goal 2 - Progress (Week 2): Progressing toward goal OT Short Term Goal 3 (Week 2): Pt will perform BSC transfers with max A OT Short Term Goal 3 - Progress (Week 2): Met OT Short Term Goal 4 (Week 2): Pt will perform toileting tasks with max A OT Short Term Goal 4 - Progress (Week 2): Progressing toward goal Week 3:  OT Short Term Goal 1 (Week 3): Pt will don pants with mod A OT Short Term Goal 1 - Progress (Week 3): Met OT Short Term Goal 2 (Week 3): Pt will perform toileting tasks with max A OT Short Term Goal 2 - Progress (Week 3): Progressing toward goal OT Short Term Goal 3 (Week 3): Pt will complete bathing tasks with mod A sit<>stand from w/c at sink OT Short Term Goal 3 - Progress (Week 3): Met OT Short Term Goal 4 (Week 3): Pt will perform BSC transfers with mod A OT Short Term Goal 4 - Progress (Week 3): Met  Skilled Therapeutic Interventions/Progress Updates:    Pt engaged in Bowles, sit to stand,  standing balance.  Pt propelled wc to gym.    She  Engaged in sit to stand and standing balance with min assist and cues for engaging quads for right leg.  Used HI LO mat to practiced Homer through RUE in standing and sitting. Marland Kitchen  Pt practiced RUE neuro stretching with some tingling in biceps.  Pt reported her splint was put on wrong lst evening and her hand is very tight today.  Went over donning splint and color coordinated strapping.  Pt verbalized understanding to educate staff if they put it on incorrectly.    Right hand has increased tone at PIP joint in each finger.  Instructed pt on stretching exercises to do when not in therapy.   Therapy Documentation Precautions:  Precautions Precautions: Fall Precaution Comments: R lean, L ankle instability Required Braces or Orthoses: Other Brace/Splint (hand splint and PRAFO at night) Restrictions Weight Bearing Restrictions: No   Pain:  No tingling in her right UE since last evening   ADL: ADL ADL Comments: refer to functional navigator     See Function Navigator for Current Functional Status.   Therapy/Group: Individual Therapy  Lisa Roca  01/04/2016, 3:49 PM

## 2016-01-05 ENCOUNTER — Inpatient Hospital Stay (HOSPITAL_COMMUNITY): Payer: Medicare Other | Admitting: Physical Therapy

## 2016-01-05 ENCOUNTER — Inpatient Hospital Stay (HOSPITAL_COMMUNITY): Payer: Medicare Other | Admitting: Occupational Therapy
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
## 2016-01-05 NOTE — Progress Notes (Signed)
Occupational Therapy Session Note  Patient Details  Name: Sara [name] MRN: [id number] Date of Birth: 1980/12/14  Today's Date: 01/05/2016 OT Individual Time: 2080-2233 OT Individual Time Calculation (min): 45 min    Short Term Goals: Week 1:  OT Short Term Goal 1 (Week 1): Pt will complete squat pivot transfers to max A of 1. OT Short Term Goal 1 - Progress (Week 1): Met OT Short Term Goal 2 (Week 1): Pt will don shirt with min A. OT Short Term Goal 2 - Progress (Week 1): Progressing toward goal OT Short Term Goal 3 (Week 1): Pt will don pants with mod A. OT Short Term Goal 3 - Progress (Week 1): Progressing toward goal OT Short Term Goal 4 (Week 1): Pt will perform RUE self ROM with mod cues. OT Short Term Goal 4 - Progress (Week 1): Met OT Short Term Goal 5 (Week 1): Pt will sit to stand at sink with max A of 1. OT Short Term Goal 5 - Progress (Week 1): Met Week 2:  OT Short Term Goal 1 (Week 2): Pt will don shirt with min A OT Short Term Goal 2 (Week 2): Pt will don pants with mod A OT Short Term Goal 2 - Progress (Week 2): Progressing toward goal OT Short Term Goal 3 (Week 2): Pt will perform BSC transfers with max A OT Short Term Goal 3 - Progress (Week 2): Met OT Short Term Goal 4 (Week 2): Pt will perform toileting tasks with max A OT Short Term Goal 4 - Progress (Week 2): Progressing toward goal Week 3:  OT Short Term Goal 1 (Week 3): Pt will don pants with mod A OT Short Term Goal 1 - Progress (Week 3): Met OT Short Term Goal 2 (Week 3): Pt will perform toileting tasks with max A OT Short Term Goal 2 - Progress (Week 3): Progressing toward goal OT Short Term Goal 3 (Week 3): Pt will complete bathing tasks with mod A sit<>stand from w/c at sink OT Short Term Goal 3 - Progress (Week 3): Met OT Short Term Goal 4 (Week 3): Pt will perform BSC transfers with mod A OT Short Term Goal 4 - Progress (Week 3): Met Week 4:  OT Short Term Goal 1 (Week 4): STGs = LTGs  Skilled  Therapeutic Interventions/Progress Updates:    Skilled OT intervention with treatment focus on the following: standing balance, sit to stand, RUE NMRE.  Engaged in bathing and dressing at sink in wc.  Provided tactile and proprioceptive input to RLE/RUE in stand to get weight bearing and good alignment.  Provided HOH assist with using RUE in functional bathing.  Sit to stand and standing balance x4 with mod assist for static standing balance.  Pt completed and left in wc with all needs in reach.       Therapy Documentation Precautions:  Precautions Precautions: Fall Precaution Comments: R lean, L ankle instability Required Braces or Orthoses: Other Brace/Splint (hand splint and PRAFO at night) Restrictions Weight Bearing Restrictions: No    :  Pain:  none   ADL: ADL ADL Comments: refer to functional navigator :   :    See Function Navigator for Current Functional Status.   Therapy/Group: Individual Therapy  Lisa Roca 01/05/2016, 5:49 PM

## 2016-01-05 NOTE — Progress Notes (Signed)
Physical Therapy Session Note  Patient Details  Name: Sara [name] MRN: [id number] Date of Birth: 05-May-1981  Today's Date: 01/05/2016 PT Individual Time: 1305-1400 PT Individual Time Calculation (min): 55 min   Short Term Goals: Week 4:  PT Short Term Goal 1 (Week 4): = LTGs due to anticipated LOS  Skilled Therapeutic Interventions/Progress Updates:   Patient in bed upon arrival, father present to observe session. Transferred supine > sit with min A with cues for technique for rolling to side first instead of supine > short sit for improved positioning seated EOB. Performed squat pivot transfer to wheelchair to R with steady assist to guide hips to chair. Patient propelled wheelchair via L hemi technique with mod I. Re-assessed TUG using hemiwalker = 1 min 30 sec, improved from 4 min 38 sec at beginning of stay. Gait training using L hemiwalker x 75 ft with initial min A to progress RLE faded to max A, increased extensor/PF tone. Blocked practice sit <> stand transfer training from low couch surface with cues to push up on knees instead of arm rest to facilitate equal WB BLE and max verbal/visual cues for anterior weight shift 2 x 5 with supervision faded to min A to facilitate weight translation over BOS. Performed repetitive quick stepping to 4" step to fatigue using LLE only with LUE support on hemiwalker for forced use and neuro re-ed RLE with min A overall and verbal cues for midline orientation. Patient requesting to return to bed, squat pivot to L and sit > supine with min A to lift RLE onto bed, assist for R knee flexion/stabilize RLE to allow patient to bridge and reposition in bed. Patient left semi reclined in bed with needs in reach and father present.       Therapy Documentation Precautions:  Precautions Precautions: Fall Precaution Comments: R lean, L ankle instability Required Braces or Orthoses: Other Brace/Splint (hand splint and PRAFO at night) Restrictions Weight Bearing  Restrictions: No Pain: Pain Assessment Pain Assessment: No/denies pain   See Function Navigator for Current Functional Status.   Therapy/Group: Individual Therapy  Kerney ElbeVarner, Deryl Giroux A 01/05/2016, 1:55 PM

## 2016-01-05 NOTE — Progress Notes (Signed)
RT placed patient on CPAP of 9. No O2 bleed in needed. Patient tolerating well.

## 2016-01-06 ENCOUNTER — Inpatient Hospital Stay (HOSPITAL_COMMUNITY): Payer: Medicare Other | Admitting: Occupational Therapy

## 2016-01-06 ENCOUNTER — Inpatient Hospital Stay (HOSPITAL_COMMUNITY): Payer: Medicare Other | Admitting: Speech Pathology

## 2016-01-06 ENCOUNTER — Inpatient Hospital Stay (HOSPITAL_COMMUNITY): Payer: Medicare Other | Admitting: Physical Therapy

## 2016-01-06 ENCOUNTER — Encounter (HOSPITAL_COMMUNITY): Payer: Self-pay

## 2016-01-06 DIAGNOSIS — M62838 Other muscle spasm: Secondary | ICD-10-CM | POA: Insufficient documentation

## 2016-01-06 MED ORDER — TIZANIDINE HCL 2 MG PO TABS
2.0000 mg | ORAL_TABLET | Freq: Three times a day (TID) | ORAL | Status: DC
  Administered 2016-01-06 – 2016-01-08 (×7): 2 mg via ORAL
  Filled 2016-01-06 (×6): qty 1

## 2016-01-06 NOTE — Progress Notes (Signed)
Physical Therapy Session Note  Patient Details  Name: Sara [name] MRN: [id number] Date of Birth: 02/01/1981  Today's Date: 01/06/2016 PT Individual Time: 1300-1400 PT Individual Time Calculation (min): 60 min   Short Term Goals: Week 4:  PT Short Term Goal 1 (Week 4): = LTGs due to anticipated LOS  Skilled Therapeutic Interventions/Progress Updates:   Session focused on hands-on family training for squat pivot transfers from wheelchair and bed mobility on regular bed in ADL apartment with patient's son, father, and niece providing min [name] with cues for helper's hand placement, technique, and patient's R foot placement. Patient's mother and sister already completed transfer training and present to observe/help provide feedback and patient's brother in law present to observe only. Family training still needs completed for car transfer with patient's mother, father, and son (completed with sister) and stretching HEP. Gait training in controlled and home environments using L hemiwalker x 100 ft with max [name] to advance/place RLE due to adductor/extensor tone. Patient left in wheelchair to return to room with family.   Therapy Documentation Precautions:  Precautions Precautions: Fall Precaution Comments: R lean, L ankle instability Required Braces or Orthoses: Other Brace/Splint (hand splint and PRAFO at night) Restrictions Weight Bearing Restrictions: No Pain: Pain Assessment Pain Assessment: No/denies pain   See Function Navigator for Current Functional Status.   Therapy/Group: Individual Therapy  Kerney ElbeVarner, Sara [name] 01/06/2016, 3:10 PM

## 2016-01-06 NOTE — Progress Notes (Signed)
Speech Language Pathology Daily Session Note  Patient Details  Name: Sara [name] MRN: [id number] Date of Birth: 06/09/1981  Today's Date: 01/06/2016 SLP Individual Time: 1435-1500 SLP Individual Time Calculation (min): 25 min  Short Term Goals: Week 4: SLP Short Term Goal 1 (Week 4): Pt will navigate/use the computer/internet with supervision verbal cues.  SLP Short Term Goal 2 (Week 4): Patient will demonstrate complex reasoning and problem solving for novel tasks with supervision verbal cues.  SLP Short Term Goal 3 (Week 4): Patient will self-monitor and correct errors during complex tasks with min A verbal cues.    Skilled Therapeutic Interventions:  Pt was seen for skilled ST targeting family education.  SLP discussed current goals and progress in therapies.  SLP also facilitated the session with skilled education regarding memory compensatory strategies, including use of routine, written aids, and environmental organization to facilitate recall of complex daily information.  Handout was provided to maximize carryover in the home environment.  Pt was left in wheelchair with multiple family members present.  All questions were answered to pt's and family's satisfaction at this time.  Continue per current plan of care.      Function:  Eating Eating                 Cognition Comprehension Comprehension assist level: Follows complex conversation/direction with extra time/assistive device  Expression   Expression assist level: Expresses complex ideas: With extra time/assistive device  Social Interaction Social Interaction assist level: Interacts appropriately with others with medication or extra time (anti-anxiety, antidepressant).  Problem Solving Problem solving assist level: Solves complex 90% of the time/cues < 10% of the time  Memory Memory assist level: Recognizes or recalls 90% of the time/requires cueing < 10% of the time    Pain Pain Assessment Pain Assessment:  No/denies pain  Therapy/Group: Individual Therapy  Sara [name], Sara [name] 01/06/2016, 3:49 PM

## 2016-01-06 NOTE — Progress Notes (Signed)
Speech Language Pathology Daily Session Note  Patient Details  Name: Sara [name] MRN: [id number] Date of Birth: 09-08-1980  Today's Date: 01/06/2016   SLP Individual Time: 1100-1130 SLP Individual Time Calculation (min): 30 min    Short Term Goals: Week 4: SLP Short Term Goal 1 (Week 4): Pt will navigate/use the computer/internet with supervision verbal cues.  SLP Short Term Goal 2 (Week 4): Patient will demonstrate complex reasoning and problem solving for novel tasks with supervision verbal cues.  SLP Short Term Goal 3 (Week 4): Patient will self-monitor and correct errors during complex tasks with min A verbal cues.    Skilled Therapeutic Interventions:  Session 1: Skilled treatment session focused on cognitive goals. SLP facilitated session by providing supervision question cues for problem solving and organization during a mildly complex calendar making task with focus on strategies to utilize at home to maximize recall of numerous f/u appointments. Patient verbalized understanding and agreement with all recommendations. Patient left upright in wheelchair with all needs within reach. Continue with current plan of care.    Function:  Cognition Comprehension Comprehension assist level: Follows complex conversation/direction with extra time/assistive device  Expression   Expression assist level: Expresses complex ideas: With extra time/assistive device  Social Interaction Social Interaction assist level: Interacts appropriately with others with medication or extra time (anti-anxiety, antidepressant).  Problem Solving Problem solving assist level: Solves complex 90% of the time/cues < 10% of the time  Memory Memory assist level: Recognizes or recalls 90% of the time/requires cueing < 10% of the time    Pain Pain Assessment Pain Assessment: No/denies pain  Therapy/Group: Individual Therapy  Donnie Panik 01/06/2016, 12:36 PM
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
## 2016-01-06 NOTE — Progress Notes (Signed)
Patient refused CPAP for the night  

## 2016-01-06 NOTE — Progress Notes (Signed)
Occupational Therapy Session Note  Patient Details  Name: Sara [name] MRN: [id number] Date of Birth: [birth year]  Today's Date: 01/06/2016 OT Individual Time: 0930-1100 OT Individual Time Calculation (min): 90 min    Short Term Goals: Week 1:  OT Short Term Goal 1 (Week 1): Pt will complete squat pivot transfers to max A of 1. OT Short Term Goal 1 - Progress (Week 1): Met OT Short Term Goal 2 (Week 1): Pt will don shirt with min A. OT Short Term Goal 2 - Progress (Week 1): Progressing toward goal OT Short Term Goal 3 (Week 1): Pt will don pants with mod A. OT Short Term Goal 3 - Progress (Week 1): Progressing toward goal OT Short Term Goal 4 (Week 1): Pt will perform RUE self ROM with mod cues. OT Short Term Goal 4 - Progress (Week 1): Met OT Short Term Goal 5 (Week 1): Pt will sit to stand at sink with max A of 1. OT Short Term Goal 5 - Progress (Week 1): Met Week 2:  OT Short Term Goal 1 (Week 2): Pt will don shirt with min A OT Short Term Goal 2 (Week 2): Pt will don pants with mod A OT Short Term Goal 2 - Progress (Week 2): Progressing toward goal OT Short Term Goal 3 (Week 2): Pt will perform BSC transfers with max A OT Short Term Goal 3 - Progress (Week 2): Met OT Short Term Goal 4 (Week 2): Pt will perform toileting tasks with max A OT Short Term Goal 4 - Progress (Week 2): Progressing toward goal Week 3:  OT Short Term Goal 1 (Week 3): Pt will don pants with mod A OT Short Term Goal 1 - Progress (Week 3): Met OT Short Term Goal 2 (Week 3): Pt will perform toileting tasks with max A OT Short Term Goal 2 - Progress (Week 3): Progressing toward goal OT Short Term Goal 3 (Week 3): Pt will complete bathing tasks with mod A sit<>stand from w/c at sink OT Short Term Goal 3 - Progress (Week 3): Met OT Short Term Goal 4 (Week 3): Pt will perform BSC transfers with mod A OT Short Term Goal 4 - Progress (Week 3): Met Week 4:  OT Short Term Goal 1 (Week 4): STGs = LTGs  Skilled  Therapeutic Interventions/Progress Updates:    Pt seen for skilled OT to facilitate dynamic balance and functional mobility with ADL retraining. Worked on supine to sidelying on L with facilitation for L knee flexion, pt able to push self up to sit with min A.  Squat pivot with close S. Pt completed grooming at sink then completed w/c to tub bench transfers with min A. She demonstrated improved control with multiple scoot pivots to move down length of tub bench. Pt used long handled sponge to wash L arm, back and feet. Sit to stand in shower with S, pt held bar as therapist washed bottom. In the room pt was able to sit to stand with S, then stabilized her balance using quad cane. Pt then able to release cane and used L hand to pull underwear and then pants over hips with light steadying A.   Pt taken to gym to continue working on standing balance skills from mat. Stood with L hand on support as she practiced wt shifts laterally and ant/post.  She then practiced reaching arm across midline to tough R hip, R knee, and overhead reaching with very light tactile support.  Sitting with R  hand on bar stool handle moving arm forward and back with PROM and tapping to facilitate muscular response. No active response of movement but pt seemed to be able to hold arm position for a few seconds.  Pt transferred back to w/c with S.  Pt self propelled back to room. Provided pt with a curved L hand fork to practice with as she states she is having difficulty self feeding with a fork. Pt in room with all needs met.  Therapy Documentation Precautions:  Precautions Precautions: Fall Precaution Comments: R lean, L ankle instability Required Braces or Orthoses: Other Brace/Splint (hand splint and PRAFO at night) Restrictions Weight Bearing Restrictions: No   Pain: Pain Assessment Pain Assessment: No/denies pain ADL: ADL ADL Comments: refer to functional navigator See Function Navigator for Current Functional  Status.   Therapy/Group: Individual Therapy  Purdy 01/06/2016, 11:25 AM

## 2016-01-07 ENCOUNTER — Inpatient Hospital Stay (HOSPITAL_COMMUNITY): Payer: Self-pay

## 2016-01-07 ENCOUNTER — Inpatient Hospital Stay (HOSPITAL_COMMUNITY): Payer: Medicare Other | Admitting: Physical Therapy

## 2016-01-07 ENCOUNTER — Inpatient Hospital Stay (HOSPITAL_COMMUNITY): Payer: Self-pay | Admitting: Speech Pathology

## 2016-01-07 NOTE — Progress Notes (Signed)
Speech Language Pathology Daily Session Note  Patient Details  Name: Sara [name] MRN: [id number] Date of Birth: 01/09/1981  Today's Date: 01/07/2016 SLP Individual Time: 0900-0955 SLP Individual Time Calculation (min): 55 min  Short Term Goals: Week 4: SLP Short Term Goal 1 (Week 4): Pt will navigate/use the computer/internet with supervision verbal cues.  SLP Short Term Goal 2 (Week 4): Patient will demonstrate complex reasoning and problem solving for novel tasks with supervision verbal cues.  SLP Short Term Goal 3 (Week 4): Patient will self-monitor and correct errors during complex tasks with min A verbal cues.    Skilled Therapeutic Interventions: Skilled treatment session focused on cognitive goals. SLP facilitated session by administering the Cognistat. Patient scored WFL's with all subtests except for memory and visual-spatial tasks in which she scored within the "mild impairment" range. Patient demonstrated selective attention to all tasks with Mod I in a moderately distracting environment. Patient left upright in wheelchair with all needs within reach. Continue with current plan of care.    Function:  Cognition Comprehension Comprehension assist level: Follows complex conversation/direction with extra time/assistive device  Expression   Expression assist level: Expresses complex ideas: With extra time/assistive device  Social Interaction Social Interaction assist level: Interacts appropriately with others with medication or extra time (anti-anxiety, antidepressant).  Problem Solving Problem solving assist level: Solves complex 90% of the time/cues < 10% of the time  Memory Memory assist level: Recognizes or recalls 90% of the time/requires cueing < 10% of the time    Pain Pain Assessment Pain Assessment: No/denies pain  Therapy/Group: Individual Therapy  Janyla Biscoe 01/07/2016, 12:02 PM

## 2016-01-07 NOTE — Progress Notes (Signed)
Occupational Therapy Session Note  Patient Details  Name: Sara SchaumannDebra C Taras MRN: [id number] Date of Birth: June 16, 1981  Today's Date: 01/07/2016 OT Individual Time: 1100-1200 OT Individual Time Calculation (min): 60 min    Short Term Goals: Week 4:  OT Short Term Goal 1 (Week 4): STGs = LTGs  Skilled Therapeutic Interventions/Progress Updates:    Pt resting in w/c upon arrival.  Pt engaged in BADL retraining including bathing and dressing with sit<>stand from w/c at sink.  Pt declined shower this morning but agreed to shower in ADL apartment tomorrow morning.  Pt completed UB bathing and dressing tasks at supervision level this morning.  Pt required steady A for standing balance and assistance to keep her legs crossed when doffing/donning shoes during LB bathing and dressing tasks.  Pt employs compensatory strategies/techniques appropriately and effectively.  Discussed bathroom arrangement and continued discharge planning.  Focus on activity tolerance, sit<>stand, standing balance, sitting balance, and safety awareness to increase independence with BADLs.  Therapy Documentation Precautions:  Precautions Precautions: Fall Precaution Comments: R lean, L ankle instability Required Braces or Orthoses: Other Brace/Splint (hand splint and PRAFO at night) Restrictions Weight Bearing Restrictions: No   Pain: Pain Assessment Pain Assessment: No/denies pain ADL: ADL ADL Comments: refer to functional navigator  See Function Navigator for Current Functional Status.   Therapy/Group: Individual Therapy  [name], Lorris Carducci Chappell 01/07/2016, 12:04 PM
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
## 2016-01-07 NOTE — Progress Notes (Signed)
Physical Therapy Session Note  Patient Details  Name: Sara [name] MRN: [id number] Date of Birth: June 14, 1981  Today's Date: 01/07/2016 PT Individual Time: 0800-0900, 1300-1400, and [id number]  PT Individual Time Calculation (min): 60 min, 60 min, and 30 min    Short Term Goals: Week 4:  PT Short Term Goal 1 (Week 4): = LTGs due to anticipated LOS  Skilled Therapeutic Interventions/Progress Updates:   Treatment 1: Patient in wheelchair upon arrival, donned TED hose and shoes with total [name]. Patient propelled to/from gym with mod I via L hemi technique. Patient performed squat pivot transfer to mat table with 2 attempts and supervision due to requiring cues for pivoting over feet as she was attempting to move laterally and hitting side of table with R hip. Seated EOM, performed HEP to address visual and visual-vestibular interaction impairments: binocular eye accomodation/convergence while reading lines of Snellen eye chart at 50% of original size at near focus and then regular size at 706ft away, x 1 viewing in sitting with horizontal and vertical head turns with target at arm's length to focus on gaze stabilization. Performed NuStep using BLE only at level 3 > 5 with RLE attachment to maintain neutral alignment x 20 min for forced use and and neuro re-ed RLE. Patient performed squat pivot transfer wheelchair <> NuStep with steady assist-supervision. Patient left in wheelchair, handoff to SLP.  Treatment 2: Patient in wheelchair, propelled via L hemi technique 2 x > 1000 ft with mod I in controlled environment and supervision outdoors on uneven surface with incline/decline. Performed actual car transfer to patient's father's truck via stand pivot x 1 with therapist and x 2 with father assisting with max faded to min cues for technique, mod [name] overall as patient needs assist to move RLE and to get R hip back on seat. Patient negotiated up/down 8 (6") stairs using L rail with max [name] to advance/place RLE with  step-to pattern. Gait training using L hemiwalker x 130 ft in controlled environment with max [name] to advance/safely place RLE due to increased extensor/adductor tone. Patient left sitting in wheelchair with father in room.   Treatment 3: Session focused on family training for practicing actual car transfer to patient's mother's 2-door Mustang via stand pivot x 1 with therapist demonstrating and x 2 with mother assisting patient for return demonstration with initial cues for sequencing and technique, mod [name] overall as patient needed assist for moving RLE/steady assist for balance. Patient left in wheelchair, safety plan updated for patient's parents and sister Gearldine BienenstockBrandy to assist with transfers bed <> wheelchair in room, patient verbalized understanding.   Therapy Documentation Precautions:  Precautions Precautions: Fall Precaution Comments: R lean, L ankle instability Required Braces or Orthoses: Other Brace/Splint (hand splint and PRAFO at night) Restrictions Weight Bearing Restrictions: No Pain: Pain Assessment Pain Assessment: No/denies pain  See Function Navigator for Current Functional Status.   Therapy/Group: Individual Therapy  Kerney ElbeVarner, Sara [name] 01/07/2016, 9:14 AM
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
## 2016-01-08 ENCOUNTER — Inpatient Hospital Stay (HOSPITAL_COMMUNITY): Payer: Self-pay

## 2016-01-08 ENCOUNTER — Inpatient Hospital Stay (HOSPITAL_COMMUNITY): Payer: Medicare Other | Admitting: Occupational Therapy

## 2016-01-08 ENCOUNTER — Inpatient Hospital Stay (HOSPITAL_COMMUNITY): Payer: Medicare Other | Admitting: Physical Therapy

## 2016-01-08 ENCOUNTER — Inpatient Hospital Stay (HOSPITAL_COMMUNITY): Payer: Medicare Other

## 2016-01-08 ENCOUNTER — Inpatient Hospital Stay (HOSPITAL_COMMUNITY): Payer: Self-pay | Admitting: Physical Therapy

## 2016-01-08 ENCOUNTER — Inpatient Hospital Stay (HOSPITAL_COMMUNITY): Payer: Medicare Other | Admitting: Speech Pathology

## 2016-01-08 MED ORDER — TIZANIDINE HCL 4 MG PO TABS
4.0000 mg | ORAL_TABLET | Freq: Three times a day (TID) | ORAL | Status: DC
  Administered 2016-01-08 – 2016-01-10 (×6): 4 mg via ORAL
  Filled 2016-01-08 (×6): qty 1

## 2016-01-08 NOTE — Progress Notes (Signed)
Speech Language Pathology Daily Session Note  Patient Details  Name: Sara [name] MRN: [id number] Date of Birth: 1981-01-26  Today's Date: 01/08/2016 SLP Individual Time: 0900-1000 SLP Individual Time Calculation (min): 60 min  Short Term Goals: Week 4: SLP Short Term Goal 1 (Week 4): Pt will navigate/use the computer/internet with supervision verbal cues.  SLP Short Term Goal 2 (Week 4): Patient will demonstrate complex reasoning and problem solving for novel tasks with supervision verbal cues.  SLP Short Term Goal 3 (Week 4): Patient will self-monitor and correct errors during complex tasks with min A verbal cues.    Skilled Therapeutic Interventions: Skilled treatment session focused on cognitive goals. SLP facilitated session by providing Min-Mod A question and verbal cues for strategies to maximize thought organization during a generative naming task within a 60 second time constraint. Patient was able to generate on average of ~8 items per category. Patient also participated in a functional conversation with focus on anticipatory awareness in regards d/c planning and f/u therapy with supervision verbal cues. Patient propelled herself back to her room with Mod I. Patient left upright in wheelchair with all needs within reach. Continue with current plan of care.    Function:  Cognition Comprehension Comprehension assist level: Follows complex conversation/direction with extra time/assistive device  Expression   Expression assist level: Expresses complex ideas: With extra time/assistive device  Social Interaction Social Interaction assist level: Interacts appropriately with others with medication or extra time (anti-anxiety, antidepressant).  Problem Solving Problem solving assist level: Solves complex 90% of the time/cues < 10% of the time  Memory Memory assist level: Recognizes or recalls 90% of the time/requires cueing < 10% of the time    Pain Pain Assessment Pain Assessment:  No/denies pain  Therapy/Group: Individual Therapy  Sara [name] 01/08/2016, 2:09 PM

## 2016-01-08 NOTE — Progress Notes (Signed)
Occupational Therapy Session Note  Patient Details  Name: Sara [name] MRN: [id number] Date of Birth: 11-03-1980  Today's Date: 01/08/2016 OT Individual Time: [id number] OT Individual Time Calculation (min): 58 min    Short Term Goals: Week 4:  OT Short Term Goal 1 (Week 4): STGs = LTGs  Skilled Therapeutic Interventions/Progress Updates:    Pt engaged in BADL retraining including bathing at shower level and dressing with sit<>stand from w/c at sink.  Focus on functional transfers, standing balance, sit<>stand, compensatory strategies for bathing/dressing tasks, and safety awareness.  Pt requires min A for sit<>stand and steady A when standing to pull up pants and bathe buttocks.  Pt's RUE currently has no active movement and does not assist with bathing/dressing tasks.  Pt exhibits increased awareness of her RLE prior to functional transfers to assure proper placement.    Therapy Documentation Precautions:  Precautions Precautions: Fall Precaution Comments: R lean, L ankle instability Required Braces or Orthoses: Other Brace/Splint (hand splint and PRAFO at night) Restrictions Weight Bearing Restrictions: No  Pain:  Pt denied pain ADL: ADL ADL Comments: refer to functional navigator  See Function Navigator for Current Functional Status.   Therapy/Group: Individual Therapy  [name], Pierre Dellarocco Chappell 01/08/2016, 9:01 AM

## 2016-01-08 NOTE — Progress Notes (Signed)
Social Work [name], female   DOB: 1981-04-27, 35 y.o.   MRN: [id number]   Have reviewed team conference info with pt and father.  Pt reports she is ready for d/c end of week and confirms that she prefers OP f/u.  [facility]  No concerns.  Jeanpierre Thebeau, LCSW
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
## 2016-01-08 NOTE — Progress Notes (Signed)
Physical Therapy Session Note  Patient Details  Name: Sara [name] MRN: [id number] Date of Birth: 01/04/1981  Today's Date: 01/08/2016 PT Individual Time: 1600-1630 PT Individual Time Calculation (min): 30 min   Short Term Goals: Week 4:  PT Short Term Goal 1 (Week 4): = LTGs due to anticipated LOS  Skilled Therapeutic Interventions/Progress Updates:    Pt received resting in w/c with no c/o pain and agreeable to therapy session.  Session focus on gait training, transfers, and NMR.  Pt propels w/c to and from therapy gym mod I.  Pt performs sit<>stand transfer with hemiwalker with steady assist.  Gait training x40' +20' +20' with hemiwalker and max assist to advance RLE.  Pt continues to demonstrate significant tone in RLE.  PT instructed pt in lateral leans to L focus on return to midline without using UE support.  Pt performed 2x5.  Pt returned to bed at end of session with mod assist for squat pivot transfer and positioned self in supine with min assist for RLE.  Pt left supine with call bell in reach and needs met.   Therapy Documentation Precautions:  Precautions Precautions: Fall Precaution Comments: R lean, L ankle instability Required Braces or Orthoses: Other Brace/Splint (hand splint and PRAFO at night) Restrictions Weight Bearing Restrictions: No   See Function Navigator for Current Functional Status.   Therapy/Group: Individual Therapy  Earnest Conroy Penven-Crew 01/08/2016, 4:55 PM

## 2016-01-08 NOTE — Discharge Instructions (Signed)
Inpatient Rehab Discharge Instructions  Sara SchaumannDebra C [name] Discharge date and time: No discharge date for patient encounter.   Activities/Precautions/ Functional Status: Activity: activity as tolerated Diet: regular diet Wound Care: keep wound clean and dry Functional status:  ___ No restrictions     ___ Walk up steps independently ___ 24/7 supervision/assistance   ___ Walk up steps with assistance ___ Intermittent supervision/assistance  ___ Bathe/dress independently ___ Walk with walker     _x__ Bathe/dress with assistance ___ Walk Independently    ___ Shower independently ___ Walk with assistance    ___ Shower with assistance ___ No alcohol     ___ Return to work/school ________    COMMUNITY REFERRALS UPON DISCHARGE:    Outpatient: PT     OT    ST                   Agency:  [facility]     Phone: [phone]                Appointment Date/Time:  6/6 @ 8:00 am (arrive @ 7:45am)  For PT and OT                                                                  6/7 @ 3:30 for Speech Therapy  Medical Equipment/Items Ordered: wheelchair, cushion, drop arm commode and tub bench                                                     Agency/Supplier:  [facility] @ [phone]   GENERAL COMMUNITY RESOURCES FOR PATIENT/FAMILY:  Support Groups: Brain Injury Support Group (see handout)    Mental Health:  Neuropsychologist in [place]:   Dr. Arley PhenixJohn Rodenbough, PsyD @ [phone]         Special Instructions: Follow-up with Dr. Faith RogueZachary Swartz [address] [place], [place] phone number [phone]. Office to call for appointment   My questions have been answered and I understand these instructions. I will adhere to these goals and the provided educational materials after my discharge from the hospital.  Patient/Caregiver Signature _______________________________ Date __________  Clinician Signature _______________________________________ Date  __________  Please bring this form and your medication list with you to all your follow-up doctor's appointments.

## 2016-01-08 NOTE — Progress Notes (Signed)
Occupational Therapy Session Note  Patient Details  Name: Sara [name] MRN: [id number] Date of Birth: [birth year]  Today's Date: 01/08/2016 OT Individual Time: 3546-5681 OT Individual Time Calculation (min): 38 min    Short Term Goals: Week 3:  OT Short Term Goal 1 (Week 3): Pt will don pants with mod A OT Short Term Goal 1 - Progress (Week 3): Met OT Short Term Goal 2 (Week 3): Pt will perform toileting tasks with max A OT Short Term Goal 2 - Progress (Week 3): Progressing toward goal OT Short Term Goal 3 (Week 3): Pt will complete bathing tasks with mod A sit<>stand from w/c at sink OT Short Term Goal 3 - Progress (Week 3): Met OT Short Term Goal 4 (Week 3): Pt will perform BSC transfers with mod A OT Short Term Goal 4 - Progress (Week 3): Met  Skilled Therapeutic Interventions/Progress Updates: Patient participated in skilled OT as follows this session:  Supine to edge of bed with putting right foot under left = Mod A and extra time (she was a little stuck in the hole in the middle of the bed)  Donned shoes edge of bed (crossing legs method)= Min to Mod A to hold sitting balance and stabilizing right foot during the task  Bed to wide 3:1 toilet transfer =  Mod A stand pivot with stabilization and assist turning right foot during the transfer and maintain balance Clothing mgmt= Mod A for trunk control and balance and extra time  Doffed shoes edge of bed =Min to Mod A  Sit to stand x 6 to get to head of bed before lying down=Min Assist with Moderate assistance to maintain balance  Participated in right shoulder passive lateral abduction and was able to tolerate 0-80 degrees before complaining of "tightness" in bicep Participated in right shoulder passive flexion full ROM without complaints of pain or tightness Participated in supination/pronation and wrist and digital ROM without c/o pain or tightness - she tended to sit with right hand digits moderately  toned and  flexed  Patient was left with call bell within reach   --------------------- Patient will benefit from more opportunities for right foot placement/technique for static and dynamic activities and transfers; as well will benefit from more sitting and standing balance activiites  Therapy Documentation Precautions:  Precautions Precautions: Fall Precaution Comments: R lean, L ankle instability Required Braces or Orthoses: Other Brace/Splint (hand splint and PRAFO at night) Restrictions Weight Bearing Restrictions: No  Pain:denied    See Function Navigator for Current Functional Status.   Therapy/Group: Individual Therapy  Herschell Dimes 01/08/2016, 6:33 PM
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
## 2016-01-08 NOTE — Progress Notes (Addendum)
Physical Therapy Session Note  Patient Details  Name: Dennard SchaumannDebra C Merta MRN: [id number] Date of Birth: 1981-04-16  Today's Date: 01/08/2016 PT Individual Time: 1040-1225 PT Individual Time Calculation (min): 105 min   Short Term Goals: Week 4:  PT Short Term Goal 1 (Week 4): = LTGs due to anticipated LOS  Skilled Therapeutic Interventions/Progress Updates:   Community outing with recreational therapist to [employer] with focus on community wheelchair mobility, squat pivot/stand pivot transfer training with patient's sister and mom in restaurant and bathroom, overall problem solving and energy conservation with community mobility, recall, and continued education on f/u OPPT, DME, and discharge planning. Patient returned to room and left sitting in wheelchair with needs in reach. See shadow chart for full details.   Therapy Documentation Precautions:  Precautions Precautions: Fall Precaution Comments: R lean, L ankle instability Required Braces or Orthoses: Other Brace/Splint (hand splint and PRAFO at night) Restrictions Weight Bearing Restrictions: No Pain: Pain Assessment Pain Assessment: No/denies pain   See Function Navigator for Current Functional Status.   Therapy/Group: Individual Therapy  Kerney ElbeVarner, Jilliam Bellmore A 01/08/2016, 1:01 PM

## 2016-01-09 ENCOUNTER — Inpatient Hospital Stay (HOSPITAL_COMMUNITY): Payer: Self-pay

## 2016-01-09 ENCOUNTER — Inpatient Hospital Stay (HOSPITAL_COMMUNITY): Payer: Medicare Other

## 2016-01-09 ENCOUNTER — Inpatient Hospital Stay (HOSPITAL_COMMUNITY): Payer: Medicare Other | Admitting: Speech Pathology

## 2016-01-09 ENCOUNTER — Inpatient Hospital Stay (HOSPITAL_COMMUNITY): Payer: Self-pay | Admitting: Physical Therapy

## 2016-01-09 NOTE — Telephone Encounter (Signed)
Absolutely ok for wheelchair.
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
## 2016-01-09 NOTE — Discharge Summary (Signed)
Discharge summary job # [id number]

## 2016-01-09 NOTE — Progress Notes (Addendum)
Physical Therapy Note  Patient Details  Name: Sara SchaumannDebra C [name] MRN: [id number] Date of Birth: 12-22-1980 Today's Date: 01/09/2016   Pain: none noted 1030-1110, 40 min  Trial of heel lift L shoe, R AFO for gait x 12'.  Pt able to passively drop R knee but unable to advance RLE.  RAFO not useful for advancing LE; removed.  Up/down 12 steps 1 rail, max assist with pt able to passively drop R knee appropriately 10/12 steps.  Heel lift appears to help with assisted advancement of RLE.  Pt left resting in w/c with all needs within reach. Hypertonus RLE continues to limit her gait.  Illianna Paschal 01/09/2016, 7:51 AM

## 2016-01-09 NOTE — Progress Notes (Signed)
Occupational Therapy Discharge Summary  Patient Details  Name: Sara [name] MRN: [id number] Date of Birth: 12-26-1980   Patient has met 12 of 12 long term goals due to improved activity tolerance, improved balance, postural control, ability to compensate for deficits, improved attention, improved awareness and improved coordination.  Pt made steady progress with BADLs during this admission.  Pt requires supervision for bathing at shower level and UB dressing.  Pt requires min A for LB dressing, toilet transfers, and shower transfers, and mod A for toileting tasks.  Pt's RUE continues to exhibit increased tone in the hand with no active movement noted.  Pt incorporates compensatory strategies and adaptive equipment to increase independence with BADLs. Pt exhibit behaviors consistent with Rancho Level VIII. Patient to discharge at [facility] Assist level.  Patient's care partner is independent to provide the necessary physical and cognitive assistance at discharge.      Recommendation:  Patient will benefit from ongoing skilled OT services in outpatient setting to continue to advance functional skills in the area of BADL and iADL.  Equipment: Drop arm BSC, tub transfer bench  Reasons for discharge: treatment goals met  Patient/family agrees with progress made and goals achieved: Yes  OT Discharge ADL ADL ADL Comments: refer to functional navigator Vision/Perception  Vision- History Baseline Vision/History: No visual deficits Patient Visual Report: No change from baseline Vision- Assessment Vision Assessment?: Yes Eye Alignment: Within Functional Limits Ocular Range of Motion: Restricted on the right Alignment/Gaze Preference: Within Defined Limits Tracking/Visual Pursuits: Decreased smoothness of eye movement to LEFT superior field Saccades: Within functional limits Convergence: Impaired (comment) Visual Fields: No apparent deficits  Cognition Arousal/Alertness:  Awake/alert Orientation Level: Oriented X4 Attention: Divided Focused Attention: Appears intact Alternating Attention: Appears intact Divided Attention: Impaired Divided Attention Impairment: Functional basic Memory: Appears intact Awareness: Appears intact Initiating: Appears intact Safety/Judgment: Appears intact Rancho [employer] Scales of Cognitive Functioning: Purposeful/appropriate Sensation Sensation Light Touch: Appears Intact Hot/Cold: Appears Intact Proprioception: Impaired Detail Proprioception Impaired Details: Impaired RUE Coordination Gross Motor Movements are Fluid and Coordinated: No Fine Motor Movements are Fluid and Coordinated: No Coordination and Movement Description: R hemiplegia with spasticity Motor  Motor Motor: Hemiplegia;Motor apraxia;Abnormal tone;Abnormal postural alignment and control Trunk/Postural Assessment  Cervical Assessment Cervical Assessment: Exceptions to [facility] (forward head) Thoracic Assessment Thoracic Assessment: Exceptions to [facility] (kyphosis) Lumbar Assessment Lumbar Assessment: Exceptions to [facility] (posterior pelvic tilt)  Balance Static Sitting Balance Static Sitting - Level of Assistance: 6: Modified independent (Device/Increase time) Dynamic Sitting Balance Dynamic Sitting - Level of Assistance: 5: Stand by assistance  Dynamic standing balance - min A Extremity/Trunk Assessment RUE Assessment RUE Assessment: Exceptions to [facility] (hemiplegia); with hypertone in biceps and finger flexors LUE Assessment LUE Assessment: Within Functional Limits   See Function Navigator for Current Functional Status.  Sara [name] [facility] 01/09/2016, 1:47 PM

## 2016-01-09 NOTE — Progress Notes (Signed)
Speech Language Pathology Session Note & Discharge Summary  Patient Details  Name: Sara [name] MRN: [id number] Date of Birth: 26-Nov-1980  Today's Date: 01/09/2016 SLP Individual Time: 0930-1025 SLP Individual Time Calculation (min): 55 min   Skilled Therapeutic Interventions:  Skilled treatment session focused on cognitive goals. SLP facilitated a functional conversation with focus on discharge and generating a list of activities she can't complete at home to stay both physically and cognitively active, she completed task with Mod I. Patient also completed complex visual-perceptual  tasks with extra time and 50% accuracy, suspect fatigue impacted function. Patient left upright in wheelchair with all needs within reach.     Patient has met 4 of 4 long term goals.  Patient to discharge at overall Supervision level.   Reasons goals not met: N/A   Clinical Impression/Discharge Summary: Patient has made excellent gains and has met 4 of 4 LTG's this admission due to improved swallowing and cognitive function. Currently, patient is consuming regular textures with thin liquids without overt s/s of aspiration and is Mod I for use of swallowing compensatory strategies. Patient is also demonstrating behaviors consistent with a Rancho Level VIII and requires overall supervision to complete functional and mildy complex tasks safely in regards to functional problem solving, recall and awareness. Patient and family education is complete and patient will discharge home with 24 hour supervision from family. Patient would benefit from f/u outpatient SLP services to maximize her cognitive function and overall functional independence to reduce caregiver burden.   Care Partner:  Caregiver Able to Provide Assistance: Yes  Type of Caregiver Assistance: Physical;Cognitive  Recommendation:  24 hour supervision/assistance;Outpatient SLP  Rationale for SLP Follow Up: Maximize cognitive function and  independence;Reduce caregiver burden   Equipment: N/A   Reasons for discharge: Treatment goals met;Discharged from hospital   Patient/Family Agrees with Progress Made and Goals Achieved: Yes   Function:  Eating Eating   Modified Consistency Diet: No Eating Assist Level: No help, No cues   Eating Set Up Assist For: Opening containers       Cognition Comprehension Comprehension assist level: Follows complex conversation/direction with extra time/assistive device  Expression   Expression assist level: Expresses complex ideas: With extra time/assistive device  Social Interaction Social Interaction assist level: Interacts appropriately with others with medication or extra time (anti-anxiety, antidepressant).  Problem Solving Problem solving assist level: Solves complex 90% of the time/cues < 10% of the time  Memory Memory assist level: Recognizes or recalls 90% of the time/requires cueing < 10% of the time   Twilla Khouri 01/09/2016, 3:41 PM

## 2016-01-09 NOTE — Progress Notes (Signed)
Occupational Therapy Session Note  Patient Details  Name: Sara SchaumannDebra C Lerman MRN: [id number] Date of Birth: 1980-10-28  Today's Date: 01/09/2016 OT Individual Time: 0800-0930 OT Individual Time Calculation (min): 90 min    Short Term Goals: Week 4:  OT Short Term Goal 1 (Week 4): STGs = LTGs  Skilled Therapeutic Interventions/Progress Updates:    Pt engaged in BADL retraining including bathing at shower level (tub/shower) and dressing with sit<>stand from w/c.  Pt completes bathing seated on tub bench utilizing lateral leans to bathe buttocks and long handle sponge to bathe back and BLE.  Pt completes dressing with steady A for standing to pull up pants.  Pt practiced tub bench transfers and drop arm BSC transfers X 3.  Discussed importance of practicing these transfers several times at home before [facility] or taking a shower.  Pt verbalized understanding.  Discussed with patient the importance of notifying her "helper" when she is going to make a transitional movement (standing or transferring). Pt verbalized understanding.  Pt remained in w/c with all needs within reach.   Therapy Documentation Precautions:  Precautions Precautions: Fall Precaution Comments: R lean, L ankle instability Required Braces or Orthoses: Other Brace/Splint (hand splint and PRAFO at night) Restrictions Weight Bearing Restrictions: No Pain:  Pt denied pain ADL: ADL ADL Comments: refer to functional navigator  See Function Navigator for Current Functional Status.   Therapy/Group: Individual Therapy  [name], Thurman Sarver Chappell 01/09/2016, 9:44 AM
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
## 2016-01-09 NOTE — Progress Notes (Signed)
Physical Therapy Discharge Summary  Patient Details  Name: Sara [name] MRN: [id number] Date of Birth: 03-Sep-1980  Today's Date: 01/09/2016 PT Individual Time: 4401-0272 PT Individual Time Calculation (min): 57 min    Patient has met 9 of 9 long term goals due to improved activity tolerance, improved balance, improved postural control, increased strength, increased range of motion, ability to compensate for deficits, improved attention, improved awareness and improved coordination.  Patient to discharge at a wheelchair level Min Assist.   Patient's care partner is independent to provide the necessary physical assistance at discharge.  Recommendation:  Patient will benefit from ongoing skilled PT services in outpatient setting to continue to advance safe functional mobility, address ongoing impairments in muscle tone, weakness, balance, strength, flexibility, and activity tolerance, and minimize fall risk.  Equipment: 18x18 hemi height w/c  Reasons for discharge: treatment goals met  Patient/family agrees with progress made and goals achieved: Yes   Skilled Therapeutic Intervention: Pt received resting in w/c and agreeable to therapy session.  Session focus on transfers, gait, and patient education.    Pt propels w/c throughout unit, max distance 200', with mod I.  Pt able to negotiate w/c up/down 3% grade ramp mod I.    PT instructed pt in car transfer to simulated truck height with max assist.  PT provided pt education on positioning during transfer to reduce RLE tone as much as possible to make it easier to lift into vehicle at d/c.  Pt verbalized understanding.    Gait training x50' with hemiwalker and max assist to advance RLE 2/2 tone.    Pt transfers throughout session squat/pivot with min assist.    Pt returned to room at end of session and positioned in bed with min assist, call bell in reach and needs met.   PT  Discharge Precautions/Restrictions Precautions Precautions: Fall Precaution Comments: RLE hypertonicity, RUE hypotonicity Required Braces or Orthoses: Other Brace/Splint Other Brace/Splint: R hand splint and R PRAFO at night Restrictions Weight Bearing Restrictions: No  Pain Pain Assessment Pain Assessment: No/denies pain Vision/Perception  Vision - Assessment Eye Alignment: Within Functional Limits Ocular Range of Motion: Restricted on the right Alignment/Gaze Preference: Within Defined Limits Tracking/Visual Pursuits: Decreased smoothness of eye movement to LEFT superior field Saccades: Within functional limits Convergence: Impaired (comment)  Cognition Overall Cognitive Status: Impaired/Different from baseline Arousal/Alertness: Awake/alert Orientation Level: Oriented X4 Attention: Divided Focused Attention: Appears intact Alternating Attention: Appears intact Divided Attention: Impaired Divided Attention Impairment: Functional basic Memory: Impaired Memory Impairment: Decreased recall of new information Awareness: Appears intact Problem Solving: Impaired Problem Solving Impairment: Functional complex Initiating: Appears intact Safety/Judgment: Appears intact [place] Scales of Cognitive Functioning: Purposeful/appropriate Sensation Sensation Light Touch: Appears Intact Hot/Cold: Appears Intact Proprioception: Impaired Detail Proprioception Impaired Details: Impaired RUE Coordination Gross Motor Movements are Fluid and Coordinated: No Fine Motor Movements are Fluid and Coordinated: No Coordination and Movement Description: R hemiplegia with spasticity Motor  Motor Motor: Hemiplegia;Abnormal tone;Abnormal postural alignment and control Motor - Discharge Observations: ongoing RLE significant tone  Mobility Transfers Transfers: Yes Sit to Stand: 4: Min assist Stand to Sit: 4: Min guard Squat Pivot Transfers: 4: Min assist Locomotion   Ambulation Ambulation: Yes Ambulation/Gait Assistance: 2: Max Environmental consultant (Feet): 50 Feet Assistive device: Hemi-walker Ambulation/Gait Assistance Details: Tactile cues for posture;Manual facilitation for weight shifting Ambulation/Gait Assistance Details: assist to bring RLE through Gait Gait: Yes Gait Pattern: Impaired Gait Pattern: Step-to pattern;Decreased step length - right Gait velocity: slow Stairs / Additional  Locomotion Stairs: Yes (assessed by Wanda Plump, PT during AM session) Stairs Assistance: 2: Max assist Stair Management Technique: One rail Left;Forwards Number of Stairs: Nurse, learning disability: Yes Wheelchair Assistance: 6: Modified independent (Device/Increase time) Wheelchair Propulsion: Left lower extremity;Left upper extremity Wheelchair Parts Management: Supervision/cueing Distance: 200  Trunk/Postural Assessment  Cervical Assessment Cervical Assessment: Exceptions to [facility] (mild forward head) Thoracic Assessment Thoracic Assessment: Exceptions to [facility] (rounded shoulders) Lumbar Assessment Lumbar Assessment: Exceptions to [facility] (decreased lumbar lordosis, posterior tilt but corrects with verbal cues) Postural Control Righting Reactions: intact Protective Responses: intact  Balance Static Sitting Balance Static Sitting - Balance Support: Feet unsupported;Left upper extremity supported;No upper extremity supported Static Sitting - Level of Assistance: 6: Modified independent (Device/Increase time) Dynamic Sitting Balance Dynamic Sitting - Level of Assistance: 5: Stand by assistance Static Standing Balance Static Standing - Balance Support: During functional activity;Right upper extremity supported Static Standing - Level of Assistance: 4: Min assist Dynamic Standing Balance Dynamic Standing - Level of Assistance: 3: Mod assist Extremity Assessment  RUE Assessment RUE Assessment: Exceptions to [facility] (hemiplegia) LUE  Assessment LUE Assessment: Within Functional Limits RLE Strength RLE Overall Strength Comments: 0/5 RLE Tone RLE Tone: Hypertonic Modified Ashworth Scale for Grading Hypertonia RLE: Considerable increase in muschle tone, passive movement difficult LLE Assessment LLE Assessment: Within Functional Limits LLE AROM (degrees) LLE Overall AROM Comments: WFL assessed in sitting LLE Strength LLE Overall Strength: Within Functional Limits for tasks assessed Left Hip Flexion: 4/5 Left Knee Flexion: 4+/5 Left Knee Extension: 4+/5 Left Ankle Dorsiflexion: 4+/5 Left Ankle Plantar Flexion: 4+/5 LLE Tone LLE Tone Comments: normal   See Function Navigator for Current Functional Status.  Chyler Creely E Penven-Crew 01/09/2016, 4:23 PM

## 2016-01-09 NOTE — Progress Notes (Signed)
Occupational Therapy Note  Patient Details  Name: Sara [name] MRN: [id number] Date of Birth: June 30, 1981  Today's Date: 01/09/2016 OT Individual Time: 1300-1400 OT Individual Time Calculation (min): 60 min   Pt denied pain Individual therapy  Pt resting in w/c upon arrival.  Pt propelled to tub room and ADL apartment to continue practice tub transfer bench transfers, drop arm BSC transfers, and bed transfers.  Discussed importance of correct placement of w/c for safe transfers.  Pt verbalized understanding.  Pt also engaged in w/c mobility in kitchen and practice retrieving items from refrigerator.  Continued ongoing education in preparation for discharge tomorrow.  Pt had not received DME and therapist followed up with CSW.    [name], Mariane Burpee [facility] 01/09/2016, 2:01 PM

## 2016-01-10 MED ORDER — CLONAZEPAM 0.5 MG PO TABS: 0.5000 mg | ORAL_TABLET | Freq: Three times a day (TID) | ORAL | Status: DC | PRN

## 2016-01-10 MED ORDER — GABAPENTIN 300 MG PO CAPS
900.0000 mg | ORAL_CAPSULE | Freq: Every day | ORAL | Status: DC
Start: 2016-01-10 — End: 2016-01-21

## 2016-01-10 MED ORDER — TIZANIDINE HCL 4 MG PO TABS: 4.0000 mg | ORAL_TABLET | Freq: Three times a day (TID) | ORAL | Status: AC

## 2016-01-10 MED ORDER — TRAMADOL HCL 50 MG PO TABS: 50.0000 mg | ORAL_TABLET | Freq: Four times a day (QID) | ORAL | Status: DC | PRN

## 2016-01-10 MED ORDER — LAMOTRIGINE 100 MG PO TABS: 100.0000 mg | ORAL_TABLET | Freq: Every day | ORAL | Status: AC

## 2016-01-10 MED ORDER — AMANTADINE HCL 100 MG PO CAPS: 100.0000 mg | ORAL_CAPSULE | Freq: Every day | ORAL | Status: AC

## 2016-01-10 MED ORDER — TRAZODONE HCL 50 MG PO TABS: 100.0000 mg | ORAL_TABLET | Freq: Every day | ORAL | Status: AC

## 2016-01-10 MED ORDER — BACLOFEN 20 MG PO TABS: 20.0000 mg | ORAL_TABLET | Freq: Three times a day (TID) | ORAL | Status: AC

## 2016-01-10 MED ORDER — VENLAFAXINE HCL 75 MG PO TABS: 75.0000 mg | ORAL_TABLET | Freq: Three times a day (TID) | ORAL | Status: AC
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
## 2016-01-10 NOTE — Telephone Encounter (Signed)
Spoke to mother.  Patient has been discharged and has wheelchair.

## 2016-01-10 NOTE — Progress Notes (Signed)
Patient,and family given discharge information by Malissa Hippoan A., PA, all questions answered. Patient verbalized understanding. Patient assisted out to car via wheelchair with all belongings- nurse tech assistance.[place]

## 2016-01-10 NOTE — Progress Notes (Signed)
Social Work  Discharge Note  The overall goal for the admission was met for:   Discharge location: Yes - home with father;  Multiple family members sharing in provision of 24/7 assist  Length of Stay: Yes - 19 days  Discharge activity level: Yes - min assistance w/c level overall  Home/community participation: Yes  Services provided included: MD, RD, PT, OT, SLP, RN, TR, Pharmacy, Neuropsych and SW  Financial Services: Medicare and Medicaid  Follow-up services arranged: Outpatient: PT, OT, ST via [facility], DME: 18x16 hemi, lightweight w/c with right 1/2 lap tray and cushion; wide drop arm commode, tub bench all via [place] and Patient/Family has no preference for HH/DME agencies  Comments (or additional information):  Patient/Family verbalized understanding of follow-up arrangements: Yes  Individual responsible for coordination of the follow-up plan: pt  Confirmed correct DME delivered: Daltyn Degroat 01/10/2016    Skyann Ganim

## 2016-01-14 ENCOUNTER — Telehealth: Payer: Self-pay

## 2016-01-14 ENCOUNTER — Ambulatory Visit: Payer: Medicare Other | Admitting: Occupational Therapy

## 2016-01-14 ENCOUNTER — Ambulatory Visit: Payer: Medicare Other | Attending: Physical Medicine & Rehabilitation

## 2016-01-14 ENCOUNTER — Encounter: Payer: Self-pay | Admitting: Occupational Therapy

## 2016-01-14 ENCOUNTER — Ambulatory Visit: Payer: Medicare Other

## 2016-01-14 DIAGNOSIS — G8191 Hemiplegia, unspecified affecting right dominant side: Secondary | ICD-10-CM

## 2016-01-14 DIAGNOSIS — R29818 Other symptoms and signs involving the nervous system: Secondary | ICD-10-CM | POA: Insufficient documentation

## 2016-01-14 DIAGNOSIS — R41840 Attention and concentration deficit: Secondary | ICD-10-CM | POA: Diagnosis present

## 2016-01-14 DIAGNOSIS — R2689 Other abnormalities of gait and mobility: Secondary | ICD-10-CM | POA: Diagnosis not present

## 2016-01-14 DIAGNOSIS — R41841 Cognitive communication deficit: Secondary | ICD-10-CM | POA: Insufficient documentation

## 2016-01-14 DIAGNOSIS — M25511 Pain in right shoulder: Secondary | ICD-10-CM | POA: Diagnosis present

## 2016-01-14 DIAGNOSIS — R208 Other disturbances of skin sensation: Secondary | ICD-10-CM | POA: Diagnosis present

## 2016-01-14 NOTE — Telephone Encounter (Signed)
1. Are you/is patient experiencing any problems since coming home? Are there any questions regarding any aspect of care? No 2. Are there any questions regarding medications administration/dosing? Are meds being taken as prescribed? Patient should review meds with caller to confirm. Meds have been confirmed. 3. Have there been any falls? No 4. Has Home Health been to the house and/or have they contacted you? If not, have you tried to contact them? Can we help you contact them? Outpatient PT evaluation today. 5. Are bowels and bladder emptying properly? Are there any unexpected incontinence issues? If applicable, is patient following bowel/bladder programs? No issues. 6. Any fevers, problems with breathing, unexpected pain? No issues. 7. Are there any skin problems or new areas of breakdown? No issues. 8. Has the patient/family member arranged specialty MD follow up (ie cardiology/neurology/renal/surgical/etc)?  Can we help arrange? Follow up appointments have been. 9. Does the patient need any other services or support that we can help arrange? No 10. Are caregivers following through as expected in assisting the patient? Yes, dad. 11. Has the patient quit smoking, drinking alcohol, or using drugs as recommended? Pt is not smoking, drinking alcohol, or using drugs.  Pt is aware of appointment on 02/26/16 at 1:00 pm.
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
## 2016-01-15 ENCOUNTER — Ambulatory Visit: Payer: Medicare Other
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
## 2016-01-17 ENCOUNTER — Other Ambulatory Visit: Payer: Self-pay | Admitting: Family Medicine

## 2016-01-17 ENCOUNTER — Ambulatory Visit (INDEPENDENT_AMBULATORY_CARE_PROVIDER_SITE_OTHER): Payer: Medicare Other | Admitting: Family Medicine

## 2016-01-17 ENCOUNTER — Encounter: Payer: Self-pay | Admitting: Family Medicine

## 2016-01-17 VITALS — BP 128/62 | HR 96 | Temp 97.7°F | Resp 16 | Ht 61.0 in

## 2016-01-17 DIAGNOSIS — S0291XD Unspecified fracture of skull, subsequent encounter for fracture with routine healing: Secondary | ICD-10-CM

## 2016-01-17 DIAGNOSIS — W3400XS Accidental discharge from unspecified firearms or gun, sequela: Secondary | ICD-10-CM | POA: Diagnosis not present

## 2016-01-17 DIAGNOSIS — Z09 Encounter for follow-up examination after completed treatment for conditions other than malignant neoplasm: Secondary | ICD-10-CM | POA: Diagnosis not present

## 2016-01-17 DIAGNOSIS — D62 Acute posthemorrhagic anemia: Secondary | ICD-10-CM

## 2016-01-17 DIAGNOSIS — F329 Major depressive disorder, single episode, unspecified: Secondary | ICD-10-CM

## 2016-01-17 DIAGNOSIS — F32A Depression, unspecified: Secondary | ICD-10-CM
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
## 2016-01-18 ENCOUNTER — Encounter: Payer: Self-pay | Admitting: Family Medicine

## 2016-01-18 LAB — CBC WITH DIFFERENTIAL/PLATELET
BASOS ABS: 0 {cells}/uL (ref 0–200)
Basophils Relative: 0 %
EOS ABS: 231 {cells}/uL (ref 15–500)
Eosinophils Relative: 3 %
HCT: 28 % — ABNORMAL LOW (ref 35.0–45.0)
Hemoglobin: 8.4 g/dL — CL (ref 12.0–15.0)
LYMPHS PCT: 18 %
Lymphs Abs: 1386 cells/uL (ref 850–3900)
MCH: 25.1 pg — AB (ref 27.0–33.0)
MCHC: 30 g/dL — ABNORMAL LOW (ref 32.0–36.0)
MCV: 83.8 fL (ref 80.0–100.0)
MONOS PCT: 6 %
MPV: 9.9 fL (ref 7.5–12.5)
Monocytes Absolute: 462 cells/uL (ref 200–950)
Neutro Abs: 5621 cells/uL (ref 1500–7800)
Neutrophils Relative %: 73 %
PLATELETS: 430 10*3/uL — AB (ref 140–400)
RBC: 3.34 MIL/uL — ABNORMAL LOW (ref 3.80–5.10)
RDW: 14.7 % (ref 11.0–15.0)
WBC: 7.7 10*3/uL (ref 3.8–10.8)

## 2016-01-18 LAB — COMPLETE METABOLIC PANEL WITH GFR
ALBUMIN: 3.1 g/dL — AB (ref 3.6–5.1)
ALK PHOS: 106 U/L (ref 33–115)
ALT: 8 U/L (ref 6–29)
AST: 10 U/L (ref 10–30)
BILIRUBIN TOTAL: 0.2 mg/dL (ref 0.2–1.2)
BUN: 6 mg/dL — ABNORMAL LOW (ref 7–25)
CO2: 22 mmol/L (ref 20–31)
Calcium: 8.5 mg/dL — ABNORMAL LOW (ref 8.6–10.2)
Chloride: 108 mmol/L (ref 98–110)
Creat: 0.65 mg/dL (ref 0.50–1.10)
Glucose, Bld: 128 mg/dL — ABNORMAL HIGH (ref 70–99)
Potassium: 4.3 mmol/L (ref 3.5–5.3)
SODIUM: 141 mmol/L (ref 135–146)
TOTAL PROTEIN: 6.1 g/dL (ref 6.1–8.1)

## 2016-01-20 ENCOUNTER — Encounter: Payer: Self-pay | Admitting: Family Medicine

## 2016-01-21 ENCOUNTER — Ambulatory Visit: Payer: Medicare Other | Admitting: Occupational Therapy

## 2016-01-21 ENCOUNTER — Encounter: Payer: Self-pay | Admitting: Occupational Therapy

## 2016-01-21 ENCOUNTER — Encounter: Payer: Self-pay | Admitting: Family Medicine

## 2016-01-21 ENCOUNTER — Encounter: Payer: Medicare Other | Attending: Physical Medicine & Rehabilitation | Admitting: Physical Medicine & Rehabilitation

## 2016-01-21 ENCOUNTER — Encounter: Payer: Self-pay | Admitting: Physical Medicine & Rehabilitation

## 2016-01-21 VITALS — BP 109/72 | HR 79 | Resp 16

## 2016-01-21 DIAGNOSIS — R5383 Other fatigue: Secondary | ICD-10-CM | POA: Insufficient documentation

## 2016-01-21 DIAGNOSIS — Z8673 Personal history of transient ischemic attack (TIA), and cerebral infarction without residual deficits: Secondary | ICD-10-CM | POA: Diagnosis not present

## 2016-01-21 DIAGNOSIS — R2689 Other abnormalities of gait and mobility: Secondary | ICD-10-CM | POA: Diagnosis not present

## 2016-01-21 DIAGNOSIS — G8191 Hemiplegia, unspecified affecting right dominant side: Secondary | ICD-10-CM

## 2016-01-21 DIAGNOSIS — G8111 Spastic hemiplegia affecting right dominant side: Secondary | ICD-10-CM | POA: Insufficient documentation

## 2016-01-21 DIAGNOSIS — R208 Other disturbances of skin sensation: Secondary | ICD-10-CM

## 2016-01-21 DIAGNOSIS — R531 Weakness: Secondary | ICD-10-CM | POA: Diagnosis not present

## 2016-01-21 DIAGNOSIS — W3400XA Accidental discharge from unspecified firearms or gun, initial encounter: Secondary | ICD-10-CM | POA: Insufficient documentation

## 2016-01-21 DIAGNOSIS — M545 Low back pain: Secondary | ICD-10-CM | POA: Insufficient documentation

## 2016-01-21 DIAGNOSIS — R41840 Attention and concentration deficit: Secondary | ICD-10-CM

## 2016-01-21 DIAGNOSIS — I10 Essential (primary) hypertension: Secondary | ICD-10-CM | POA: Insufficient documentation

## 2016-01-21 DIAGNOSIS — S069X0A Unspecified intracranial injury without loss of consciousness, initial encounter: Secondary | ICD-10-CM | POA: Insufficient documentation

## 2016-01-21 DIAGNOSIS — G8929 Other chronic pain: Secondary | ICD-10-CM | POA: Diagnosis not present

## 2016-01-21 DIAGNOSIS — F419 Anxiety disorder, unspecified: Secondary | ICD-10-CM | POA: Insufficient documentation

## 2016-01-21 DIAGNOSIS — F4321 Adjustment disorder with depressed mood: Secondary | ICD-10-CM | POA: Diagnosis not present

## 2016-01-21 DIAGNOSIS — E669 Obesity, unspecified: Secondary | ICD-10-CM | POA: Diagnosis not present

## 2016-01-21 DIAGNOSIS — K219 Gastro-esophageal reflux disease without esophagitis: Secondary | ICD-10-CM | POA: Diagnosis not present

## 2016-01-21 DIAGNOSIS — F329 Major depressive disorder, single episode, unspecified: Secondary | ICD-10-CM | POA: Insufficient documentation

## 2016-01-21 DIAGNOSIS — S069X3S Unspecified intracranial injury with loss of consciousness of 1 hour to 5 hours 59 minutes, sequela: Secondary | ICD-10-CM

## 2016-01-21 DIAGNOSIS — E559 Vitamin D deficiency, unspecified: Secondary | ICD-10-CM | POA: Insufficient documentation

## 2016-01-21 DIAGNOSIS — G4733 Obstructive sleep apnea (adult) (pediatric): Secondary | ICD-10-CM | POA: Insufficient documentation

## 2016-01-21 DIAGNOSIS — E785 Hyperlipidemia, unspecified: Secondary | ICD-10-CM | POA: Insufficient documentation

## 2016-01-21 DIAGNOSIS — M25511 Pain in right shoulder: Secondary | ICD-10-CM

## 2016-01-21 MED ORDER — PANTOPRAZOLE SODIUM 40 MG PO TBEC: 40.0000 mg | DELAYED_RELEASE_TABLET | Freq: Every day | ORAL | Status: DC

## 2016-01-21 MED ORDER — GABAPENTIN 300 MG PO CAPS: 300.0000 mg | ORAL_CAPSULE | Freq: Every day | ORAL | Status: AC
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
## 2016-01-22 ENCOUNTER — Ambulatory Visit: Payer: Medicare Other

## 2016-01-22 ENCOUNTER — Ambulatory Visit: Payer: Self-pay | Admitting: Physical Therapy

## 2016-01-23 ENCOUNTER — Telehealth: Payer: Self-pay | Admitting: Family Medicine

## 2016-01-23 ENCOUNTER — Encounter: Payer: Self-pay | Admitting: Family Medicine

## 2016-01-23 ENCOUNTER — Ambulatory Visit: Payer: Medicare Other | Admitting: Occupational Therapy

## 2016-01-23 ENCOUNTER — Encounter: Payer: Self-pay | Admitting: Occupational Therapy

## 2016-01-23 ENCOUNTER — Other Ambulatory Visit: Payer: Self-pay | Admitting: Family Medicine

## 2016-01-23 DIAGNOSIS — M25511 Pain in right shoulder: Secondary | ICD-10-CM

## 2016-01-23 DIAGNOSIS — G8191 Hemiplegia, unspecified affecting right dominant side: Secondary | ICD-10-CM

## 2016-01-23 DIAGNOSIS — R2689 Other abnormalities of gait and mobility: Secondary | ICD-10-CM | POA: Diagnosis not present

## 2016-01-23 LAB — IRON: Iron: 15 ug/dL — ABNORMAL LOW (ref 40–190)

## 2016-01-23 LAB — VITAMIN B12: VITAMIN B 12: 393 pg/mL (ref 200–1100)

## 2016-01-23 MED ORDER — PANTOPRAZOLE SODIUM 40 MG PO TBEC: 40.0000 mg | DELAYED_RELEASE_TABLET | Freq: Every day | ORAL | Status: AC

## 2016-01-23 NOTE — Telephone Encounter (Signed)
Orders, notes and ins information faxed to [facility].

## 2016-01-23 NOTE — Telephone Encounter (Signed)
-----   Message from Donita BrooksWarren T Pickard, MD sent at 01/17/2016  4:55 PM EDT ----- Can we please help arrange the patient for a power wheelchair/power scooter.  I will be glad to fill out any form she needs. We may need to contact the scooter store.
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
## 2016-01-23 NOTE — Patient Instructions (Signed)
SPLINT WEAR AND CARE:    Your Splint This splint should initially be fitted by a healthcare practitioner.  The healthcare practitioner is responsible for providing wearing instructions and precautions to the patient, other healthcare practitioners and care provider involved in the patient's care.  This splint was custom made for you. Please read the following instructions to learn about wearing and caring for your splint.  Precautions Should your splint cause any of the following problems, remove the splint immediately and contact your therapist/physician.  Swelling  Severe Pain  Pressure Areas  Stiffness  Numbness  Do not wear your splint while operating machinery unless it has been fabricated for that purpose.  When To Wear Your Splint Where your splint according to your therapist/physician instructions. Nights and rest periods only (build up tolerance to 5 hrs consecutively without problems over the next couple days during awake time first before switching more to night)   Care and Cleaning of Your Splint 1. Keep your splint away from open flames. 2. Your splint will lose its shape in temperatures over 135 degrees Farenheit, ( in car windows, near radiators, ovens or in hot water).  Never make any adjustments to your splint, if the splint needs adjusting remove it and make an appointment to see your therapist. 3. Your splint, may be cleaned with soap and rubbing alcohol.  Do not immerse in hot water over 135 degrees Farenheit. 4. Straps may be washed with soap and water, but do not moisten the self-adhesive portion.

## 2016-01-24 ENCOUNTER — Ambulatory Visit: Payer: Medicare Other

## 2016-01-24 DIAGNOSIS — G8191 Hemiplegia, unspecified affecting right dominant side: Secondary | ICD-10-CM

## 2016-01-24 DIAGNOSIS — R2689 Other abnormalities of gait and mobility: Secondary | ICD-10-CM
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
## 2016-01-24 NOTE — Patient Instructions (Signed)
Perform at kitchen sink, with hands on sink for support as needed. Have wheelchair locked behind you for safety. Perform with somebody there with you.  Feet Apart, Varied Arm Positions - Eyes Open    With eyes open, feet shoulder width apart, arms at your side, look straight ahead at a stationary object. Hold __30__ seconds. Repeat __3__ times per session. Do __1__ sessions per day.  Next, place hands on sink and turn and look behind you to the right side and then turn and look behind you to the left side. Copyright  VHI. All rights reserved.

## 2016-01-26 ENCOUNTER — Encounter: Payer: Self-pay | Admitting: Family Medicine

## 2016-01-27 ENCOUNTER — Encounter: Payer: Self-pay | Admitting: Family Medicine

## 2016-01-28 ENCOUNTER — Ambulatory Visit: Payer: Medicare Other

## 2016-01-28 ENCOUNTER — Ambulatory Visit: Payer: Medicare Other | Admitting: Occupational Therapy

## 2016-01-28 ENCOUNTER — Encounter: Payer: Self-pay | Admitting: Occupational Therapy

## 2016-01-28 DIAGNOSIS — R2689 Other abnormalities of gait and mobility: Secondary | ICD-10-CM | POA: Diagnosis not present

## 2016-01-28 DIAGNOSIS — R208 Other disturbances of skin sensation: Secondary | ICD-10-CM

## 2016-01-28 DIAGNOSIS — R41840 Attention and concentration deficit: Secondary | ICD-10-CM

## 2016-01-28 DIAGNOSIS — G8191 Hemiplegia, unspecified affecting right dominant side: Secondary | ICD-10-CM

## 2016-01-28 DIAGNOSIS — M25511 Pain in right shoulder: Secondary | ICD-10-CM
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
## 2016-01-28 NOTE — Patient Instructions (Signed)
Improving safety and independence in showering:  1. Put tub bench as low as it goes.  This will hopefully allow Stanton KidneyDebra to get her feet on the floor of the tub when she showers. IF you can't get it low enough, then have her use the long handled sponge to wash her feet.  2. Switch tub bench to the other end of the tub to hopefully allow for a squat pivot transfer into and out of the tub.  You will need to call [facility] and have them switch the seat around for you.  This will make it safer and allow Stanton KidneyDebra to be more independent.  3. Have Stanton KidneyDebra put washcloth on her left leg and run her left arm over the washcloth.  She can get most of her arm when she leans forward.  She can then use her left hand to wash her left shoulder and under her left arm pit.    4 Have her use her long handled sponge to wash her back.  These small changes will make Stanton KidneyDebra more independent.  Always stay with her as she can be impulsive and move fast and this can result in a fall.

## 2016-01-28 NOTE — Patient Instructions (Addendum)
Bridge    Lie back, legs bent. Tuck in tummy, pressing hips up.Hold for 2 seconds. Then slowly come back down.  Repeat __10__ times. Do __3__ sessions per day. Perform every other day.  http://pm.exer.us/55   Copyright  VHI. All rights reserved.   Abduction / Adduction: Controlled Motion (Supine)    Position (A) Patient: Bend right leg, knee to side. Helper: Hold one hand poised at midline. Motion (B) - Patient uses helper's hand as target to move leg into center position. - Helper guards leg with other hand. Hold _2__ seconds. Repeat _5__ times. Repeat with other leg. Do _1-2__ sessions per day. Variation: Helper's hand moves slowly and continuously from side to side as a moving target for patient to follow.  Copyright  VHI. All rights reserved.   Functional Quadriceps: Sit to Stand    Place sturdy chair in front of you for safety. Sit on edge of chair, feet flat on floor. Stand upright, extending knees fully. Repeat _10___ times per set. Do __2__ sets per session. Do __1__ sessions per day.  http://orth.exer.us/735   Copyright  VHI. All rights reserved.    Flexion: Stretch - Hamstrings (Supine)    Position (A) Helper: Stabilize hip. Place other hand under right ankle. Motion (B) -Helper lifts leg (helper can place lower leg on shoulder), keeping knee straight and foot pointing in direction of movement. -Stop at point of tension in back of thigh. -Do not allow patient's pelvis to rise off bed. CAUTION: Do not allow knee to go beyond straight. Hold _30__ seconds. Repeat _3__ times. Repeat with other leg as needed. Do __2-3_ sessions per day.   Copyright  VHI. All rights reserved.
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
## 2016-01-29 ENCOUNTER — Encounter: Payer: Self-pay | Admitting: Family Medicine

## 2016-01-30 ENCOUNTER — Encounter: Payer: Self-pay | Admitting: Speech Pathology

## 2016-01-30 ENCOUNTER — Ambulatory Visit: Payer: Medicare Other

## 2016-01-30 DIAGNOSIS — R2689 Other abnormalities of gait and mobility: Secondary | ICD-10-CM

## 2016-01-30 DIAGNOSIS — G8191 Hemiplegia, unspecified affecting right dominant side: Secondary | ICD-10-CM
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
## 2016-01-30 NOTE — Telephone Encounter (Signed)
Received confirmation that this was received and needed Power mobility seating evaluation RX - faxed to [phone]

## 2016-02-01 ENCOUNTER — Emergency Department (HOSPITAL_COMMUNITY)
Admission: EM | Admit: 2016-02-01 | Discharge: 2016-02-08 | Disposition: E | Payer: Medicare Other | Attending: Emergency Medicine | Admitting: Emergency Medicine

## 2016-02-01 DIAGNOSIS — I469 Cardiac arrest, cause unspecified: Secondary | ICD-10-CM | POA: Diagnosis present

## 2016-02-01 LAB — I-STAT CHEM 8, ED
BUN: 8 mg/dL (ref 6–20)
CHLORIDE: 112 mmol/L — AB (ref 101–111)
Calcium, Ion: 1.24 mmol/L — ABNORMAL HIGH (ref 1.12–1.23)
Creatinine, Ser: 0.7 mg/dL (ref 0.44–1.00)
Glucose, Bld: 203 mg/dL — ABNORMAL HIGH (ref 65–99)
HEMATOCRIT: 33 % — AB (ref 36.0–46.0)
Hemoglobin: 11.2 g/dL — ABNORMAL LOW (ref 12.0–15.0)
POTASSIUM: 4.9 mmol/L (ref 3.5–5.1)
SODIUM: 144 mmol/L (ref 135–145)
TCO2: 16 mmol/L (ref 0–100)

## 2016-02-01 LAB — I-STAT CG4 LACTIC ACID, ED: Lactic Acid, Venous: 11.96 mmol/L (ref 0.5–2.0)

## 2016-02-01 LAB — I-STAT TROPONIN, ED: TROPONIN I, POC: 0.28 ng/mL — AB (ref 0.00–0.08)

## 2016-02-01 LAB — CBG MONITORING, ED: Glucose-Capillary: 188 mg/dL — ABNORMAL HIGH (ref 65–99)

## 2016-02-01 MED ORDER — SODIUM CHLORIDE 0.9 % IV SOLN
INTRAVENOUS | Status: AC | PRN
  Administered 2016-02-01: 1000 mL via INTRAVENOUS

## 2016-02-01 MED ORDER — EPINEPHRINE HCL 0.1 MG/ML IJ SOSY
PREFILLED_SYRINGE | INTRAMUSCULAR | Status: AC | PRN
  Administered 2016-02-01 (×3): 1 mg via INTRAVENOUS

## 2016-02-01 MED ORDER — DEXTROSE 50 % IV SOLN
INTRAVENOUS | Status: AC | PRN
  Administered 2016-02-01: 1 via INTRAVENOUS

## 2016-02-01 MED ORDER — NALOXONE HCL 2 MG/2ML IJ SOSY
PREFILLED_SYRINGE | INTRAMUSCULAR | Status: AC | PRN
  Administered 2016-02-01: 2 mg via INTRAVENOUS

## 2016-02-02 NOTE — Progress Notes (Signed)
   02/02/16 0000  Clinical Encounter Type  Visited With Family;Patient not available  Visit Type Death;ED;Critical Care  Referral From Nurse;Physician  Spiritual Encounters  Spiritual Needs Emotional;Grief support;Prayer  Ch responded to request to offer grief, spiritual and emotional support for family of pt; CH with MD for notification of pt death; Ch offered grief support and prayer for family. Erline LevineMichael I Raenell Mensing 12:07 AM

## 2016-02-03 ENCOUNTER — Ambulatory Visit: Payer: Medicare Other | Admitting: Occupational Therapy

## 2016-02-03 ENCOUNTER — Ambulatory Visit: Payer: Medicare Other | Admitting: Physical Therapy

## 2016-02-03 MED FILL — Medication: Qty: 1 | Status: AC

## 2016-02-04 ENCOUNTER — Ambulatory Visit: Payer: Medicare Other | Admitting: Occupational Therapy

## 2016-02-08 NOTE — ED Notes (Signed)
Pt's CGB result was 188. Informed Dr. Criss AlvineGoldston and Gabriel RungMonique - RN.

## 2016-02-08 NOTE — Code Documentation (Signed)
Patient time of death occurred at 2257 by [facility]geyecare IncDr.Goldston

## 2016-02-08 NOTE — ED Notes (Signed)
Per EMS call was placed due to pt having seizure like activity while on the toilet; Pt had 15 sec of seizure while EMS was there; En route pt a&ox 4 and stated she had sever back pain and 3rd seizure occurred; pt HR dropped en route and pt became unresponsive and and CPR was started; pt intubated prior to arrival and givn 2 of EPI en route; EMS states pupils 9 and fixed prior to arrival.
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
## 2016-02-08 DEATH — deceased

## 2016-02-13 ENCOUNTER — Ambulatory Visit: Payer: Self-pay

## 2016-02-13 ENCOUNTER — Encounter: Payer: Self-pay | Admitting: Occupational Therapy

## 2016-02-14 ENCOUNTER — Ambulatory Visit: Payer: Self-pay

## 2016-02-18 ENCOUNTER — Ambulatory Visit: Payer: Self-pay | Admitting: Physical Therapy

## 2016-02-18 ENCOUNTER — Encounter: Payer: Self-pay | Admitting: Occupational Therapy

## 2016-02-20 ENCOUNTER — Ambulatory Visit: Payer: Self-pay

## 2016-02-20 ENCOUNTER — Encounter: Payer: Self-pay | Admitting: Occupational Therapy

## 2016-02-25 ENCOUNTER — Encounter: Payer: Self-pay | Admitting: Speech Pathology

## 2016-02-25 ENCOUNTER — Encounter: Payer: Self-pay | Admitting: Occupational Therapy

## 2016-02-25 ENCOUNTER — Ambulatory Visit: Payer: Self-pay

## 2016-02-27 ENCOUNTER — Ambulatory Visit: Payer: Self-pay | Admitting: Physical Therapy

## 2016-02-27 ENCOUNTER — Encounter: Payer: Self-pay | Admitting: Speech Pathology

## 2016-02-27 ENCOUNTER — Encounter: Payer: Self-pay | Admitting: Occupational Therapy

## 2016-03-03 ENCOUNTER — Encounter: Payer: Self-pay | Admitting: Speech Pathology

## 2016-03-03 ENCOUNTER — Ambulatory Visit: Payer: Self-pay | Admitting: Physical Therapy

## 2016-03-03 ENCOUNTER — Encounter: Payer: Self-pay | Admitting: Occupational Therapy

## 2016-03-04 ENCOUNTER — Ambulatory Visit: Payer: Self-pay | Admitting: Physical Medicine & Rehabilitation

## 2016-03-05 ENCOUNTER — Encounter: Payer: Self-pay | Admitting: Occupational Therapy

## 2016-03-05 ENCOUNTER — Ambulatory Visit: Payer: Self-pay

## 2016-03-10 ENCOUNTER — Ambulatory Visit: Payer: Self-pay

## 2016-03-12 ENCOUNTER — Ambulatory Visit: Payer: Self-pay

## 2016-03-17 ENCOUNTER — Ambulatory Visit: Payer: Self-pay

## 2016-03-19 ENCOUNTER — Ambulatory Visit: Payer: Self-pay

## 2016-05-15 ENCOUNTER — Encounter (HOSPITAL_COMMUNITY): Payer: Self-pay

## 2016-06-29 ENCOUNTER — Encounter: Payer: Self-pay | Admitting: Occupational Therapy

## 2016-06-29 DIAGNOSIS — M25511 Pain in right shoulder: Secondary | ICD-10-CM
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
## 2017-02-05 IMAGING — CR DG ABD PORTABLE 1V
1 series · 1 of 1 positions shown · non-contrast
Comparison: Abdominal radiograph 12/06/2015.

CLINICAL DATA: 34-year-old female with impaired oral gastric
feeding tube.

EXAM:
PORTABLE ABDOMEN - 1 VIEW

[AP]
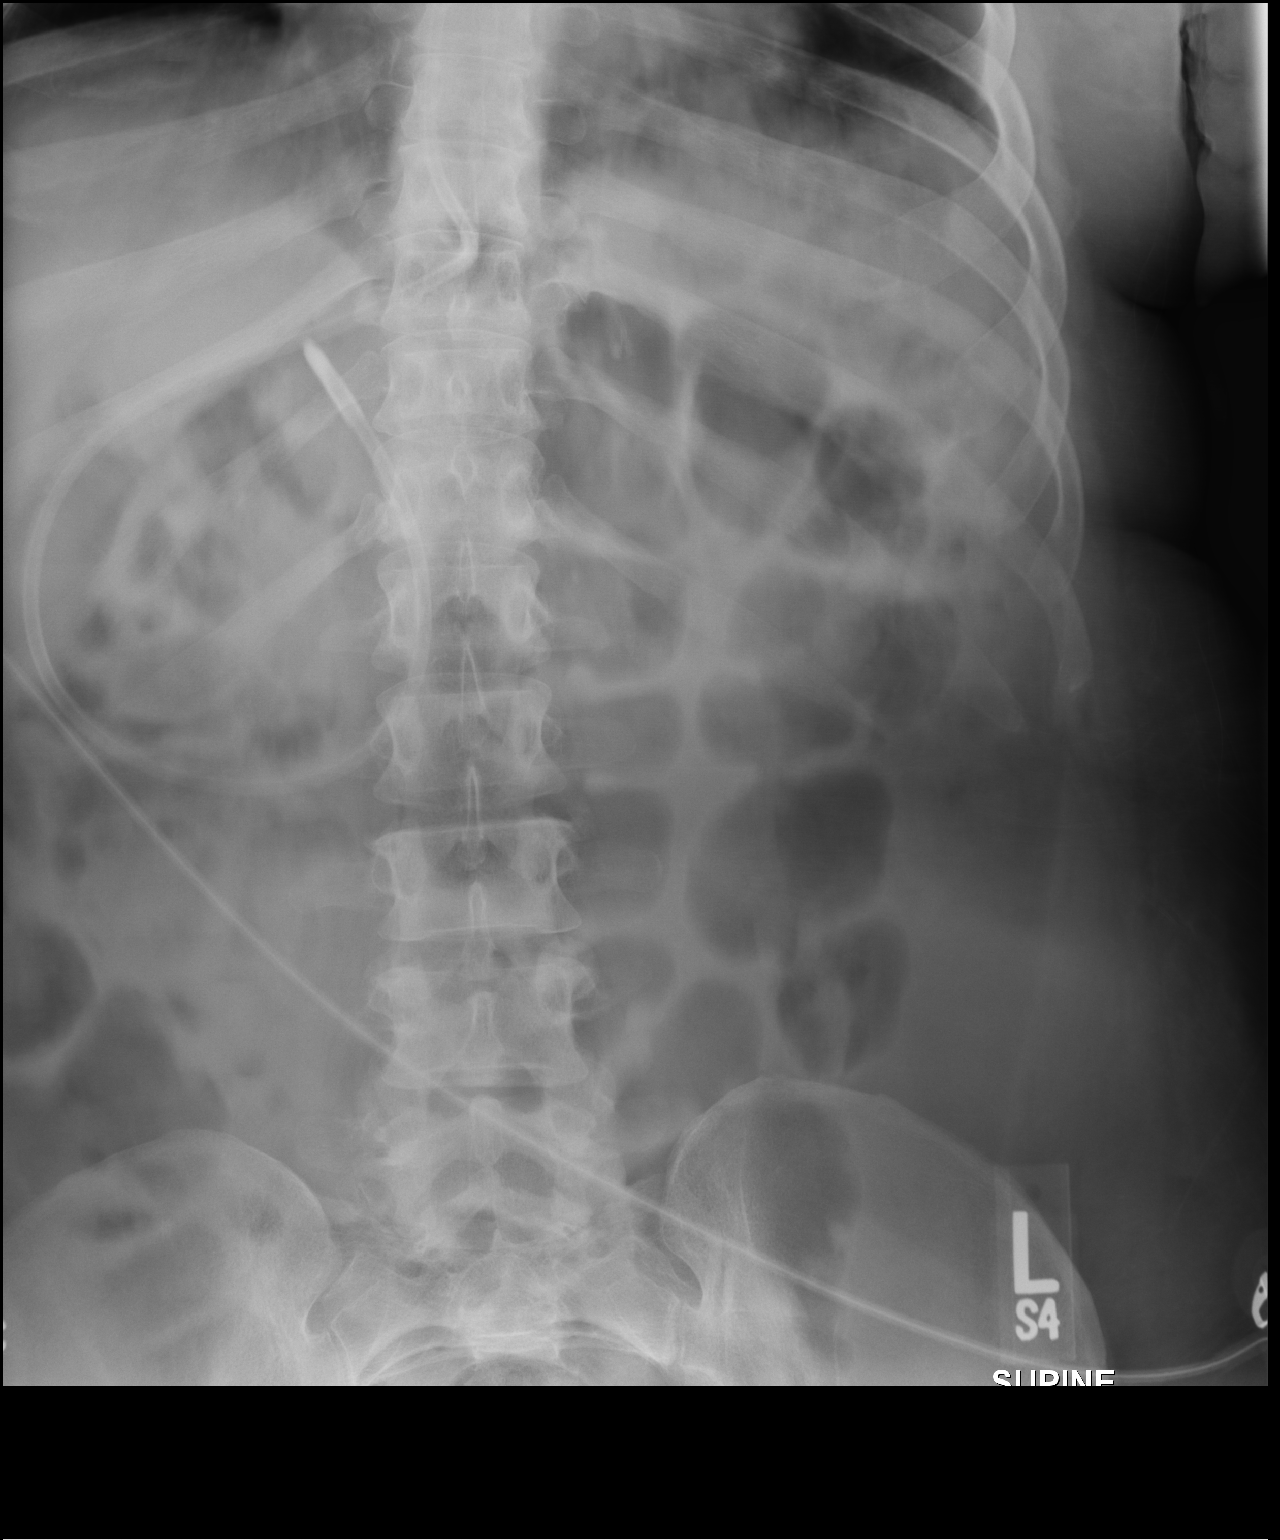

[1 of 1 positions shown; findings below may reference images not displayed]

FINDINGS: Feeding tube appears coiled in the proximal stomach. Visualized
bowel gas pattern is nonobstructive.
IMPRESSION: Tip of feeding tube is in the proximal stomach.
